# Patient Record
Sex: Female | Born: 1935 | Race: Black or African American | Hispanic: No | Marital: Single | Smoking: Former smoker
Health system: Southern US, Community
[De-identification: ages and names within clinical notes are randomized; demographics above are authoritative.]

## PROBLEM LIST (undated history)

## (undated) DIAGNOSIS — I10 Essential (primary) hypertension: Secondary | ICD-10-CM

## (undated) DIAGNOSIS — G8929 Other chronic pain: Secondary | ICD-10-CM

## (undated) DIAGNOSIS — E119 Type 2 diabetes mellitus without complications: Secondary | ICD-10-CM

## (undated) DIAGNOSIS — M109 Gout, unspecified: Secondary | ICD-10-CM

## (undated) DIAGNOSIS — M549 Dorsalgia, unspecified: Secondary | ICD-10-CM

## (undated) DIAGNOSIS — I219 Acute myocardial infarction, unspecified: Secondary | ICD-10-CM

## (undated) DIAGNOSIS — R262 Difficulty in walking, not elsewhere classified: Secondary | ICD-10-CM

## (undated) DIAGNOSIS — F259 Schizoaffective disorder, unspecified: Secondary | ICD-10-CM

## (undated) DIAGNOSIS — F319 Bipolar disorder, unspecified: Secondary | ICD-10-CM

## (undated) DIAGNOSIS — IMO0002 Reserved for concepts with insufficient information to code with codable children: Secondary | ICD-10-CM

## (undated) HISTORY — PX: ABDOMINAL HYSTERECTOMY: SHX81

---

## 2016-10-05 ENCOUNTER — Emergency Department (HOSPITAL_COMMUNITY): Payer: Medicare PPO

## 2016-10-05 ENCOUNTER — Encounter (HOSPITAL_COMMUNITY): Payer: Self-pay

## 2016-10-05 ENCOUNTER — Emergency Department (HOSPITAL_COMMUNITY)
Admission: EM | Admit: 2016-10-05 | Discharge: 2016-10-05 | Disposition: A | Discharge status: Home / Self Care | Payer: Medicare PPO | Attending: Emergency Medicine | Admitting: Emergency Medicine

## 2016-10-05 DIAGNOSIS — I10 Essential (primary) hypertension: Secondary | ICD-10-CM | POA: Insufficient documentation

## 2016-10-05 DIAGNOSIS — Z87891 Personal history of nicotine dependence: Secondary | ICD-10-CM | POA: Insufficient documentation

## 2016-10-05 DIAGNOSIS — E875 Hyperkalemia: Secondary | ICD-10-CM | POA: Diagnosis not present

## 2016-10-05 DIAGNOSIS — N390 Urinary tract infection, site not specified: Secondary | ICD-10-CM | POA: Insufficient documentation

## 2016-10-05 DIAGNOSIS — E119 Type 2 diabetes mellitus without complications: Secondary | ICD-10-CM | POA: Diagnosis not present

## 2016-10-05 DIAGNOSIS — J181 Lobar pneumonia, unspecified organism: Secondary | ICD-10-CM | POA: Insufficient documentation

## 2016-10-05 DIAGNOSIS — J189 Pneumonia, unspecified organism: Secondary | ICD-10-CM

## 2016-10-05 DIAGNOSIS — R1030 Lower abdominal pain, unspecified: Secondary | ICD-10-CM

## 2016-10-05 HISTORY — DX: Gout, unspecified: M10.9

## 2016-10-05 HISTORY — DX: Acute myocardial infarction, unspecified: I21.9

## 2016-10-05 HISTORY — DX: Essential (primary) hypertension: I10

## 2016-10-05 HISTORY — DX: Type 2 diabetes mellitus without complications: E11.9

## 2016-10-05 HISTORY — DX: Reserved for concepts with insufficient information to code with codable children: IMO0002

## 2016-10-05 LAB — CBC WITH DIFFERENTIAL/PLATELET
BASOS ABS: 0 10*3/uL (ref 0.0–0.1)
BASOS PCT: 0 %
EOS PCT: 3 %
Eosinophils Absolute: 0.2 10*3/uL (ref 0.0–0.7)
HCT: 32.6 % — ABNORMAL LOW (ref 36.0–46.0)
Hemoglobin: 10.2 g/dL — ABNORMAL LOW (ref 12.0–15.0)
Lymphocytes Relative: 32 %
Lymphs Abs: 1.9 10*3/uL (ref 0.7–4.0)
MCH: 30.4 pg (ref 26.0–34.0)
MCHC: 31.3 g/dL (ref 30.0–36.0)
MCV: 97.3 fL (ref 78.0–100.0)
MONO ABS: 0.4 10*3/uL (ref 0.1–1.0)
Monocytes Relative: 7 %
NEUTROS ABS: 3.5 10*3/uL (ref 1.7–7.7)
Neutrophils Relative %: 58 %
PLATELETS: 202 10*3/uL (ref 150–400)
RBC: 3.35 MIL/uL — ABNORMAL LOW (ref 3.87–5.11)
RDW: 13.4 % (ref 11.5–15.5)
WBC: 6 10*3/uL (ref 4.0–10.5)

## 2016-10-05 LAB — COMPREHENSIVE METABOLIC PANEL
ALT: 17 U/L (ref 14–54)
AST: 13 U/L — AB (ref 15–41)
Albumin: 3.7 g/dL (ref 3.5–5.0)
Alkaline Phosphatase: 126 U/L (ref 38–126)
Anion gap: 5 (ref 5–15)
BILIRUBIN TOTAL: 0.6 mg/dL (ref 0.3–1.2)
BUN: 46 mg/dL — AB (ref 6–20)
CO2: 18 mmol/L — ABNORMAL LOW (ref 22–32)
CREATININE: 2.45 mg/dL — AB (ref 0.44–1.00)
Calcium: 9.2 mg/dL (ref 8.9–10.3)
Chloride: 115 mmol/L — ABNORMAL HIGH (ref 101–111)
GFR, EST AFRICAN AMERICAN: 20 mL/min — AB (ref >60)
GFR, EST NON AFRICAN AMERICAN: 17 mL/min — AB (ref >60)
Glucose, Bld: 148 mg/dL — ABNORMAL HIGH (ref 65–99)
POTASSIUM: 5.5 mmol/L — AB (ref 3.5–5.1)
Sodium: 138 mmol/L (ref 135–145)
TOTAL PROTEIN: 7.1 g/dL (ref 6.5–8.1)

## 2016-10-05 LAB — LIPASE, BLOOD: LIPASE: 31 U/L (ref 11–51)

## 2016-10-05 LAB — URINE MICROSCOPIC-ADD ON: RBC / HPF: NONE SEEN RBC/hpf (ref 0–5)

## 2016-10-05 LAB — URINALYSIS, ROUTINE W REFLEX MICROSCOPIC
BILIRUBIN URINE: NEGATIVE
Glucose, UA: NEGATIVE mg/dL
Hgb urine dipstick: NEGATIVE
KETONES UR: NEGATIVE mg/dL
NITRITE: NEGATIVE
PROTEIN: 100 mg/dL — AB
Specific Gravity, Urine: 1.02 (ref 1.005–1.030)
pH: 6 (ref 5.0–8.0)

## 2016-10-05 LAB — TROPONIN I

## 2016-10-05 MED ORDER — CEPHALEXIN 250 MG PO CAPS: 250.0000 mg | ORAL_CAPSULE | Freq: Three times a day (TID) | ORAL | 0 refills | Status: DC

## 2016-10-05 MED ORDER — AZITHROMYCIN 500 MG IV SOLR
500.0000 mg | Freq: Once | INTRAVENOUS | Status: AC
  Administered 2016-10-05: 500 mg via INTRAVENOUS
  Filled 2016-10-05: qty 500

## 2016-10-05 MED ORDER — DEXTROSE 5 % IV SOLN
1.0000 g | Freq: Once | INTRAVENOUS | Status: AC
  Administered 2016-10-05: 1 g via INTRAVENOUS
  Filled 2016-10-05: qty 10

## 2016-10-05 MED ORDER — SODIUM CHLORIDE 0.9 % IV BOLUS (SEPSIS)
1000.0000 mL | Freq: Once | INTRAVENOUS | Status: AC
  Administered 2016-10-05: 1000 mL via INTRAVENOUS

## 2016-10-05 MED ORDER — MORPHINE SULFATE (PF) 4 MG/ML IV SOLN
4.0000 mg | Freq: Once | INTRAVENOUS | Status: AC
  Administered 2016-10-05: 4 mg via INTRAVENOUS
  Filled 2016-10-05: qty 1

## 2016-10-05 MED ORDER — ONDANSETRON HCL 4 MG/2ML IJ SOLN
4.0000 mg | Freq: Once | INTRAMUSCULAR | Status: AC
  Administered 2016-10-05: 4 mg via INTRAVENOUS
  Filled 2016-10-05: qty 2

## 2016-10-05 MED ORDER — AZITHROMYCIN 250 MG PO TABS: 250.0000 mg | ORAL_TABLET | Freq: Every day | ORAL | 0 refills | Status: DC

## 2016-10-05 MED ORDER — HYDROCODONE-ACETAMINOPHEN 5-325 MG PO TABS: 1.0000 | ORAL_TABLET | ORAL | 0 refills | Status: DC | PRN

## 2016-10-05 MED ORDER — IOPAMIDOL (ISOVUE-300) INJECTION 61%
INTRAVENOUS | Status: AC
  Filled 2016-10-05: qty 30

## 2016-10-05 NOTE — ED Notes (Signed)
EKG given to Dr. Goldston  

## 2016-10-05 NOTE — ED Triage Notes (Signed)
Pt sent from [facility] due to lower abdominal pain that started last night. Pt complains of N/V/D. Also diagnosed with strep throat. Pain has radiated to left chest

## 2016-10-05 NOTE — ED Notes (Signed)
Pt to CT at this time.

## 2016-10-05 NOTE — ED Provider Notes (Signed)
[place] DEPT Provider Note   CSN: [id number] Arrival date & time: 10/05/16  1419     History   Chief Complaint Chief Complaint  Patient presents with  . Abdominal Pain    HPI Kristen Kaufman is a 82 y.o. female.  HPI  82 year old female with a history of diabetes and hypertension presents with lower abdominal pain since last night. When she woke up yesterday she was feeling fine but throughout the day started having mild lower abdominal pain. Her left-sided became severe and has continued throughout the day today. Went to an urgent care in [place] where they drew labs and did an x-ray with a set there was something abnormal on her right side. Center here for evaluation. She states she's also had an intermittently productive cough for about one week with fevers. She denies sore throat but states that they told her her strep test was positive. She has had nausea with one episode of vomiting. Multiple episodes of loose watery stool. No urinary symptoms. Occasionally when the pain is bad it radiates from her abdomen up to her chest, no pain there now. Starts on left side and wraps around anterior abdomen  Past Medical History:  Diagnosis Date  . Diabetes mellitus without complication ([place])   . Gout   . Hypertension   . Myocardial infarct   . Ulcer ([place])    stomach    There are no active problems to display for this patient.   Past Surgical History:  Procedure Laterality Date  . ABDOMINAL HYSTERECTOMY      OB History    No data available       Home Medications    Prior to Admission medications   Medication Sig Start Date End Date Taking? Authorizing Provider  LANTUS 100 UNIT/ML injection Inject 50 Units into the skin at bedtime.  09/28/16  Yes Historical Provider, MD  naproxen sodium (ANAPROX) 220 MG tablet Take 220 mg by mouth daily as needed (pain).   Yes Historical Provider, MD  azithromycin (ZITHROMAX) 250 MG tablet Take 1 tablet (250 mg total) by mouth daily.  Take first 2 tablets together, then 1 every day until finished. 10/05/16   Sherwood Gambler, MD  cephALEXin (KEFLEX) 250 MG capsule Take 1 capsule (250 mg total) by mouth 3 (three) times daily. 10/05/16   Sherwood Gambler, MD  HYDROcodone-acetaminophen (NORCO) 5-325 MG tablet Take 1-2 tablets by mouth every 4 (four) hours as needed. 10/05/16   Sherwood Gambler, MD    Family History No family history on file.  Social History Social History  Substance Use Topics  . Smoking status: Former Smoker    Quit date: 10/05/2006  . Smokeless tobacco: Never Used  . Alcohol use No     Allergies   Menactra [meningococcal a c y&w-135 conj]   Review of Systems Review of Systems  Constitutional: Positive for fever.  Respiratory: Positive for cough.   Cardiovascular: Positive for chest pain.  Gastrointestinal: Positive for abdominal pain, diarrhea, nausea and vomiting. Negative for blood in stool.  Genitourinary: Negative for dysuria.  Musculoskeletal: Positive for back pain (chronic).  All other systems reviewed and are negative.    Physical Exam Updated Vital Signs BP 163/78 (BP Location: Right Arm)   Pulse 64   Temp 97.4 F (36.3 C) (Oral)   Resp 16   Ht 5\' 1"  (1.549 m)   Wt 180 lb (81.6 kg)   SpO2 98%   BMI 34.01 kg/m   Physical Exam  Constitutional: She is  oriented to person, place, and time. She appears well-developed and well-nourished. No distress.  Appears in pain  HENT:  Head: Normocephalic and atraumatic.  Right Ear: External ear normal.  Left Ear: External ear normal.  Nose: Nose normal.  Eyes: Right eye exhibits no discharge. Left eye exhibits no discharge.  Cardiovascular: Normal rate, regular rhythm and normal heart sounds.   Pulmonary/Chest: Effort normal and breath sounds normal. She has no wheezes. She has no rales.  Abdominal: Soft. There is tenderness (generalized, mostly LLQ).  Neurological: She is alert and oriented to person, place, and time.  Skin: Skin is  warm and dry. She is not diaphoretic.  Nursing note and vitals reviewed.    ED Treatments / Results  Labs (all labs ordered are listed, but only abnormal results are displayed) Labs Reviewed  COMPREHENSIVE METABOLIC PANEL - Abnormal; Notable for the following:       Result Value   Potassium 5.5 (*)    Chloride 115 (*)    CO2 18 (*)    Glucose, Bld 148 (*)    BUN 46 (*)    Creatinine, Ser 2.45 (*)    AST 13 (*)    GFR calc non Af Amer 17 (*)    GFR calc Af Amer 20 (*)    All other components within normal limits  CBC WITH DIFFERENTIAL/PLATELET - Abnormal; Notable for the following:    RBC 3.35 (*)    Hemoglobin 10.2 (*)    HCT 32.6 (*)    All other components within normal limits  URINALYSIS, ROUTINE W REFLEX MICROSCOPIC (NOT AT [facility]) - Abnormal; Notable for the following:    Protein, ur 100 (*)    Leukocytes, UA TRACE (*)    All other components within normal limits  URINE MICROSCOPIC-ADD ON - Abnormal; Notable for the following:    Squamous Epithelial / LPF 0-5 (*)    Bacteria, UA MANY (*)    All other components within normal limits  LIPASE, BLOOD  TROPONIN I    EKG  EKG Interpretation  Date/Time:  Wednesday October 05 2016 14:29:21 EDT Ventricular Rate:  74 PR Interval:    QRS Duration: 80 QT Interval:  399 QTC Calculation: 443 R Axis:   -16 Text Interpretation:  Sinus rhythm Low voltage, precordial leads Abnormal R-wave progression, early transition Left ventricular hypertrophy No old tracing to compare Confirmed by Abhinav Mayorquin MD, [place] [phone]) on 10/05/2016 2:32:31 PM       Radiology Ct Abdomen Pelvis Wo Contrast  Result Date: 10/05/2016 CLINICAL DATA:  Left lower abdominal pain which began last night. EXAM: CT ABDOMEN AND PELVIS WITHOUT CONTRAST TECHNIQUE: Multidetector CT imaging of the abdomen and pelvis was performed following the standard protocol without IV contrast. COMPARISON:  None. FINDINGS: Lower chest: Mild cardiac enlargement and trace  pericardial effusion. No pleural effusion identified. Hepatobiliary: The liver as a heterogeneous appearance. No focal liver abnormality identified. Gallbladder is normal. No biliary dilatation. Pancreas: Unremarkable. No pancreatic ductal dilatation or surrounding inflammatory changes. Spleen: The spleen is unremarkable. Adrenals/Urinary Tract: Normal appearance of the adrenal glands. Fluid density structure arising from the upper pole of the left kidney is identified measuring 1.5 cm. Although likely representing a simple cysts this is incompletely characterized without IV contrast. No mass or hydronephrosis identified. Gas identified within the nondependent portion of the urinary bladder. This may be related to instrumentation. Stomach/Bowel: The stomach is normal. The small bowel loops have a normal course and caliber. No bowel obstruction. Scattered colonic diverticula  noted. Vascular/Lymphatic: Aortic atherosclerosis noted. No aneurysm. Adjacent to the left renal vein is an indeterminate soft tissue attenuating structure which measures 23 mm and 25 HU. No pelvic or inguinal adenopathy. Reproductive: Status post hysterectomy. No adnexal masses. Other: There is no ascites or focal fluid collections within the abdomen or pelvis. Musculoskeletal: Degenerative disc disease identified within the lumbar spine. No aggressive lytic or sclerotic bone lesions. IMPRESSION: 1. No acute findings identified within the abdomen or pelvis. 2. Indeterminate soft tissue attenuating structure within the left upper quadrant of the abdomen adjacent to the left renal vein. This may represent a soft tissue mass or adenopathy. Further investigation with contrast enhanced MRI or CT of the upper abdomen. 3. Aortic atherosclerosis. 4. Colonic diverticula without acute inflammation. Electronically Signed   By: Kerby Moors M.D.   On: 10/05/2016 17:50   Dg Chest 2 View  Result Date: 10/05/2016 CLINICAL DATA:  Cough for 1 week EXAM:  CHEST  2 VIEW COMPARISON:  None. FINDINGS: Cardiomegaly is noted. No pulmonary edema. There is streaky left base retrocardiac atelectasis or infiltrate best seen on lateral view. Mild degenerative changes thoracic spine. IMPRESSION: No pulmonary edema. Streaky left base retrocardiac atelectasis or infiltrate best seen on lateral view. Degenerative changes thoracic spine. Electronically Signed   By: Lahoma Crocker M.D.   On: 10/05/2016 16:52    Procedures Procedures (including critical care time)  Medications Ordered in ED Medications  iopamidol (ISOVUE-300) 61 % injection (not administered)  sodium chloride 0.9 % bolus 1,000 mL (0 mLs Intravenous Stopped 10/05/16 1705)  ondansetron (ZOFRAN) injection 4 mg (4 mg Intravenous Given 10/05/16 1455)  morphine 4 MG/ML injection 4 mg (4 mg Intravenous Given 10/05/16 1456)  cefTRIAXone (ROCEPHIN) 1 g in dextrose 5 % 50 mL IVPB (0 g Intravenous Stopped 10/05/16 1921)  azithromycin (ZITHROMAX) 500 mg in dextrose 5 % 250 mL IVPB (0 mg Intravenous Stopped 10/05/16 2027)     Initial Impression / Assessment and Plan / ED Course  I have reviewed the triage vital signs and the nursing notes.  Pertinent labs & imaging results that were available during my care of the patient were reviewed by me and considered in my medical decision making (see chart for details).  Clinical Course  Comment By Time  Will get labs, CT, IV morphine, zofran and re-eval. Will need to r/o diverticulitis given LLQ pain. Sherwood Gambler, MD 10/18 1447  Pain is improved with morphine. Much more comfortable. She does not know if she has CKD but I called the urgent care she saw today and they tell me in august her Cr was 2.4 and in May it was 2.4. Sherwood Gambler, MD 10/18 1654    CT is unremarkable for acute pathology. Urine is consistent with UTI and given her lower abdominal pain and subjective fever, will treat with antibiotics. Given her cough and x-ray findings, cover for pneumonia.  While Levaquin could probably cover both, given her renal disease and age I don't think this be the right choice. Thus we'll do 2 different antibiotics. She has mild hyperkalemia of 5.5, it was 5.8 earlier in the day. At this point there are no ECG changes. I think it is reasonable to have her follow-up closely with her PCP for a recheck. She is not on any current medicines that would increase her potassium. Discussed strict return precautions. Her pain is significant only better she feels comfortable going home.  Final Clinical Impressions(s) / ED Diagnoses   Final diagnoses:  Community  acquired pneumonia of left lower lobe of lung ([place])  Acute lower UTI  Lower abdominal pain  Hyperkalemia    New Prescriptions Discharge Medication List as of 10/05/2016  6:21 PM    START taking these medications   Details  azithromycin (ZITHROMAX) 250 MG tablet Take 1 tablet (250 mg total) by mouth daily. Take first 2 tablets together, then 1 every day until finished., Starting Wed 10/05/2016, Print    cephALEXin (KEFLEX) 250 MG capsule Take 1 capsule (250 mg total) by mouth 3 (three) times daily., Starting Wed 10/05/2016, Print    HYDROcodone-acetaminophen (NORCO) 5-325 MG tablet Take 1-2 tablets by mouth every 4 (four) hours as needed., Starting Wed 10/05/2016, Print         Sherwood Gambler, MD 10/05/16 2126

## 2016-10-05 NOTE — ED Notes (Signed)
Pt unable to provide urine specimen.  

## 2017-05-24 ENCOUNTER — Encounter (HOSPITAL_COMMUNITY): Payer: Self-pay | Admitting: Emergency Medicine

## 2017-05-24 ENCOUNTER — Observation Stay (HOSPITAL_COMMUNITY)
Admission: EM | Admit: 2017-05-24 | Discharge: 2017-05-27 | Disposition: A | Discharge status: Home / Self Care | Payer: Medicare PPO | Attending: Internal Medicine | Admitting: Internal Medicine

## 2017-05-24 DIAGNOSIS — D631 Anemia in chronic kidney disease: Secondary | ICD-10-CM | POA: Diagnosis not present

## 2017-05-24 DIAGNOSIS — N184 Chronic kidney disease, stage 4 (severe): Secondary | ICD-10-CM | POA: Diagnosis not present

## 2017-05-24 DIAGNOSIS — E1122 Type 2 diabetes mellitus with diabetic chronic kidney disease: Secondary | ICD-10-CM | POA: Diagnosis not present

## 2017-05-24 DIAGNOSIS — N189 Chronic kidney disease, unspecified: Secondary | ICD-10-CM

## 2017-05-24 DIAGNOSIS — Z79899 Other long term (current) drug therapy: Secondary | ICD-10-CM | POA: Diagnosis not present

## 2017-05-24 DIAGNOSIS — Z794 Long term (current) use of insulin: Secondary | ICD-10-CM | POA: Insufficient documentation

## 2017-05-24 DIAGNOSIS — E1169 Type 2 diabetes mellitus with other specified complication: Secondary | ICD-10-CM | POA: Diagnosis present

## 2017-05-24 DIAGNOSIS — N289 Disorder of kidney and ureter, unspecified: Secondary | ICD-10-CM

## 2017-05-24 DIAGNOSIS — N390 Urinary tract infection, site not specified: Secondary | ICD-10-CM

## 2017-05-24 DIAGNOSIS — I129 Hypertensive chronic kidney disease with stage 1 through stage 4 chronic kidney disease, or unspecified chronic kidney disease: Secondary | ICD-10-CM | POA: Diagnosis not present

## 2017-05-24 DIAGNOSIS — E669 Obesity, unspecified: Secondary | ICD-10-CM

## 2017-05-24 DIAGNOSIS — Z87891 Personal history of nicotine dependence: Secondary | ICD-10-CM | POA: Diagnosis not present

## 2017-05-24 DIAGNOSIS — R799 Abnormal finding of blood chemistry, unspecified: Secondary | ICD-10-CM | POA: Diagnosis present

## 2017-05-24 DIAGNOSIS — E119 Type 2 diabetes mellitus without complications: Secondary | ICD-10-CM | POA: Diagnosis present

## 2017-05-24 DIAGNOSIS — N179 Acute kidney failure, unspecified: Secondary | ICD-10-CM

## 2017-05-24 DIAGNOSIS — R0600 Dyspnea, unspecified: Secondary | ICD-10-CM

## 2017-05-24 DIAGNOSIS — E875 Hyperkalemia: Secondary | ICD-10-CM

## 2017-05-24 DIAGNOSIS — I10 Essential (primary) hypertension: Secondary | ICD-10-CM | POA: Diagnosis present

## 2017-05-24 HISTORY — DX: Dorsalgia, unspecified: M54.9

## 2017-05-24 HISTORY — DX: Other chronic pain: G89.29

## 2017-05-24 LAB — CBC WITH DIFFERENTIAL/PLATELET
BASOS ABS: 0 10*3/uL (ref 0.0–0.1)
Basophils Relative: 0 %
Eosinophils Absolute: 0.2 10*3/uL (ref 0.0–0.7)
Eosinophils Relative: 3 %
HCT: 30.3 % — ABNORMAL LOW (ref 36.0–46.0)
HEMOGLOBIN: 9.8 g/dL — AB (ref 12.0–15.0)
LYMPHS ABS: 2.1 10*3/uL (ref 0.7–4.0)
LYMPHS PCT: 31 %
MCH: 30.9 pg (ref 26.0–34.0)
MCHC: 32.3 g/dL (ref 30.0–36.0)
MCV: 95.6 fL (ref 78.0–100.0)
Monocytes Absolute: 0.6 10*3/uL (ref 0.1–1.0)
Monocytes Relative: 8 %
NEUTROS ABS: 3.9 10*3/uL (ref 1.7–7.7)
Neutrophils Relative %: 58 %
PLATELETS: 258 10*3/uL (ref 150–400)
RBC: 3.17 MIL/uL — ABNORMAL LOW (ref 3.87–5.11)
RDW: 14.3 % (ref 11.5–15.5)
WBC: 6.8 10*3/uL (ref 4.0–10.5)

## 2017-05-24 LAB — URINALYSIS, ROUTINE W REFLEX MICROSCOPIC
Bilirubin Urine: NEGATIVE
GLUCOSE, UA: NEGATIVE mg/dL
KETONES UR: NEGATIVE mg/dL
NITRITE: NEGATIVE
PH: 5 (ref 5.0–8.0)
Protein, ur: 100 mg/dL — AB
Specific Gravity, Urine: 1.011 (ref 1.005–1.030)

## 2017-05-24 LAB — GLUCOSE, CAPILLARY: Glucose-Capillary: 215 mg/dL — ABNORMAL HIGH (ref 65–99)

## 2017-05-24 LAB — BASIC METABOLIC PANEL
ANION GAP: 5 (ref 5–15)
BUN: 51 mg/dL — ABNORMAL HIGH (ref 6–20)
CHLORIDE: 111 mmol/L (ref 101–111)
CO2: 22 mmol/L (ref 22–32)
Calcium: 8.2 mg/dL — ABNORMAL LOW (ref 8.9–10.3)
Creatinine, Ser: 3.18 mg/dL — ABNORMAL HIGH (ref 0.44–1.00)
GFR calc Af Amer: 14 mL/min — ABNORMAL LOW (ref >60)
GFR, EST NON AFRICAN AMERICAN: 12 mL/min — AB (ref >60)
Glucose, Bld: 137 mg/dL — ABNORMAL HIGH (ref 65–99)
POTASSIUM: 5.5 mmol/L — AB (ref 3.5–5.1)
SODIUM: 138 mmol/L (ref 135–145)

## 2017-05-24 LAB — PHOSPHORUS: Phosphorus: 2.6 mg/dL (ref 2.5–4.6)

## 2017-05-24 MED ORDER — CALCIUM CARBONATE ANTACID 1250 MG/5ML PO SUSP
500.0000 mg | Freq: Four times a day (QID) | ORAL | Status: DC | PRN
  Filled 2017-05-24: qty 5

## 2017-05-24 MED ORDER — NEPRO/CARBSTEADY PO LIQD: 237.0000 mL | Freq: Three times a day (TID) | ORAL | Status: DC | PRN

## 2017-05-24 MED ORDER — ONDANSETRON HCL 4 MG PO TABS: 4.0000 mg | ORAL_TABLET | Freq: Four times a day (QID) | ORAL | Status: DC | PRN

## 2017-05-24 MED ORDER — DOCUSATE SODIUM 283 MG RE ENEM
1.0000 | ENEMA | RECTAL | Status: DC | PRN
  Filled 2017-05-24: qty 1

## 2017-05-24 MED ORDER — ZOLPIDEM TARTRATE 5 MG PO TABS: 5.0000 mg | ORAL_TABLET | Freq: Every evening | ORAL | Status: DC | PRN

## 2017-05-24 MED ORDER — METHOCARBAMOL 500 MG PO TABS
500.0000 mg | ORAL_TABLET | Freq: Two times a day (BID) | ORAL | Status: DC | PRN
Start: 2017-05-24 — End: 2017-05-27
  Administered 2017-05-26: 500 mg via ORAL
  Filled 2017-05-24 (×2): qty 1

## 2017-05-24 MED ORDER — HYDROXYZINE HCL 25 MG PO TABS: 25.0000 mg | ORAL_TABLET | Freq: Three times a day (TID) | ORAL | Status: DC | PRN

## 2017-05-24 MED ORDER — HYDRALAZINE HCL 20 MG/ML IJ SOLN
10.0000 mg | Freq: Once | INTRAMUSCULAR | Status: AC
  Administered 2017-05-24: 10 mg via INTRAVENOUS
  Filled 2017-05-24: qty 1

## 2017-05-24 MED ORDER — HYDRALAZINE HCL 20 MG/ML IJ SOLN: 5.0000 mg | INTRAMUSCULAR | Status: DC | PRN

## 2017-05-24 MED ORDER — ACETAMINOPHEN 325 MG PO TABS
650.0000 mg | ORAL_TABLET | Freq: Four times a day (QID) | ORAL | Status: DC | PRN
  Administered 2017-05-25: 650 mg via ORAL
  Filled 2017-05-24: qty 2

## 2017-05-24 MED ORDER — ACETAMINOPHEN 650 MG RE SUPP: 650.0000 mg | Freq: Four times a day (QID) | RECTAL | Status: DC | PRN

## 2017-05-24 MED ORDER — ENOXAPARIN SODIUM 30 MG/0.3ML ~~LOC~~ SOLN
30.0000 mg | SUBCUTANEOUS | Status: DC
  Administered 2017-05-24 – 2017-05-26 (×3): 30 mg via SUBCUTANEOUS
  Filled 2017-05-24 (×3): qty 0.3

## 2017-05-24 MED ORDER — ONDANSETRON HCL 4 MG/2ML IJ SOLN: 4.0000 mg | Freq: Four times a day (QID) | INTRAMUSCULAR | Status: DC | PRN

## 2017-05-24 MED ORDER — SORBITOL 70 % SOLN: 30.0000 mL | Status: DC | PRN

## 2017-05-24 MED ORDER — CAMPHOR-MENTHOL 0.5-0.5 % EX LOTN
1.0000 "application " | TOPICAL_LOTION | Freq: Three times a day (TID) | CUTANEOUS | Status: DC | PRN
  Filled 2017-05-24: qty 222

## 2017-05-24 MED ORDER — SODIUM CHLORIDE 0.9 % IV SOLN
INTRAVENOUS | Status: DC
  Administered 2017-05-24 – 2017-05-27 (×4): via INTRAVENOUS

## 2017-05-24 MED ORDER — DOCUSATE SODIUM 100 MG PO CAPS
100.0000 mg | ORAL_CAPSULE | Freq: Two times a day (BID) | ORAL | Status: DC
  Administered 2017-05-24 – 2017-05-27 (×5): 100 mg via ORAL
  Filled 2017-05-24 (×6): qty 1

## 2017-05-24 MED ORDER — INSULIN ASPART 100 UNIT/ML ~~LOC~~ SOLN
0.0000 [IU] | Freq: Three times a day (TID) | SUBCUTANEOUS | Status: DC
  Administered 2017-05-25: 1 [IU] via SUBCUTANEOUS

## 2017-05-24 MED ORDER — INSULIN GLARGINE 100 UNIT/ML ~~LOC~~ SOLN
50.0000 [IU] | Freq: Every day | SUBCUTANEOUS | Status: DC
  Administered 2017-05-24: 50 [IU] via SUBCUTANEOUS
  Filled 2017-05-24 (×2): qty 0.5

## 2017-05-24 MED ORDER — AMLODIPINE BESYLATE 5 MG PO TABS
10.0000 mg | ORAL_TABLET | Freq: Every day | ORAL | Status: DC
  Administered 2017-05-24 – 2017-05-27 (×4): 10 mg via ORAL
  Filled 2017-05-24 (×4): qty 2

## 2017-05-24 NOTE — ED Notes (Signed)
Call to 3A to give report.  RN will call me back

## 2017-05-24 NOTE — H&P (Addendum)
History and Physical    Kristen Kaufman CBJ:[id number] DOB: 1931/04/22 DOA: 05/24/2017  PCP: Sherrilee Gilles, DO Consultants:  Celine Ahr - orthopedics Patient coming from: home - lives with friend; NOK: friend, [phone]  Chief Complaint: sent by PCP  HPI: Kristen Kaufman is a 83 y.o. female with medical history significant of DM, HTN, and CKD presenting after her PCP sent her in because of elevated potassium and creatinine.  Patient went in for a physical and "she found all this other stuff, something wrong with".  Physical was today and the doctor sent her to the hospital after reviewing her lab work.  The patient preferred not to go to [place] or [place].  "Feeling pretty good - some days I feel good and some days I don't".  Chronic back pain.  SOB only with trying to walk fast.  Radiculopathy down left leg.  Mild LE edema.     ED Course: K+ 5.5, EKG without peaked T waves and unchanged from prior.  Creatinine 3.18 - acute vs. Chronic.  "Decision is made to admit her for optimization of her renal status."  Review of Systems: As per HPI; otherwise review of systems reviewed and negative.   Ambulatory Status:  Ambulates with a cane or walker  Past Medical History:  Diagnosis Date  . Chronic back pain   . Diabetes mellitus without complication ([place])   . Gout   . Hypertension   . Myocardial infarct ([place])   . Ulcer    stomach    Past Surgical History:  Procedure Laterality Date  . ABDOMINAL HYSTERECTOMY      Social History   Social History  . Marital status: Single    Spouse name: N/A  . Number of children: N/A  . Years of education: N/A   Occupational History  . Retired    Social History Main Topics  . Smoking status: Former Smoker    Years: 65.00    Quit date: 10/05/2006  . Smokeless tobacco: Never Used  . Alcohol use Yes     Comment: Quit in 2007 - h/o heavy use  . Drug use: No  . Sexual activity: Not on file   Other Topics Concern  . Not on file   Social History  Narrative  . No narrative on file    Allergies  Allergen Reactions  . Influenza Vaccines Anaphylaxis  . Menactra [Meningococcal A C Y&W-135 Conj]     Family History  Problem Relation Age of Onset  . Chronic Renal Failure Mother 69       required hemodialysis x 6 months    Prior to Admission medications   Medication Sig Start Date End Date Taking? Authorizing Provider  amLODipine (NORVASC) 10 MG tablet Take 10 mg by mouth daily.   Yes [provider]  LANTUS 100 UNIT/ML injection Inject 50 Units into the skin at bedtime.  09/28/16  Yes [provider]  methocarbamol (ROBAXIN) 500 MG tablet Take 500 mg by mouth 2 (two) times daily as needed for muscle spasms.   Yes [provider]    Physical Exam: Vitals:   05/24/17 1922 05/24/17 2021 05/24/17 2039 05/24/17 2050  BP: (!) 169/83   (!) 199/98  Pulse: 91   95  Resp: 20   (!) 24  Temp:    98.3 F (36.8 C)  TempSrc:    Oral  SpO2: 100%   100%  Weight:  83 kg (183 lb) 81.5 kg (179 lb 11.2 oz)   Height:  5'  2" (1.575 m)       General:  Appears calm and comfortable and is NAD Eyes:  PERRL, EOMI, normal lids, iris ENT:  grossly normal hearing, lips & tongue, mmm Neck:  no LAD, masses or thyromegaly Cardiovascular:  RRR, no m/r/g. No LE edema.  Respiratory:  CTA bilaterally, no w/r/r. Normal respiratory effort. Abdomen:  soft, ntnd, NABS Skin:  no rash or induration seen on limited exam Musculoskeletal:  grossly normal tone BUE/BLE, good ROM, no bony abnormality Psychiatric:  grossly normal mood and affect, speech fluent and appropriate, AOx3 Neurologic:  CN 2-12 grossly intact, moves all extremities in coordinated fashion, sensation intact  Labs on Admission: I have personally reviewed following labs and imaging studies  CBC:  Recent Labs Lab 05/24/17 1518  WBC 6.8  NEUTROABS 3.9  HGB 9.8*  HCT 30.3*  MCV 95.6  PLT 798   Basic Metabolic Panel:  Recent Labs Lab 05/24/17 1518  05/24/17 1523  NA 138  --   K 5.5*  --   CL 111  --   CO2 22  --   GLUCOSE 137*  --   BUN 51*  --   CREATININE 3.18*  --   CALCIUM 8.2*  --   PHOS  --  2.6   GFR: Estimated Creatinine Clearance: 13 mL/min (A) (by C-G formula based on SCr of 3.18 mg/dL (H)). Liver Function Tests: No results for input(s): AST, ALT, ALKPHOS, BILITOT, PROT, ALBUMIN in the last 168 hours. No results for input(s): LIPASE, AMYLASE in the last 168 hours. No results for input(s): AMMONIA in the last 168 hours. Coagulation Profile: No results for input(s): INR, PROTIME in the last 168 hours. Cardiac Enzymes: No results for input(s): CKTOTAL, CKMB, CKMBINDEX, TROPONINI in the last 168 hours. BNP (last 3 results) No results for input(s): PROBNP in the last 8760 hours. HbA1C: No results for input(s): HGBA1C in the last 72 hours. CBG: No results for input(s): GLUCAP in the last 168 hours. Lipid Profile: No results for input(s): CHOL, HDL, LDLCALC, TRIG, CHOLHDL, LDLDIRECT in the last 72 hours. Thyroid Function Tests: No results for input(s): TSH, T4TOTAL, FREET4, T3FREE, THYROIDAB in the last 72 hours. Anemia Panel: No results for input(s): VITAMINB12, FOLATE, FERRITIN, TIBC, IRON, RETICCTPCT in the last 72 hours. Urine analysis:    Component Value Date/Time   COLORURINE YELLOW 10/05/2016 1702   APPEARANCEUR CLEAR 10/05/2016 1702   LABSPEC 1.020 10/05/2016 1702   PHURINE 6.0 10/05/2016 1702   GLUCOSEU NEGATIVE 10/05/2016 1702   HGBUR NEGATIVE 10/05/2016 1702   BILIRUBINUR NEGATIVE 10/05/2016 1702   KETONESUR NEGATIVE 10/05/2016 1702   PROTEINUR 100 (A) 10/05/2016 1702   NITRITE NEGATIVE 10/05/2016 1702   LEUKOCYTESUR TRACE (A) 10/05/2016 1702    Creatinine Clearance: Estimated Creatinine Clearance: 13 mL/min (A) (by C-G formula based on SCr of 3.18 mg/dL (H)).  Sepsis Labs: @LABRCNTIP (procalcitonin:4,lacticidven:4) )No results found for this or any previous visit (from the past 240 hour(s)).    Radiological Exams on Admission: No results found.  EKG: pending; per Dr. Roxanne Mins, NSR with rate 75, no sctopy, no evidence of hyperkalemia.  Assessment/Plan Principal Problem:   Renal insufficiency Active Problems:   Diabetes mellitus type 2 in obese [facility])   Essential hypertension   Pertinent labs:  K+ 5.5 Glucose 137 BUN 51/Creatinine 3.18/GFR 12; prior 46/2.45/17 Hgb 9.8; prior 10.2 Phos 2.6  Renal insufficiency -Patient sent in by her PCP after having hyperkalemia (6.0) and renal dysfunction on screening labs in her office today -The  patient actually reports feeling well -In addition to the patient and her friend, I also spoke to the patient's son.  He reports a progressive decline in renal function with repeated warnings from doctors about this issue. -Based on this information, it appears that this is likely progression of CKD to now stage IV and approaching Stage V. -Will observe overnight with IVF to see if there is also an AKI component contributing. -Will monitor on telemetry due to the hyperkalemia despite no reported EKG changes. -Normal phosphorus level. -Appears to have anemia of chronic renal disease, stable. -One of the most important issues will be having the patient and her family decide where to see nephrology.  If possible, it will be best to schedule HD as close to her home as possible, and so it would be most helpful to have nephrology see her near her home.  HTN -Patient is taking Norvasc monotherapy. -She ran out of this medication on 6/2 and has not yet refilled it, which brings about concerns with her compliance. -Will add prn hydralazine IV, consider adding PO hydralazine if needed. -Maximizing HTN control as much as possible is important for maintaining renal function.  DM -Continue Lantus -Cover with SSI -Check A1c    DVT prophylaxis:  Lovenox  Code Status:  DNR - confirmed with patient/family Family Communication: Friend present throughout  encounter; I also spoke at length with the son via telephone  Disposition Plan:  Home once clinically improved Consults called: None  Admission status: It is my clinical opinion that referral for OBSERVATION is reasonable and necessary in this patient based on the above information provided. The aforementioned taken together are felt to place the patient at high risk for further clinical deterioration. However it is anticipated that the patient may be medically stable for discharge from the hospital within 24 to 48 hours.    Karmen Bongo MD [facility]  If 7PM-7AM, please contact night-coverage [email or web address] Password [email or web address]  05/24/2017, 9:02 PM

## 2017-05-24 NOTE — ED Notes (Signed)
Call to 3A, RN to call me back for report

## 2017-05-24 NOTE — ED Provider Notes (Signed)
[facility] DEPT Provider Note   CSN: [id number] Arrival date & time: 05/24/17  1427     History   Chief Complaint Chief Complaint  Patient presents with  . Abnormal Lab    HPI Kristen Kaufman is a 83 y.o. female.  The history is provided by the patient.  She had routine blood draw from her doctor's office this morning. She got a call this afternoon to come in because of abnormal results. Creatinine 3.0, potassium 6.5. She states that she feels fine. She has some mild weakness which is not unusual for her. She denies chest pain, heaviness, tightness, pressure. She denies dyspnea. She denies nausea or vomiting. She denies arthralgias or myalgias. She does have history of diabetes and hypertension. She is on Lantus insulin and an unknown blood pressure medication. As far as she knows, she has never had any kidney problems.  Past Medical History:  Diagnosis Date  . Diabetes mellitus without complication ([place])   . Gout   . Hypertension   . Myocardial infarct ([place])   . Ulcer    stomach    There are no active problems to display for this patient.   Past Surgical History:  Procedure Laterality Date  . ABDOMINAL HYSTERECTOMY      OB History    No data available       Home Medications    Prior to Admission medications   Medication Sig Start Date End Date Taking? Authorizing Provider  azithromycin (ZITHROMAX) 250 MG tablet Take 1 tablet (250 mg total) by mouth daily. Take first 2 tablets together, then 1 every day until finished. 10/05/16   Sherwood Gambler, MD  cephALEXin (KEFLEX) 250 MG capsule Take 1 capsule (250 mg total) by mouth 3 (three) times daily. 10/05/16   Sherwood Gambler, MD  HYDROcodone-acetaminophen (NORCO) 5-325 MG tablet Take 1-2 tablets by mouth every 4 (four) hours as needed. 10/05/16   Sherwood Gambler, MD  LANTUS 100 UNIT/ML injection Inject 50 Units into the skin at bedtime.  09/28/16   [provider]  naproxen sodium (ANAPROX) 220 MG tablet  Take 220 mg by mouth daily as needed (pain).    [provider]    Family History No family history on file.  Social History Social History  Substance Use Topics  . Smoking status: Former Smoker    Quit date: 10/05/2006  . Smokeless tobacco: Never Used  . Alcohol use No     Allergies   Menactra [meningococcal a c y&w-135 conj]   Review of Systems Review of Systems  All other systems reviewed and are negative.    Physical Exam Updated Vital Signs BP (!) 135/91 (BP Location: Left Arm)   Pulse 82   Temp 98.2 F (36.8 C) (Oral)   Resp 18   Ht 5\' 2"  (1.575 m)   Wt 83 kg (183 lb)   SpO2 96%   BMI 33.47 kg/m   Physical Exam  Nursing note and vitals reviewed.  83 year old female, resting comfortably and in no acute distress. Vital signs are significant for borderline hypertension. Oxygen saturation is 96%, which is normal. Head is normocephalic and atraumatic. PERRLA, EOMI. Oropharynx is clear. Neck is nontender and supple without adenopathy or JVD. Back is nontender and there is no CVA tenderness. Lungs are clear without rales, wheezes, or rhonchi. Chest is nontender. Heart has regular rate and rhythm without murmur. Abdomen is soft, flat, nontender without masses or hepatosplenomegaly and peristalsis is normoactive. Extremities have no cyanosis or edema,  full range of motion is present. Skin is warm and dry without rash. Neurologic: Mental status is normal, cranial nerves are intact, there are no motor or sensory deficits.  ED Treatments / Results  Labs (all labs ordered are listed, but only abnormal results are displayed) Labs Reviewed  BASIC METABOLIC PANEL - Abnormal; Notable for the following:       Result Value   Potassium 5.5 (*)    Glucose, Bld 137 (*)    BUN 51 (*)    Creatinine, Ser 3.18 (*)    Calcium 8.2 (*)    GFR calc non Af Amer 12 (*)    GFR calc Af Amer 14 (*)    All other components within normal limits  CBC WITH  DIFFERENTIAL/PLATELET - Abnormal; Notable for the following:    RBC 3.17 (*)    Hemoglobin 9.8 (*)    HCT 30.3 (*)    All other components within normal limits  URINALYSIS, ROUTINE W REFLEX MICROSCOPIC    EKG ECG shows normal sinus rhythm with a rate of 75, no ectopy. Normal axis. Normal P wave. Normal QRS. Normal intervals. Normal ST and T waves. Impression: normal ECG. No ECG evidence of hyperkalemia. When compared with ECG of 10/05/2016, no significant changes are seen.  Procedures Procedures (including critical care time)  Medications Ordered in ED Medications - No data to display   Initial Impression / Assessment and Plan / ED Course  I have reviewed the triage vital signs and the nursing notes.  Pertinent labs & imaging results that were available during my care of the patient were reviewed by me and considered in my medical decision making (see chart for details).  Report of elevated creatinine and potassium on routine blood. Patient states that there was no difficulty in obtaining blood. I have called her doctor's office and found that her her blood pressure medication is amlodipine 10 mg once a day. She also takes methocarbamol as needed. Old records reviewed, and she has an ED visit in October 2017 at which time creatinine was 2.45 and potassium 5.5. I will recheck her laboratory values and obtaining ECG.  ECG shows no signs of hyperkalemia. Potassium checked here is 5.5 and is unchanged from prior potassium obtained in October. Creatinine is up to 3.18 which is a significant rise from October of last year. It is not clear if this is an acute process, or gradual decline related to underlying diabetes and hypertension. I'm concerned about the rapidity of decline in renal function. Decision is made to admit her for optimization of her renal status. Case is discussed with Dr. units [facility] who agrees to admit the patient.  Final Clinical Impressions(s) / ED Diagnoses     Final diagnoses:  Acute renal failure superimposed on stage 4 chronic kidney disease, unspecified acute renal failure type (HCC)  Anemia in stage 4 chronic kidney disease ([place])    New Prescriptions New Prescriptions   No medications on file     Delora Fuel, MD 29/24/46 1756

## 2017-05-24 NOTE — ED Notes (Signed)
Pt was given Renal meal.  No distress at this time.  Visitor remains at the bedside

## 2017-05-24 NOTE — ED Notes (Signed)
Report given to RN on 3A 

## 2017-05-24 NOTE — ED Triage Notes (Signed)
Pt had lab work done in PCP office today. Pt was called with Kt 6.5 and creat 3.0 and told to come to ED, denies in symptoms.

## 2017-05-24 NOTE — ED Notes (Signed)
Admitting MD is at the bedside 

## 2017-05-25 ENCOUNTER — Observation Stay (HOSPITAL_COMMUNITY): Payer: Medicare PPO

## 2017-05-25 ENCOUNTER — Observation Stay (HOSPITAL_BASED_OUTPATIENT_CLINIC_OR_DEPARTMENT_OTHER): Payer: Medicare PPO

## 2017-05-25 DIAGNOSIS — D631 Anemia in chronic kidney disease: Secondary | ICD-10-CM

## 2017-05-25 DIAGNOSIS — E119 Type 2 diabetes mellitus without complications: Secondary | ICD-10-CM

## 2017-05-25 DIAGNOSIS — E1169 Type 2 diabetes mellitus with other specified complication: Secondary | ICD-10-CM

## 2017-05-25 DIAGNOSIS — N179 Acute kidney failure, unspecified: Secondary | ICD-10-CM

## 2017-05-25 DIAGNOSIS — I10 Essential (primary) hypertension: Secondary | ICD-10-CM

## 2017-05-25 DIAGNOSIS — E669 Obesity, unspecified: Secondary | ICD-10-CM

## 2017-05-25 DIAGNOSIS — N039 Chronic nephritic syndrome with unspecified morphologic changes: Secondary | ICD-10-CM

## 2017-05-25 DIAGNOSIS — E875 Hyperkalemia: Secondary | ICD-10-CM

## 2017-05-25 DIAGNOSIS — N184 Chronic kidney disease, stage 4 (severe): Secondary | ICD-10-CM

## 2017-05-25 DIAGNOSIS — N390 Urinary tract infection, site not specified: Secondary | ICD-10-CM

## 2017-05-25 DIAGNOSIS — I342 Nonrheumatic mitral (valve) stenosis: Secondary | ICD-10-CM

## 2017-05-25 DIAGNOSIS — R319 Hematuria, unspecified: Secondary | ICD-10-CM

## 2017-05-25 LAB — CBC
HCT: 32.2 % — ABNORMAL LOW (ref 36.0–46.0)
Hemoglobin: 10.2 g/dL — ABNORMAL LOW (ref 12.0–15.0)
MCH: 30.4 pg (ref 26.0–34.0)
MCHC: 31.7 g/dL (ref 30.0–36.0)
MCV: 95.8 fL (ref 78.0–100.0)
PLATELETS: 262 10*3/uL (ref 150–400)
RBC: 3.36 MIL/uL — ABNORMAL LOW (ref 3.87–5.11)
RDW: 14.4 % (ref 11.5–15.5)
WBC: 7 10*3/uL (ref 4.0–10.5)

## 2017-05-25 LAB — URINALYSIS, COMPLETE (UACMP) WITH MICROSCOPIC
BILIRUBIN URINE: NEGATIVE
Glucose, UA: NEGATIVE mg/dL
Hgb urine dipstick: NEGATIVE
KETONES UR: NEGATIVE mg/dL
Nitrite: NEGATIVE
PH: 6 (ref 5.0–8.0)
Protein, ur: 100 mg/dL — AB
SPECIFIC GRAVITY, URINE: 1.01 (ref 1.005–1.030)

## 2017-05-25 LAB — BASIC METABOLIC PANEL
Anion gap: 7 (ref 5–15)
BUN: 49 mg/dL — ABNORMAL HIGH (ref 6–20)
CALCIUM: 8.8 mg/dL — AB (ref 8.9–10.3)
CO2: 22 mmol/L (ref 22–32)
CREATININE: 2.85 mg/dL — AB (ref 0.44–1.00)
Chloride: 114 mmol/L — ABNORMAL HIGH (ref 101–111)
GFR calc Af Amer: 16 mL/min — ABNORMAL LOW (ref >60)
GFR calc non Af Amer: 14 mL/min — ABNORMAL LOW (ref >60)
GLUCOSE: 140 mg/dL — AB (ref 65–99)
Potassium: 5.5 mmol/L — ABNORMAL HIGH (ref 3.5–5.1)
Sodium: 143 mmol/L (ref 135–145)

## 2017-05-25 LAB — GLUCOSE, CAPILLARY
GLUCOSE-CAPILLARY: 128 mg/dL — AB (ref 65–99)
GLUCOSE-CAPILLARY: 182 mg/dL — AB (ref 65–99)
Glucose-Capillary: 160 mg/dL — ABNORMAL HIGH (ref 65–99)
Glucose-Capillary: 131 mg/dL — ABNORMAL HIGH (ref 65–99)

## 2017-05-25 LAB — VITAMIN B12: VITAMIN B 12: 473 pg/mL (ref 180–914)

## 2017-05-25 LAB — IRON AND TIBC
IRON: 62 ug/dL (ref 28–170)
Saturation Ratios: 23 % (ref 10.4–31.8)
TIBC: 273 ug/dL (ref 250–450)
UIBC: 211 ug/dL

## 2017-05-25 LAB — FOLATE: Folate: 6.2 ng/mL (ref >5.9)

## 2017-05-25 LAB — ECHOCARDIOGRAM COMPLETE
HEIGHTINCHES: 62 in
Weight: 2875.2 oz

## 2017-05-25 LAB — FERRITIN: Ferritin: 149 ng/mL (ref 11–307)

## 2017-05-25 LAB — CK: CK TOTAL: 92 U/L (ref 38–234)

## 2017-05-25 MED ORDER — INSULIN ASPART 100 UNIT/ML ~~LOC~~ SOLN: 0.0000 [IU] | Freq: Every day | SUBCUTANEOUS | Status: DC

## 2017-05-25 MED ORDER — INSULIN ASPART 100 UNIT/ML ~~LOC~~ SOLN
0.0000 [IU] | Freq: Three times a day (TID) | SUBCUTANEOUS | Status: DC
  Administered 2017-05-25: 3 [IU] via SUBCUTANEOUS
  Administered 2017-05-27: 2 [IU] via SUBCUTANEOUS

## 2017-05-25 MED ORDER — INSULIN GLARGINE 100 UNIT/ML ~~LOC~~ SOLN
30.0000 [IU] | Freq: Every day | SUBCUTANEOUS | Status: DC
  Administered 2017-05-25 – 2017-05-26 (×2): 30 [IU] via SUBCUTANEOUS
  Filled 2017-05-25 (×4): qty 0.3

## 2017-05-25 MED ORDER — DEXTROSE 5 % IV SOLN
1.0000 g | INTRAVENOUS | Status: DC
  Administered 2017-05-25 – 2017-05-27 (×3): 1 g via INTRAVENOUS
  Filled 2017-05-25 (×5): qty 10

## 2017-05-25 MED ORDER — SODIUM POLYSTYRENE SULFONATE 15 GM/60ML PO SUSP
30.0000 g | Freq: Once | ORAL | Status: AC
  Administered 2017-05-25: 30 g via ORAL
  Filled 2017-05-25: qty 120

## 2017-05-25 MED ORDER — SODIUM CHLORIDE 0.9 % IV SOLN
INTRAVENOUS | Status: AC
  Administered 2017-05-25: 09:00:00 via INTRAVENOUS

## 2017-05-25 MED ORDER — SODIUM CHLORIDE 0.9 % IV SOLN: INTRAVENOUS | Status: DC

## 2017-05-25 NOTE — Care Management Note (Signed)
Case Management Note  Patient Details  Name: Kristen Kaufman MRN: [id number] Date of Birth: Jan 12, 1931  Subjective/Objective:                  Pt admitted from home, lives with boyfriend and is ind with ADL's. She has aid 5 days a week from 9am-1pm. She uses a cane with ambulation and has a walker if she needs it. She has had HH in the past and does not want any services again. No needs communicated.   Action/Plan: Anticipate discharge home tomorrow with self care. No CM needs, will follow to DC.   Expected Discharge Date:  05/25/17               Expected Discharge Plan:  Home/Self Care  In-House Referral:  NA  Discharge planning Services  CM Consult  Post Acute Care Choice:  NA Choice offered to:  NA  Status of Service:  Completed, signed off  Sherald Barge, RN 05/25/2017, 2:55 PM

## 2017-05-25 NOTE — Progress Notes (Signed)
*  PRELIMINARY RESULTS* Echocardiogram 2D Echocardiogram has been performed.  Kristen Kaufman 05/25/2017, 4:05 PM

## 2017-05-25 NOTE — Progress Notes (Addendum)
PROGRESS NOTE  Kristen Kaufman DPO:[id number] DOB: [birth year] DOA: 05/24/2017 PCP: Sherrilee Gilles, DO  Brief History:  83 year old female with a history of CKD, diabetes mellitus, hypertension presents after being called from her primary care provider's office with abnormal labs showing hyperkalemia and increasing serum creatinine. The patient was noted to have serum creatinine 3.0 with potassium 6.5 at her primary care provider's office. The patient states that she visited her primary care provider or routine visit and was not having an acute problem. However, upon further questioning the patient states that she has had intermittent dyspnea on exertion for the past year that was worse for the past week. However she denied any chest discomfort, dizziness, 60, nausea, vomiting, diarrhea, fevers, chills. She has been complaining of dysuria without hematuria as well as generalized weakness for the past 2 weeks. The patient also complained of decreased oral intake for the past week. She has some nausea without any emesis or abdominal pain. Repeat labs showed a serum creatinine 3.18 with potassium 4.5. As a result, the patient was admitted for further evaluation.  Assessment/Plan: Acute on chronic renal failure--CKD stage IV -In part due to volume depletion -Baseline creatinine appears to be near 2.4 -Continue IV fluids -Repeat morning BMP -renal ultrasound -suspect she may also have a degree of progression of her underlying CKD  UTI -The patient has a clinical syndrome consistent with UTI -Urinalysis--TNTC WBC -send urine culture, then start ceftriaxone  Dyspnea on exertion -Chest x-ray -EKG -Echocardiogram  Diabetes mellitus type 2 -Hemoglobin A1c -Continue reduced dose Lantus -NovoLog sliding scale  Essential hypertension -Continue amlodipine  Hyperkalemia -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 -Kayexalate x 1 -Remain on telemetry -am BMP  Anemia of CKD -Baseline  Hgb ~10 -Check iron/TIBC/ferritin -Check serum B12  -check folate       Disposition Plan:   Home in 1-2 days  Family Communication:  No  Family at bedside  Consultants:  none  Code Status:  FULL   DVT Prophylaxis:  [place] Lovenox   Procedures: As Listed in Progress Note Above  Antibiotics: None    Subjective:   Objective: Vitals:   05/24/17 2039 05/24/17 2050 05/24/17 2119 05/25/17 0453  BP:  (!) 199/98  (!) 121/59  Pulse:  95  82  Resp:  (!) 24  18  Temp:  98.3 F (36.8 C)  98.2 F (36.8 C)  TempSrc:  Oral  Oral  SpO2:  100% 96% 99%  Weight: 81.5 kg (179 lb 11.2 oz)     Height:        Intake/Output Summary (Last 24 hours) at 05/25/17 1023 Last data filed at 05/25/17 0944  Gross per 24 hour  Intake           601.25 ml  Output             1650 ml  Net         -1048.75 ml   Weight change:  Exam:   General:  Pt is alert, follows commands appropriately, not in acute distress  HEENT: No icterus, No thrush, No neck mass, [place]/AT  Cardiovascular: RRR, S1/S2, no rubs, no gallops  Respiratory: CTA bilaterally, no wheezing, no crackles, no rhonchi  Abdomen: Soft/+BS, non tender, non distended, no guarding  Extremities: No edema, No lymphangitis, No petechiae, No rashes, no synovitis   Data Reviewed: I have personally reviewed following labs and imaging studies Basic Metabolic Panel:  Recent  Labs Lab 05/24/17 1518 05/24/17 1523 05/25/17 0622  NA 138  --  143  K 5.5*  --  5.5*  CL 111  --  114*  CO2 22  --  22  GLUCOSE 137*  --  140*  BUN 51*  --  49*  CREATININE 3.18*  --  2.85*  CALCIUM 8.2*  --  8.8*  PHOS  --  2.6  --    Liver Function Tests: No results for input(s): AST, ALT, ALKPHOS, BILITOT, PROT, ALBUMIN in the last 168 hours. No results for input(s): LIPASE, AMYLASE in the last 168 hours. No results for input(s): AMMONIA in the last 168 hours. Coagulation Profile: No results for input(s): INR, PROTIME in the last 168  hours. CBC:  Recent Labs Lab 05/24/17 1518 05/25/17 0622  WBC 6.8 7.0  NEUTROABS 3.9  --   HGB 9.8* 10.2*  HCT 30.3* 32.2*  MCV 95.6 95.8  PLT 258 262   Cardiac Enzymes: No results for input(s): CKTOTAL, CKMB, CKMBINDEX, TROPONINI in the last 168 hours. BNP: Invalid input(s): POCBNP CBG:  Recent Labs Lab 05/24/17 2047 05/25/17 0729  GLUCAP 215* 128*   HbA1C: No results for input(s): HGBA1C in the last 72 hours. Urine analysis:    Component Value Date/Time   COLORURINE YELLOW 05/25/2017 0943   APPEARANCEUR CLOUDY (A) 05/25/2017 0943   LABSPEC 1.010 05/25/2017 0943   PHURINE 6.0 05/25/2017 0943   GLUCOSEU NEGATIVE 05/25/2017 0943   HGBUR NEGATIVE 05/25/2017 0943   BILIRUBINUR NEGATIVE 05/25/2017 0943   KETONESUR NEGATIVE 05/25/2017 0943   PROTEINUR 100 (A) 05/25/2017 0943   NITRITE NEGATIVE 05/25/2017 0943   LEUKOCYTESUR LARGE (A) 05/25/2017 0943   Sepsis Labs: @LABRCNTIP (procalcitonin:4,lacticidven:4) )No results found for this or any previous visit (from the past 240 hour(s)).   Scheduled Meds: . amLODipine  10 mg Oral Daily  . docusate sodium  100 mg Oral BID  . enoxaparin (LOVENOX) injection  30 mg Subcutaneous Q24H  . insulin aspart  0-9 Units Subcutaneous TID WC  . insulin glargine  50 Units Subcutaneous QHS   Continuous Infusions: . sodium chloride 75 mL/hr at 05/24/17 2044  . sodium chloride 75 mL/hr at 05/25/17 0071    Procedures/Studies: No results found.  Alsie Younes, DO  [facility] Pager [phone]  If 7PM-7AM, please contact night-coverage [email or web address] Password [email or web address] 05/25/2017, 10:23 AM   LOS: 0 days

## 2017-05-25 NOTE — Progress Notes (Signed)
I&O cath completed using sterile technique.  350cc of cloudy, pale yellow malodorous urine drained and sent to lab. Patient tolerated well.

## 2017-05-26 DIAGNOSIS — I10 Essential (primary) hypertension: Secondary | ICD-10-CM | POA: Diagnosis not present

## 2017-05-26 DIAGNOSIS — E875 Hyperkalemia: Secondary | ICD-10-CM | POA: Diagnosis not present

## 2017-05-26 DIAGNOSIS — N184 Chronic kidney disease, stage 4 (severe): Secondary | ICD-10-CM | POA: Diagnosis not present

## 2017-05-26 DIAGNOSIS — N179 Acute kidney failure, unspecified: Secondary | ICD-10-CM | POA: Diagnosis not present

## 2017-05-26 LAB — GLUCOSE, CAPILLARY
GLUCOSE-CAPILLARY: 95 mg/dL (ref 65–99)
GLUCOSE-CAPILLARY: 188 mg/dL — AB (ref 65–99)
Glucose-Capillary: 115 mg/dL — ABNORMAL HIGH (ref 65–99)
Glucose-Capillary: 93 mg/dL (ref 65–99)

## 2017-05-26 LAB — BASIC METABOLIC PANEL
Anion gap: 7 (ref 5–15)
BUN: 44 mg/dL — AB (ref 6–20)
CO2: 20 mmol/L — ABNORMAL LOW (ref 22–32)
CREATININE: 2.4 mg/dL — AB (ref 0.44–1.00)
Calcium: 8.1 mg/dL — ABNORMAL LOW (ref 8.9–10.3)
Chloride: 111 mmol/L (ref 101–111)
GFR calc Af Amer: 20 mL/min — ABNORMAL LOW (ref >60)
GFR calc non Af Amer: 17 mL/min — ABNORMAL LOW (ref >60)
Glucose, Bld: 87 mg/dL (ref 65–99)
POTASSIUM: 4.8 mmol/L (ref 3.5–5.1)
SODIUM: 138 mmol/L (ref 135–145)

## 2017-05-26 LAB — HEMOGLOBIN A1C
HEMOGLOBIN A1C: 7.5 % — AB (ref 4.8–5.6)
Mean Plasma Glucose: 169 mg/dL

## 2017-05-26 NOTE — Progress Notes (Signed)
PROGRESS NOTE  Kristen Kaufman CHE:[id number] DOB: [birth year] DOA: 05/24/2017 PCP: Sherrilee Gilles, DO  Brief History:  83 year old female with a history of CKD, diabetes mellitus, hypertension presents after being called from her primary care provider's office with abnormal labs showing hyperkalemia and increasing serum creatinine. The patient was noted to have serum creatinine 3.0 with potassium 6.5 at her primary care provider's office. The patient states that she visited her primary care provider or routine visit and was not having an acute problem. However, upon further questioning the patient states that she has had intermittent dyspnea on exertion for the past year that was worse for the past week. However she denied any chest discomfort, dizziness, 60, nausea, vomiting, diarrhea, fevers, chills. She has been complaining of dysuria without hematuria as well as generalized weakness for the past 2 weeks. The patient also complained of decreased oral intake for the past week. She has some nausea without any emesis or abdominal pain. Repeat labs showed a serum creatinine 3.18 with potassium 4.5. As a result, the patient was admitted for further evaluation.   Assessment/Plan: Acute on chronic renal failure--CKD stage IV -In part due to volume depletion -Baseline creatinine appears to be near 2.4 -Continue IV fluids -improving with IVF -renal ultrasound--medical renal disease; no hydronephrosis -suspect she may also have a degree of progression of her underlying CKD  UTI--[place] -The patient has a clinical syndrome consistent with UTI -Urinalysis--TNTC WBC -continue IV ceftriaxone pending culture data  Dyspnea on exertion -Chest x-ray--personally reviewed--bibasilar scarring -EKG--sinus, nonspecific T wave change--personally reviewed -Echocardiogram--pending  Diabetes mellitus type 2 -Hemoglobin A1c -Continue reduced dose Lantus -NovoLog sliding scale  Essential  hypertension -Continue amlodipine  Hyperkalemia -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 Denies any fevers, chills, chest pain, short of breath, nausea, vomiting, diarrhea, dysuria, hematuria. The patient had some abdominal pain which was improved after a bowel movement.  Objective: Vitals:   05/25/17 1448 05/25/17 2043 05/26/17 0532 05/26/17 1300  BP: (!) 176/82 (!) 148/74 (!) 158/71 (!) 160/79  Pulse: 77 85 75 92  Resp: 20 20 20 20   Temp:  99.4 F (37.4 C) 98.4 F (36.9 C)   TempSrc:  Oral Oral   SpO2: 96% 100% 100% 100%  Weight:      Height:        Intake/Output Summary (Last 24 hours) at 05/26/17 1529 Last data filed at 05/26/17 1100  Gross per 24 hour  Intake           936.25 ml  Output              400 ml  Net           536.25 ml   Weight change:  Exam:   General:  Pt is alert, follows commands appropriately, not in acute distress  HEENT: No icterus, No thrush, No neck mass, [place]/AT  Cardiovascular: RRR, S1/S2, no  rubs, no gallops  Respiratory: Fine bibasilar crackles but no wheezing. Good air movement.  Abdomen: Soft/+BS, non tender, non distended, no guarding  Extremities: No edema, No lymphangitis, No petechiae, No rashes, no synovitis   Data Reviewed: I have personally reviewed following labs and imaging studies Basic Metabolic Panel:  Recent Labs Lab 05/24/17 1518 05/24/17 1523 05/25/17 0622 05/26/17 0521  NA  138  --  143 138  K 5.5*  --  5.5* 4.8  CL 111  --  114* 111  CO2 22  --  22 20*  GLUCOSE 137*  --  140* 87  BUN 51*  --  49* 44*  CREATININE 3.18*  --  2.85* 2.40*  CALCIUM 8.2*  --  8.8* 8.1*  PHOS  --  2.6  --   --    Liver Function Tests: No results for input(s): AST, ALT, ALKPHOS, BILITOT, PROT, ALBUMIN in the last 168 hours. No results for input(s): LIPASE, AMYLASE in the last 168 hours. No results for input(s): AMMONIA in the last 168 hours. Coagulation Profile: No results for input(s): INR, PROTIME in the last 168 hours. CBC:  Recent Labs Lab 05/24/17 1518 05/25/17 0622  WBC 6.8 7.0  NEUTROABS 3.9  --   HGB 9.8* 10.2*  HCT 30.3* 32.2*  MCV 95.6 95.8  PLT 258 262   Cardiac Enzymes:  Recent Labs Lab 05/25/17 1232  CKTOTAL 92   BNP: Invalid input(s): POCBNP CBG:  Recent Labs Lab 05/25/17 1224 05/25/17 1723 05/25/17 2047 05/26/17 0738 05/26/17 1145  GLUCAP 160* 131* 182* 95 115*   HbA1C:  Recent Labs  05/24/17 1518  HGBA1C 7.5*   Urine analysis:    Component Value Date/Time   COLORURINE YELLOW 05/25/2017 0943   APPEARANCEUR CLOUDY (A) 05/25/2017 0943   LABSPEC 1.010 05/25/2017 0943   PHURINE 6.0 05/25/2017 0943   GLUCOSEU NEGATIVE 05/25/2017 0943   HGBUR NEGATIVE 05/25/2017 0943   BILIRUBINUR NEGATIVE 05/25/2017 0943   KETONESUR NEGATIVE 05/25/2017 0943   PROTEINUR 100 (A) 05/25/2017 0943   NITRITE NEGATIVE 05/25/2017 0943   LEUKOCYTESUR LARGE (A) 05/25/2017 0943   Sepsis Labs: @LABRCNTIP (procalcitonin:4,lacticidven:4) ) Recent Results (from the past 240 hour(s))  Culture, Urine     Status: Abnormal (Preliminary result)   Collection Time: 05/25/17  9:43 AM  Result Value Ref Range Status   Specimen Description URINE, CATHETERIZED  Final   Special Requests NONE  Final   Culture >=100,000 COLONIES/mL ESCHERICHIA COLI (A)  Final   Report Status PENDING  Incomplete     Scheduled Meds: . amLODipine  10 mg Oral Daily  . docusate  sodium  100 mg Oral BID  . enoxaparin (LOVENOX) injection  30 mg Subcutaneous Q24H  . insulin aspart  0-15 Units Subcutaneous TID WC  . insulin aspart  0-5 Units Subcutaneous QHS  . insulin glargine  30 Units Subcutaneous QHS   Continuous Infusions: . sodium chloride 75 mL/hr at 05/26/17 0456  . cefTRIAXone (ROCEPHIN)  IV Stopped (05/26/17 9323)    Procedures/Studies: Dg Chest 2 View  Result Date: 05/25/2017 CLINICAL DATA:  Shortness of breath. EXAM: CHEST  2 VIEW COMPARISON:  10/05/2016 FINDINGS: Linear opacities which were also seen previously and consistent with scarring. Chronic cardiomegaly and aortic tortuosity. There is no edema, consolidation, effusion, or pneumothorax. Prominent spondylosis. No acute osseous finding IMPRESSION: 1. No acute finding when compared to prior. 2. Cardiomegaly and bilateral lung scarring. Electronically Signed   By: Monte Fantasia M.D.   On: 05/25/2017 14:22  US Renal  Result Date: 05/25/2017 CLINICAL DATA:  Acute on chronic renal failure. History of diabetes. EXAM: RENAL / URINARY TRACT ULTRASOUND COMPLETE COMPARISON:  Abdominopelvic CT scan of October 05, 2016 FINDINGS: Right Kidney: Length: 9.7 cm. The renal cortical echotexture is increased and is equal to or slightly greater than that of the adjacent liver. There is no hydronephrosis. There is no cystic or solid mass. Left Kidney: Length: 8.8 cm. The renal cortical echotexture is mildly increased similar to that on the right. There is no hydronephrosis. There is a 1.8 x 1.5 x 1.8 cm complex cystic mass in the upper pole cortex. In the lower pole there is a similar-appearing complex cystic structure measuring 0.8 x 0.6 x 0.8 cm. Bladder: The urinary bladder is only partially distended. Ureteral jets were not observed. IMPRESSION: Increased renal cortical echotexture bilaterally consistent with medical renal disease. There is no hydronephrosis. Complex cystic structures in the left kidney as described. These  could be faintly demonstrated on the October 27 CT scan. Noncontrast renal protocol MRI is recommended. Limited visualization of the urinary bladder due to partial distention. Electronically Signed   By: Yeray Tomas  [place] M.D.   On: 05/25/2017 10:38    Endrit Gittins, DO  [facility] Pager [phone]  If 7PM-7AM, please contact night-coverage [email or web address] Password [email or web address] 05/26/2017, 3:29 PM   LOS: 0 days

## 2017-05-27 DIAGNOSIS — E875 Hyperkalemia: Secondary | ICD-10-CM | POA: Diagnosis not present

## 2017-05-27 DIAGNOSIS — N184 Chronic kidney disease, stage 4 (severe): Secondary | ICD-10-CM | POA: Diagnosis not present

## 2017-05-27 DIAGNOSIS — N179 Acute kidney failure, unspecified: Secondary | ICD-10-CM | POA: Diagnosis not present

## 2017-05-27 DIAGNOSIS — I10 Essential (primary) hypertension: Secondary | ICD-10-CM | POA: Diagnosis not present

## 2017-05-27 LAB — GLUCOSE, CAPILLARY
GLUCOSE-CAPILLARY: 81 mg/dL (ref 65–99)
GLUCOSE-CAPILLARY: 137 mg/dL — AB (ref 65–99)

## 2017-05-27 LAB — BASIC METABOLIC PANEL
ANION GAP: 7 (ref 5–15)
BUN: 42 mg/dL — ABNORMAL HIGH (ref 6–20)
CO2: 19 mmol/L — ABNORMAL LOW (ref 22–32)
Calcium: 8.3 mg/dL — ABNORMAL LOW (ref 8.9–10.3)
Chloride: 113 mmol/L — ABNORMAL HIGH (ref 101–111)
Creatinine, Ser: 2.49 mg/dL — ABNORMAL HIGH (ref 0.44–1.00)
GFR, EST AFRICAN AMERICAN: 19 mL/min — AB (ref >60)
GFR, EST NON AFRICAN AMERICAN: 17 mL/min — AB (ref >60)
Glucose, Bld: 80 mg/dL (ref 65–99)
Potassium: 4.5 mmol/L (ref 3.5–5.1)
Sodium: 139 mmol/L (ref 135–145)

## 2017-05-27 LAB — URINE CULTURE: Culture: >=100000 — AB

## 2017-05-27 LAB — HEMOGLOBIN A1C
HEMOGLOBIN A1C: 7.5 % — AB (ref 4.8–5.6)
MEAN PLASMA GLUCOSE: 169 mg/dL

## 2017-05-27 MED ORDER — CEFUROXIME AXETIL 250 MG PO TABS: 250.0000 mg | ORAL_TABLET | Freq: Two times a day (BID) | ORAL | 0 refills | Status: DC

## 2017-05-27 MED ORDER — CEFUROXIME AXETIL 250 MG PO TABS: 250.0000 mg | ORAL_TABLET | Freq: Two times a day (BID) | ORAL | Status: DC

## 2017-05-27 NOTE — Discharge Summary (Signed)
Physician Discharge Summary  Kristen Kaufman POE:[id number] DOB: 24-Feb-1931 DOA: 05/24/2017  PCP: Sherrilee Gilles, DO  Admit date: 05/24/2017 Discharge date: 05/27/2017  Admitted From: Home Disposition:  Home   Recommendations for Outpatient Follow-up:  1. Follow up with PCP in 1-2 weeks 2. Please obtain BMP/CBC in one week   Home Health: Yes Equipment/Devices: HHPT  Discharge Condition: Stable CODE STATUS: DNR Diet recommendation: Heart Healthy   Brief/Interim Summary: 83 year old female with a history of CKD, diabetes mellitus, hypertension presents after being called from her primary care provider's office with abnormal labs showing hyperkalemia and increasing serum creatinine. The patient was noted to have serum creatinine 3.0 with potassium 6.5 at her primary care provider's office. The patient states that she visited her primary care provider or routine visit and was not having an acute problem. However, upon further questioning the patient states that she has had intermittent dyspnea on exertion for the past year that was worse for the past week. However she denied any chest discomfort, dizziness, sob, nausea, vomiting, diarrhea, fevers, chills. She has been complaining of dysuria without hematuria as well as generalized weakness for the past 2 weeks.The patient also complained of decreased oral intake for the past week. She has some nausea without any emesis or abdominal pain. Repeat labs showed a serum creatinine 3.18 with potassium 4.5. As a result, the patient was admitted for further evaluation.  Discharge Diagnoses:  Acute on chronic renal failure--CKD stage IV -In part due to volume depletion -Baseline creatinine appears to be near 2.4 -Continue IV fluids -improving with IVF -renal ultrasound--medical renal disease; no hydronephrosis -suspect she may also have a degree of progression of her underlying CKD -serum creatinine 2.49 on day of d/c  UTI--EColi -The patient has a  clinical syndrome consistent with UTI -Urinalysis--TNTC WBC -continue IV ceftriaxone pending culture data-->home with cefuroxime x 4 days to complete 7 days of tx  Dyspnea on exertion -Chest x-ray--personally reviewed--bibasilar scarring -EKG--sinus, nonspecific T wave change--personally reviewed -05/25/17--Echocardiogram--EF 65-70%, grade 1 DD, no WMA, mild MR -no desaturation with ambulation;  100% on RA at rest  Diabetes mellitus type 2 -05/25/17 Hemoglobin A1c--7.5 -Continue reduced dose Lantus during the hospitalization -NovoLog sliding scale  Essential hypertension -Continue amlodipine  Hyperkalemia -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 -Kayexalate x 1 -Remain on telemetry -repeat BMP one week after d/c  Anemia of CKD -Baseline Hgb ~10 -Check iron/TIBC--iron sat 23%, ferritin 149 -Check serum B12--473 -check folate--6.2   Discharge Instructions  Discharge Instructions    Diet - low sodium heart healthy    Complete by:  As directed    Increase activity slowly    Complete by:  As directed      Allergies as of 05/27/2017      Reactions   Influenza Vaccines Anaphylaxis   Menactra [meningococcal A C Y&w-135 Conj]       Medication List    TAKE these medications   amLODipine 10 MG tablet Commonly known as:  NORVASC Take 10 mg by mouth daily.   cefUROXime 250 MG tablet Commonly known as:  CEFTIN Take 1 tablet (250 mg total) by mouth 2 (two) times daily with a meal.   LANTUS 100 UNIT/ML injection Generic drug:  insulin glargine Inject 50 Units into the skin at bedtime.   methocarbamol 500 MG tablet Commonly known as:  ROBAXIN Take 500 mg by mouth 2 (two) times daily as needed for muscle spasms.       Allergies  Allergen Reactions  .  Influenza Vaccines Anaphylaxis  . Menactra [Meningococcal A C Y&W-135 Conj]     Consultations:  none   Procedures/Studies: Dg Chest 2 View  Result Date: 05/25/2017 CLINICAL DATA:  Shortness of breath. EXAM: CHEST   2 VIEW COMPARISON:  10/05/2016 FINDINGS: Linear opacities which were also seen previously and consistent with scarring. Chronic cardiomegaly and aortic tortuosity. There is no edema, consolidation, effusion, or pneumothorax. Prominent spondylosis. No acute osseous finding IMPRESSION: 1. No acute finding when compared to prior. 2. Cardiomegaly and bilateral lung scarring. Electronically Signed   By: Monte Fantasia M.D.   On: 05/25/2017 14:22   US Renal  Result Date: 05/25/2017 CLINICAL DATA:  Acute on chronic renal failure. History of diabetes. EXAM: RENAL / URINARY TRACT ULTRASOUND COMPLETE COMPARISON:  Abdominopelvic CT scan of October 05, 2016 FINDINGS: Right Kidney: Length: 9.7 cm. The renal cortical echotexture is increased and is equal to or slightly greater than that of the adjacent liver. There is no hydronephrosis. There is no cystic or solid mass. Left Kidney: Length: 8.8 cm. The renal cortical echotexture is mildly increased similar to that on the right. There is no hydronephrosis. There is a 1.8 x 1.5 x 1.8 cm complex cystic mass in the upper pole cortex. In the lower pole there is a similar-appearing complex cystic structure measuring 0.8 x 0.6 x 0.8 cm. Bladder: The urinary bladder is only partially distended. Ureteral jets were not observed. IMPRESSION: Increased renal cortical echotexture bilaterally consistent with medical renal disease. There is no hydronephrosis. Complex cystic structures in the left kidney as described. These could be faintly demonstrated on the October 27 CT scan. Noncontrast renal protocol MRI is recommended. Limited visualization of the urinary bladder due to partial distention. Electronically Signed   By: Konner Warrior  [place] M.D.   On: 05/25/2017 10:38        Discharge Exam: Vitals:   05/26/17 1300 05/26/17 2027  BP: (!) 160/79 (!) 160/67  Pulse: 92 86  Resp: 20 16  Temp:  98.6 F (37 C)   Vitals:   05/26/17 0454 05/26/17 0532 05/26/17 1300 05/26/17 2027  BP:  (!) 142/57 (!) 158/71 (!) 160/79 (!) 160/67  Pulse: 73 75 92 86  Resp: 20 20 20 16   Temp: 98.2 F (36.8 C) 98.4 F (36.9 C)  98.6 F (37 C)  TempSrc: Oral Oral  Oral  SpO2: 99% 100% 100% 100%  Weight:      Height:        General: Pt is alert, awake, not in acute distress Cardiovascular: RRR, S1/S2 +, no rubs, no gallops Respiratory: CTA bilaterally, no wheezing, no rhonchi Abdominal: Soft, NT, ND, bowel sounds + Extremities: no edema, no cyanosis   The results of significant diagnostics from this hospitalization (including imaging, microbiology, ancillary and laboratory) are listed below for reference.    Significant Diagnostic Studies: Dg Chest 2 View  Result Date: 05/25/2017 CLINICAL DATA:  Shortness of breath. EXAM: CHEST  2 VIEW COMPARISON:  10/05/2016 FINDINGS: Linear opacities which were also seen previously and consistent with scarring. Chronic cardiomegaly and aortic tortuosity. There is no edema, consolidation, effusion, or pneumothorax. Prominent spondylosis. No acute osseous finding IMPRESSION: 1. No acute finding when compared to prior. 2. Cardiomegaly and bilateral lung scarring. Electronically Signed   By: Monte Fantasia M.D.   On: 05/25/2017 14:22   US Renal  Result Date: 05/25/2017 CLINICAL DATA:  Acute on chronic renal failure. History of diabetes. EXAM: RENAL / URINARY TRACT ULTRASOUND COMPLETE COMPARISON:  Abdominopelvic CT  scan of October 05, 2016 FINDINGS: Right Kidney: Length: 9.7 cm. The renal cortical echotexture is increased and is equal to or slightly greater than that of the adjacent liver. There is no hydronephrosis. There is no cystic or solid mass. Left Kidney: Length: 8.8 cm. The renal cortical echotexture is mildly increased similar to that on the right. There is no hydronephrosis. There is a 1.8 x 1.5 x 1.8 cm complex cystic mass in the upper pole cortex. In the lower pole there is a similar-appearing complex cystic structure measuring 0.8 x 0.6 x 0.8 cm.  Bladder: The urinary bladder is only partially distended. Ureteral jets were not observed. IMPRESSION: Increased renal cortical echotexture bilaterally consistent with medical renal disease. There is no hydronephrosis. Complex cystic structures in the left kidney as described. These could be faintly demonstrated on the October 27 CT scan. Noncontrast renal protocol MRI is recommended. Limited visualization of the urinary bladder due to partial distention. Electronically Signed   By: Laasya Peyton  [place] M.D.   On: 05/25/2017 10:38     Microbiology: Recent Results (from the past 240 hour(s))  Culture, Urine     Status: Abnormal   Collection Time: 05/25/17  9:43 AM  Result Value Ref Range Status   Specimen Description URINE, CATHETERIZED  Final   Special Requests NONE  Final   Culture >=100,000 COLONIES/mL ESCHERICHIA COLI (A)  Final   Report Status 05/27/2017 FINAL  Final   Organism ID, Bacteria ESCHERICHIA COLI (A)  Final      Susceptibility   Escherichia coli - MIC*    AMPICILLIN <=2 SENSITIVE Sensitive     CEFAZOLIN <=4 SENSITIVE Sensitive     CEFTRIAXONE <=1 SENSITIVE Sensitive     CIPROFLOXACIN <=0.25 SENSITIVE Sensitive     GENTAMICIN <=1 SENSITIVE Sensitive     IMIPENEM <=0.25 SENSITIVE Sensitive     NITROFURANTOIN 64 INTERMEDIATE Intermediate     TRIMETH/SULFA <=20 SENSITIVE Sensitive     AMPICILLIN/SULBACTAM <=2 SENSITIVE Sensitive     PIP/TAZO <=4 SENSITIVE Sensitive     Extended ESBL NEGATIVE Sensitive     * >=100,000 COLONIES/mL ESCHERICHIA COLI     Labs: Basic Metabolic Panel:  Recent Labs Lab 05/24/17 1518 05/24/17 1523 05/25/17 0622 05/26/17 0521 05/27/17 0607  NA 138  --  143 138 139  K 5.5*  --  5.5* 4.8 4.5  CL 111  --  114* 111 113*  CO2 22  --  22 20* 19*  GLUCOSE 137*  --  140* 87 80  BUN 51*  --  49* 44* 42*  CREATININE 3.18*  --  2.85* 2.40* 2.49*  CALCIUM 8.2*  --  8.8* 8.1* 8.3*  PHOS  --  2.6  --   --   --    Liver Function Tests: No results for  input(s): AST, ALT, ALKPHOS, BILITOT, PROT, ALBUMIN in the last 168 hours. No results for input(s): LIPASE, AMYLASE in the last 168 hours. No results for input(s): AMMONIA in the last 168 hours. CBC:  Recent Labs Lab 05/24/17 1518 05/25/17 0622  WBC 6.8 7.0  NEUTROABS 3.9  --   HGB 9.8* 10.2*  HCT 30.3* 32.2*  MCV 95.6 95.8  PLT 258 262   Cardiac Enzymes:  Recent Labs Lab 05/25/17 1232  CKTOTAL 92   BNP: Invalid input(s): POCBNP CBG:  Recent Labs Lab 05/26/17 1145 05/26/17 1618 05/26/17 2031 05/27/17 0739 05/27/17 1118  GLUCAP 115* 93 188* 81 137*    Time coordinating discharge:  Greater than 30  minutes  Signed:  Cathren Sween, DO [facility] Pager: [phone] 05/27/2017, 12:15 PM

## 2017-05-27 NOTE — Evaluation (Signed)
Physical Therapy Evaluation Patient Details Name: Kristen Kaufman MRN: [id number] DOB: Apr 26, 1931 Today's Date: 05/27/2017   History of Present Illness  Kristen Kaufman is a 83 y.o. female with medical history significant of DM, HTN, and CKD presenting after her PCP sent her in because of elevated potassium and creatinine.  Patient went in for a physical and "she found all this other stuff, something wrong with".  Physical was today and the doctor sent her to the hospital after reviewing her lab work.  The patient preferred not to go to [place] or [place].  "Feeling pretty good - some days I feel good and some days I don't".  Chronic back pain.  SOB only with trying to walk fast.  Radiculopathy down left leg.  Mild LE edema.  Clinical Impression  Pt received in bed and was agreeable to PT evaluation. Pt admitted with above diagnosis and is from home where she has 24/7 assistance available, ambulates with SPC or rollator, and required some assistance with dressing and bathing at baseline. Pt mod I with bed mobility, mod I - supervision with transfers and ambulation with SPC. Pt ambulated 71ft with SPC and had no signs of unsteadiness or LOBs. Pt verbalized that this distance and speed is how she was functioning prior to admission and she felt like she was at her baseline level of functioning. At this time, pt is functioning at her PLOF and she does not need any acute or follow-up PT needs. Educated pt to gradually return to her normal routine once she is home and she verbalized understanding. PT signing off.   Follow Up Recommendations No PT follow up    Equipment Recommendations  None recommended by PT    Recommendations for Other Services       Precautions / Restrictions Precautions Precautions: None Precaution Comments: ambulates with SPC and requires assistance for IV pole Restrictions Weight Bearing Restrictions: No      Mobility  Bed Mobility Overal bed mobility: Modified Independent              General bed mobility comments: increased time  Transfers Overall transfer level: Needs assistance Equipment used: Straight cane Transfers: Sit to/from Stand Sit to Stand: Modified independent (Device/Increase time);Supervision            Ambulation/Gait Ambulation/Gait assistance: Modified independent (Device/Increase time);Supervision Ambulation Distance (Feet): 40 Feet Assistive device: Straight cane Gait Pattern/deviations: Step-through pattern;Decreased stride length;Wide base of support   Gait velocity interpretation: <1.8 ft/sec, indicative of risk for recurrent falls General Gait Details: pt able to ambulate 44ft with SPC with mod I to supervision. No signs of unsteadiness or LOBs noted.   Stairs            Wheelchair Mobility    Modified Rankin (Stroke Patients Only)       Balance Overall balance assessment: Needs assistance Sitting-balance support: No upper extremity supported Sitting balance-Leahy Scale: Normal     Standing balance support: Single extremity supported Standing balance-Leahy Scale: Good Standing balance comment: SPC                             Pertinent Vitals/Pain Pain Assessment: No/denies pain    Home Living Family/patient expects to be discharged to:: Private residence Living Arrangements: Spouse/significant other Available Help at Discharge: Available 24 hours/day Type of Home: House       Home Layout: One level Home Equipment: Shower seat;Cane - single point;Walker - 4 wheels  Prior Function Level of Independence: Independent with assistive device(s);Needs assistance   Gait / Transfers Assistance Needed: Pt was independent with SPC  ADL's / Homemaking Assistance Needed: Pt required assistance for bathing and dressing.        Hand Dominance   Dominant Hand: Right    Extremity/Trunk Assessment   Upper Extremity Assessment Upper Extremity Assessment: Overall WFL for tasks  assessed    Lower Extremity Assessment Lower Extremity Assessment: Overall WFL for tasks assessed    Cervical / Trunk Assessment Cervical / Trunk Assessment: Kyphotic  Communication   Communication: No difficulties  Cognition Arousal/Alertness: Awake/alert Behavior During Therapy: WFL for tasks assessed/performed Overall Cognitive Status: Within Functional Limits for tasks assessed                                        General Comments      Exercises     Assessment/Plan    PT Assessment Patent does not need any further PT services  PT Problem List         PT Treatment Interventions      PT Goals (Current goals can be found in the Care Plan section)  Acute Rehab PT Goals Patient Stated Goal: to go home PT Goal Formulation: With patient/family Time For Goal Achievement: 05/28/17 Potential to Achieve Goals: Good    Frequency     Barriers to discharge        Co-evaluation               AM-PAC PT "6 Clicks" Daily Activity  Outcome Measure Difficulty turning over in bed (including adjusting bedclothes, sheets and blankets)?: None Difficulty moving from lying on back to sitting on the side of the bed? : None Difficulty sitting down on and standing up from a chair with arms (e.g., wheelchair, bedside commode, etc,.)?: None Help needed moving to and from a bed to chair (including a wheelchair)?: None Help needed walking in hospital room?: A Little Help needed climbing 3-5 steps with a railing? : A Little 6 Click Score: 22    End of Session Equipment Utilized During Treatment: Gait belt Activity Tolerance: Patient tolerated treatment well;No increased pain Patient left: in chair;with call bell/phone within reach;with family/visitor present Nurse Communication: Mobility status PT Visit Diagnosis: Unsteadiness on feet (R26.81);Muscle weakness (generalized) (M62.81)    Time: [id number] PT Time Calculation (min) (ACUTE ONLY): 18  min   Charges:   PT Evaluation $PT Eval Low Complexity: 1 Procedure PT Treatments $Gait Training: 8-22 mins   PT G Codes:   PT G-Codes **NOT FOR INPATIENT CLASS** Functional Assessment Tool Used: AM-PAC 6 Clicks Basic Mobility;Clinical judgement Functional Limitation: Mobility: Walking and moving around Mobility: Walking and Moving Around Current Status (X4481): At least 1 percent but less than 20 percent impaired, limited or restricted Mobility: Walking and Moving Around Goal Status [phone]): At least 1 percent but less than 20 percent impaired, limited or restricted Mobility: Walking and Moving Around Discharge Status [phone]): At least 1 percent but less than 20 percent impaired, limited or restricted    Geraldine Solar PT, DPT

## 2017-05-30 ENCOUNTER — Encounter (HOSPITAL_COMMUNITY): Payer: Self-pay | Admitting: Emergency Medicine

## 2017-05-30 ENCOUNTER — Observation Stay (HOSPITAL_COMMUNITY): Payer: Medicare PPO

## 2017-05-30 ENCOUNTER — Inpatient Hospital Stay (HOSPITAL_COMMUNITY)
Admission: EM | Admit: 2017-05-30 | Discharge: 2017-06-03 | DRG: 683 | Disposition: A | Discharge status: Home / Self Care | Payer: Medicare PPO | Attending: Internal Medicine | Admitting: Internal Medicine

## 2017-05-30 DIAGNOSIS — G8929 Other chronic pain: Secondary | ICD-10-CM | POA: Diagnosis present

## 2017-05-30 DIAGNOSIS — R3989 Other symptoms and signs involving the genitourinary system: Secondary | ICD-10-CM | POA: Diagnosis present

## 2017-05-30 DIAGNOSIS — R1032 Left lower quadrant pain: Secondary | ICD-10-CM

## 2017-05-30 DIAGNOSIS — E860 Dehydration: Secondary | ICD-10-CM | POA: Diagnosis present

## 2017-05-30 DIAGNOSIS — Z887 Allergy status to serum and vaccine status: Secondary | ICD-10-CM

## 2017-05-30 DIAGNOSIS — E1169 Type 2 diabetes mellitus with other specified complication: Secondary | ICD-10-CM | POA: Diagnosis present

## 2017-05-30 DIAGNOSIS — N179 Acute kidney failure, unspecified: Secondary | ICD-10-CM | POA: Diagnosis present

## 2017-05-30 DIAGNOSIS — N184 Chronic kidney disease, stage 4 (severe): Secondary | ICD-10-CM | POA: Diagnosis present

## 2017-05-30 DIAGNOSIS — N170 Acute kidney failure with tubular necrosis: Principal | ICD-10-CM | POA: Diagnosis present

## 2017-05-30 DIAGNOSIS — Z79899 Other long term (current) drug therapy: Secondary | ICD-10-CM

## 2017-05-30 DIAGNOSIS — Z794 Long term (current) use of insulin: Secondary | ICD-10-CM

## 2017-05-30 DIAGNOSIS — I2510 Atherosclerotic heart disease of native coronary artery without angina pectoris: Secondary | ICD-10-CM | POA: Diagnosis present

## 2017-05-30 DIAGNOSIS — N390 Urinary tract infection, site not specified: Secondary | ICD-10-CM | POA: Diagnosis present

## 2017-05-30 DIAGNOSIS — E1121 Type 2 diabetes mellitus with diabetic nephropathy: Secondary | ICD-10-CM | POA: Diagnosis present

## 2017-05-30 DIAGNOSIS — M79672 Pain in left foot: Secondary | ICD-10-CM | POA: Diagnosis present

## 2017-05-30 DIAGNOSIS — I10 Essential (primary) hypertension: Secondary | ICD-10-CM | POA: Diagnosis present

## 2017-05-30 DIAGNOSIS — E875 Hyperkalemia: Secondary | ICD-10-CM | POA: Diagnosis present

## 2017-05-30 DIAGNOSIS — M109 Gout, unspecified: Secondary | ICD-10-CM | POA: Diagnosis present

## 2017-05-30 DIAGNOSIS — E669 Obesity, unspecified: Secondary | ICD-10-CM | POA: Diagnosis present

## 2017-05-30 DIAGNOSIS — D631 Anemia in chronic kidney disease: Secondary | ICD-10-CM | POA: Diagnosis present

## 2017-05-30 DIAGNOSIS — Z87891 Personal history of nicotine dependence: Secondary | ICD-10-CM

## 2017-05-30 DIAGNOSIS — I252 Old myocardial infarction: Secondary | ICD-10-CM

## 2017-05-30 DIAGNOSIS — Z8711 Personal history of peptic ulcer disease: Secondary | ICD-10-CM

## 2017-05-30 DIAGNOSIS — E1122 Type 2 diabetes mellitus with diabetic chronic kidney disease: Secondary | ICD-10-CM | POA: Diagnosis present

## 2017-05-30 DIAGNOSIS — M899 Disorder of bone, unspecified: Secondary | ICD-10-CM | POA: Diagnosis present

## 2017-05-30 DIAGNOSIS — M549 Dorsalgia, unspecified: Secondary | ICD-10-CM | POA: Diagnosis present

## 2017-05-30 DIAGNOSIS — R938 Abnormal findings on diagnostic imaging of other specified body structures: Secondary | ICD-10-CM | POA: Diagnosis present

## 2017-05-30 DIAGNOSIS — Z66 Do not resuscitate: Secondary | ICD-10-CM | POA: Diagnosis present

## 2017-05-30 DIAGNOSIS — Z6835 Body mass index (BMI) 35.0-35.9, adult: Secondary | ICD-10-CM

## 2017-05-30 DIAGNOSIS — I129 Hypertensive chronic kidney disease with stage 1 through stage 4 chronic kidney disease, or unspecified chronic kidney disease: Secondary | ICD-10-CM | POA: Diagnosis present

## 2017-05-30 LAB — BASIC METABOLIC PANEL
Anion gap: 9 (ref 5–15)
BUN: 56 mg/dL — ABNORMAL HIGH (ref 6–20)
CALCIUM: 8.6 mg/dL — AB (ref 8.9–10.3)
CO2: 20 mmol/L — ABNORMAL LOW (ref 22–32)
Chloride: 108 mmol/L (ref 101–111)
Creatinine, Ser: 3.17 mg/dL — ABNORMAL HIGH (ref 0.44–1.00)
GFR, EST AFRICAN AMERICAN: 14 mL/min — AB (ref >60)
GFR, EST NON AFRICAN AMERICAN: 12 mL/min — AB (ref >60)
Glucose, Bld: 184 mg/dL — ABNORMAL HIGH (ref 65–99)
Potassium: 5.7 mmol/L — ABNORMAL HIGH (ref 3.5–5.1)
Sodium: 137 mmol/L (ref 135–145)

## 2017-05-30 LAB — CBC WITH DIFFERENTIAL/PLATELET
BASOS ABS: 0 10*3/uL (ref 0.0–0.1)
BASOS PCT: 0 %
EOS PCT: 3 %
Eosinophils Absolute: 0.3 10*3/uL (ref 0.0–0.7)
HCT: 30.7 % — ABNORMAL LOW (ref 36.0–46.0)
Hemoglobin: 9.6 g/dL — ABNORMAL LOW (ref 12.0–15.0)
Lymphocytes Relative: 26 %
Lymphs Abs: 2.2 10*3/uL (ref 0.7–4.0)
MCH: 30.3 pg (ref 26.0–34.0)
MCHC: 31.3 g/dL (ref 30.0–36.0)
MCV: 96.8 fL (ref 78.0–100.0)
MONO ABS: 0.5 10*3/uL (ref 0.1–1.0)
Monocytes Relative: 6 %
NEUTROS ABS: 5.3 10*3/uL (ref 1.7–7.7)
Neutrophils Relative %: 65 %
PLATELETS: 282 10*3/uL (ref 150–400)
RBC: 3.17 MIL/uL — ABNORMAL LOW (ref 3.87–5.11)
RDW: 14.2 % (ref 11.5–15.5)
WBC: 8.3 10*3/uL (ref 4.0–10.5)

## 2017-05-30 LAB — URINALYSIS, ROUTINE W REFLEX MICROSCOPIC
Bilirubin Urine: NEGATIVE
GLUCOSE, UA: NEGATIVE mg/dL
HGB URINE DIPSTICK: NEGATIVE
KETONES UR: NEGATIVE mg/dL
LEUKOCYTES UA: NEGATIVE
Nitrite: NEGATIVE
PROTEIN: 100 mg/dL — AB
Specific Gravity, Urine: 1.014 (ref 1.005–1.030)
pH: 5 (ref 5.0–8.0)

## 2017-05-30 LAB — URIC ACID: Uric Acid, Serum: 8.8 mg/dL — ABNORMAL HIGH (ref 2.3–6.6)

## 2017-05-30 LAB — GLUCOSE, CAPILLARY: Glucose-Capillary: 121 mg/dL — ABNORMAL HIGH (ref 65–99)

## 2017-05-30 LAB — PHOSPHORUS: Phosphorus: 4 mg/dL (ref 2.5–4.6)

## 2017-05-30 MED ORDER — AMLODIPINE BESYLATE 5 MG PO TABS
10.0000 mg | ORAL_TABLET | Freq: Every day | ORAL | Status: DC
Start: 2017-05-31 — End: 2017-06-03
  Administered 2017-05-31 – 2017-06-03 (×4): 10 mg via ORAL
  Filled 2017-05-30 (×4): qty 2

## 2017-05-30 MED ORDER — HEPARIN SODIUM (PORCINE) 5000 UNIT/ML IJ SOLN
5000.0000 [IU] | Freq: Three times a day (TID) | INTRAMUSCULAR | Status: DC
  Administered 2017-05-30 – 2017-06-03 (×11): 5000 [IU] via SUBCUTANEOUS
  Filled 2017-05-30 (×11): qty 1

## 2017-05-30 MED ORDER — INSULIN ASPART 100 UNIT/ML ~~LOC~~ SOLN
0.0000 [IU] | Freq: Three times a day (TID) | SUBCUTANEOUS | Status: DC
  Administered 2017-06-01: 2 [IU] via SUBCUTANEOUS

## 2017-05-30 MED ORDER — CEFUROXIME AXETIL 250 MG PO TABS
250.0000 mg | ORAL_TABLET | Freq: Two times a day (BID) | ORAL | Status: DC
  Administered 2017-05-30 – 2017-06-01 (×4): 250 mg via ORAL
  Filled 2017-05-30 (×4): qty 1

## 2017-05-30 MED ORDER — INSULIN GLARGINE 100 UNIT/ML ~~LOC~~ SOLN
50.0000 [IU] | Freq: Every day | SUBCUTANEOUS | Status: DC
  Administered 2017-05-30 – 2017-06-02 (×4): 50 [IU] via SUBCUTANEOUS
  Filled 2017-05-30 (×6): qty 0.5

## 2017-05-30 MED ORDER — SODIUM CHLORIDE 0.9 % IV BOLUS (SEPSIS)
1000.0000 mL | Freq: Once | INTRAVENOUS | Status: AC
  Administered 2017-05-30: 1000 mL via INTRAVENOUS

## 2017-05-30 MED ORDER — SODIUM CHLORIDE 0.45 % IV SOLN
INTRAVENOUS | Status: DC
  Administered 2017-05-30 – 2017-05-31 (×2): via INTRAVENOUS

## 2017-05-30 MED ORDER — METHOCARBAMOL 500 MG PO TABS: 500.0000 mg | ORAL_TABLET | Freq: Two times a day (BID) | ORAL | Status: DC | PRN

## 2017-05-30 MED ORDER — SODIUM POLYSTYRENE SULFONATE 15 GM/60ML PO SUSP
30.0000 g | Freq: Once | ORAL | Status: AC
  Administered 2017-05-30: 30 g via ORAL
  Filled 2017-05-30: qty 120

## 2017-05-30 NOTE — ED Triage Notes (Signed)
Pt sattes she was dc as inpatient Saturday for UTI. Seen pcp today for recheck and sent here due to still having the UTI and has been on oral abx x 3 days. nad

## 2017-05-30 NOTE — ED Notes (Signed)
Attempted IV x  1, infiltrated. CN to assess for IV

## 2017-05-30 NOTE — ED Provider Notes (Signed)
[facility] DEPT Provider Note   CSN: [id number] Arrival date & time: 05/30/17  1515     History   Chief Complaint Chief Complaint  Patient presents with  . Urinary Retention    HPI Kristen Kaufman is a 83 y.o. female.  Patient was seen by her doctor today and lab showed that her kidney function has worsened again. Patient complains of mild weakness    Weakness  Primary symptoms include no focal weakness. This is a recurrent problem. The current episode started 12 to 24 hours ago. The problem has not changed since onset.There was no focality noted. There has been no fever. Pertinent negatives include no shortness of breath, no chest pain and no headaches. There were no medications administered prior to arrival. Associated medical issues do not include trauma.    Past Medical History:  Diagnosis Date  . Chronic back pain   . Diabetes mellitus without complication ([place])   . Gout   . Hypertension   . Myocardial infarct ([place])   . Ulcer    stomach    Patient Active Problem List   Diagnosis Date Noted  . AKI (acute kidney injury) ([place]) 05/30/2017  . UTI (urinary tract infection), uncomplicated 00/76/2263  . UTI (urinary tract infection) 05/25/2017  . Hyperkalemia 05/25/2017  . Acute renal failure superimposed on stage 4 chronic kidney disease ([place])   . Anemia in stage 4 chronic kidney disease ([place])   . Renal insufficiency 05/24/2017  . Diabetes mellitus type 2 in obese ([place]) 05/24/2017  . Essential hypertension 05/24/2017    Past Surgical History:  Procedure Laterality Date  . ABDOMINAL HYSTERECTOMY      OB History    No data available       Home Medications    Prior to Admission medications   Medication Sig Start Date End Date Taking? Authorizing Provider  amLODipine (NORVASC) 10 MG tablet Take 10 mg by mouth daily.    [provider]  cefUROXime (CEFTIN) 250 MG tablet Take 1 tablet (250 mg total) by mouth 2 (two) times daily with a meal. 05/27/17    Tat, Shanon Brow, MD  LANTUS 100 UNIT/ML injection Inject 50 Units into the skin at bedtime.  09/28/16   [provider]  methocarbamol (ROBAXIN) 500 MG tablet Take 500 mg by mouth 2 (two) times daily as needed for muscle spasms.    [provider]    Family History Family History  Problem Relation Age of Onset  . Chronic Renal Failure Mother 39       required hemodialysis x 6 months    Social History Social History  Substance Use Topics  . Smoking status: Former Smoker    Years: 65.00    Quit date: 10/05/2006  . Smokeless tobacco: Never Used  . Alcohol use Yes     Comment: Quit in 2007 - h/o heavy use     Allergies   Influenza vaccines and Menactra [meningococcal a c y&w-135 conj]   Review of Systems Review of Systems  Constitutional: Negative for appetite change and fatigue.  HENT: Negative for congestion, ear discharge and sinus pressure.   Eyes: Negative for discharge.  Respiratory: Negative for cough and shortness of breath.   Cardiovascular: Negative for chest pain.  Gastrointestinal: Negative for abdominal pain and diarrhea.  Genitourinary: Negative for frequency and hematuria.  Musculoskeletal: Negative for back pain.  Skin: Negative for rash.  Neurological: Positive for weakness. Negative for focal weakness, seizures and headaches.  Psychiatric/Behavioral: Negative for hallucinations.  Physical Exam Updated Vital Signs BP (!) 145/64 (BP Location: Right Arm)   Pulse 77   Temp 98.1 F (36.7 C) (Oral)   Resp (!) 21   SpO2 95%   Physical Exam  Constitutional: She is oriented to person, place, and time. She appears well-developed.  HENT:  Head: Normocephalic.  Eyes: Conjunctivae and EOM are normal. No scleral icterus.  Neck: Neck supple. No thyromegaly present.  Cardiovascular: Normal rate and regular rhythm.  Exam reveals no gallop and no friction rub.   No murmur heard. Pulmonary/Chest: No stridor. She has no wheezes. She has no rales.  She exhibits no tenderness.  Abdominal: She exhibits no distension. There is no tenderness. There is no rebound.  Musculoskeletal: Normal range of motion. She exhibits no edema.  Lymphadenopathy:    She has no cervical adenopathy.  Neurological: She is oriented to person, place, and time. She exhibits normal muscle tone. Coordination normal.  Skin: No rash noted. No erythema.  Psychiatric: She has a normal mood and affect. Her behavior is normal.     ED Treatments / Results  Labs (all labs ordered are listed, but only abnormal results are displayed) Labs Reviewed  URINALYSIS, ROUTINE W REFLEX MICROSCOPIC - Abnormal; Notable for the following:       Result Value   Protein, ur 100 (*)    Bacteria, UA RARE (*)    Squamous Epithelial / LPF 0-5 (*)    All other components within normal limits  CBC WITH DIFFERENTIAL/PLATELET - Abnormal; Notable for the following:    RBC 3.17 (*)    Hemoglobin 9.6 (*)    HCT 30.7 (*)    All other components within normal limits  BASIC METABOLIC PANEL - Abnormal; Notable for the following:    Potassium 5.7 (*)    CO2 20 (*)    Glucose, Bld 184 (*)    BUN 56 (*)    Creatinine, Ser 3.17 (*)    Calcium 8.6 (*)    GFR calc non Af Amer 12 (*)    GFR calc Af Amer 14 (*)    All other components within normal limits    EKG  EKG Interpretation None       Radiology No results found.  Procedures Procedures (including critical care time)  Medications Ordered in ED Medications  sodium chloride 0.9 % bolus 1,000 mL (not administered)  sodium polystyrene (KAYEXALATE) 15 GM/60ML suspension 30 g (not administered)     Initial Impression / Assessment and Plan / ED Course  I have reviewed the triage vital signs and the nursing notes.  Pertinent labs & imaging results that were available during my care of the patient were reviewed by me and considered in my medical decision making (see chart for details).     Patient with acute kidney injury.  Hyperkalemia. She'll be admitted for IV fluids and further treatment  Final Clinical Impressions(s) / ED Diagnoses   Final diagnoses:  AKI (acute kidney injury) ([place])    New Prescriptions New Prescriptions   No medications on file     Milton Ferguson, MD 05/30/17 1756

## 2017-05-30 NOTE — ED Notes (Signed)
ED Provider at bedside. 

## 2017-05-30 NOTE — H&P (Signed)
History and Physical    Kristen Kaufman DUK:[id number] DOB: 1931/04/29 DOA: 05/30/2017  PCP: Sherrilee Gilles, DO   Patient coming from: Referred from PCPs office.  I have personally briefly reviewed patient's old medical records in [place]  Chief Complaint: Weakness. (Feels like still having UTI)  HPI: Kristen Kaufman is a 83 y.o. female with medical history significant of chronic back pain, type 2 diabetes (last hemoglobin A1c 7.5% on 05/25/2017), gout, hypertension, CAD, history of MI, PUD, stage IV chronic kidney disease who was recently admitted from 05/24/2017 until 05/27/2017 for acute on chronic renal failure, hyperkalemia, Escherichia coli urinary tract infection and dyspnea who was referred by her PCP to the emergency department after the patient presented today for postdischarge follow-up and stating that she still felt like having UTI.   When evaluated in the emergency department, she complains of subjective fevers, frontal headache, fatigue and malaise, but denies chills, sore throat, productive cough, worsening dyspnea, chest pain, palpitations, dizziness, diaphoresis, pitting edema of the lower extremities. She complains of left foot pain and swelling secondary to gout. She denies nausea, emesis, diarrhea, constipation, melena, hemorrhoids or hematochezia. She complains of worsening chronic left-sided abdominal pain. She denies dysuria, frequency or gross hematuria. However, she states that she has been urinating less volume since she was discharged from the hospital. She states that she drinks about 3 gallons of liquid every week.  ED Course: The patient received 1000 mL of normal saline bolus and 30 g of Kayexalate orally. Urinalysis showed proteinuria 100 mg/dL, rare bacteria, 0-5 RBC/WBC per hpf on microscopic exam. WBC 8.3 with a normal differential, hemoglobin 9.6 g/dL and platelets 282. Sodium 137, potassium 5.7, chloride 108, bicarbonate 20 and anion gap of 9 mmol/L. BUN was  56, creatinine 3.17 and glucose 184 mg/dL. EKG did not show any peaked T waves or major changes since previous one.  I spoke to the patient's son, who told me that he spoke with Dr. Florentina Addison office who asked him to tell us to request an inpatient nephrology consult while she is the hospital.  Review of Systems: As per HPI otherwise 10 point review of systems negative.    Past Medical History:  Diagnosis Date  . Chronic back pain   . Diabetes mellitus without complication ([place])   . Gout   . Hypertension   . Myocardial infarct ([place])   . Ulcer    stomach    Past Surgical History:  Procedure Laterality Date  . ABDOMINAL HYSTERECTOMY       reports that she quit smoking about 10 years ago. She quit after 65.00 years of use. She has never used smokeless tobacco. She reports that she drinks alcohol. She reports that she does not use drugs.  Allergies  Allergen Reactions  . Influenza Vaccines Anaphylaxis  . Menactra [Meningococcal A C Y&W-135 Conj]     Family History  Problem Relation Age of Onset  . Chronic Renal Failure Mother 55       required hemodialysis x 6 months  . Hypertension Mother   . Prostate cancer Brother     Prior to Admission medications   Medication Sig Start Date End Date Taking? Authorizing Provider  amLODipine (NORVASC) 10 MG tablet Take 10 mg by mouth daily.    [provider]  cefUROXime (CEFTIN) 250 MG tablet Take 1 tablet (250 mg total) by mouth 2 (two) times daily with a meal. 05/27/17   Tat, Samanthajo Payano, MD  LANTUS 100 UNIT/ML injection Inject 50 Units  into the skin at bedtime.  09/28/16   [provider]  methocarbamol (ROBAXIN) 500 MG tablet Take 500 mg by mouth 2 (two) times daily as needed for muscle spasms.    [provider]    Physical Exam: Vitals:   05/30/17 1527  BP: (!) 145/64  Pulse: 77  Resp: (!) 21  Temp: 98.1 F (36.7 C)  TempSrc: Oral  SpO2: 95%    Constitutional: NAD, calm, comfortable Eyes: PERRL,  lids and conjunctivae normal ENMT: Mucous membranes are mildly dry. Posterior pharynx clear of any exudate or lesions. Neck: normal, supple, no masses, no thyromegaly Respiratory: clear to auscultation bilaterally, no wheezing, no crackles. Normal respiratory effort. No accessory muscle use.  Cardiovascular: Regular rate and rhythm, no murmurs / rubs / gallops. Mild left foot and ankle edema. 2+ pedal pulses. No carotid bruits.  Abdomen: Nondistended, soft, positive LLQ tenderness, no guarding/rebound/masses palpated. No hepatosplenomegaly. Bowel sounds positive.  Musculoskeletal: no clubbing / cyanosis. Positive left foot and ankle tenderness (which the patient attributes to worsening gout) Good ROM, no contractures. Normal muscle tone.  Skin: no rashes, lesions, ulcers on limited skin exam Neurologic: CN 2-12 grossly intact. Sensation intact, DTR normal. Strength 5/5 in all 4.  Psychiatric: Normal judgment and insight. Alert and oriented x 3. Normal mood.    Labs on Admission: I have personally reviewed following labs and imaging studies  CBC:  Recent Labs Lab 05/24/17 1518 05/25/17 0622 05/30/17 1549  WBC 6.8 7.0 8.3  NEUTROABS 3.9  --  5.3  HGB 9.8* 10.2* 9.6*  HCT 30.3* 32.2* 30.7*  MCV 95.6 95.8 96.8  PLT 258 262 419   Basic Metabolic Panel:  Recent Labs Lab 05/24/17 1518 05/24/17 1523 05/25/17 0622 05/26/17 0521 05/27/17 0607 05/30/17 1549  NA 138  --  143 138 139 137  K 5.5*  --  5.5* 4.8 4.5 5.7*  CL 111  --  114* 111 113* 108  CO2 22  --  22 20* 19* 20*  GLUCOSE 137*  --  140* 87 80 184*  BUN 51*  --  49* 44* 42* 56*  CREATININE 3.18*  --  2.85* 2.40* 2.49* 3.17*  CALCIUM 8.2*  --  8.8* 8.1* 8.3* 8.6*  PHOS  --  2.6  --   --   --   --    GFR: Estimated Creatinine Clearance: 13.1 mL/min (A) (by C-G formula based on SCr of 3.17 mg/dL (H)). Liver Function Tests: No results for input(s): AST, ALT, ALKPHOS, BILITOT, PROT, ALBUMIN in the last 168 hours. No  results for input(s): LIPASE, AMYLASE in the last 168 hours. No results for input(s): AMMONIA in the last 168 hours. Coagulation Profile: No results for input(s): INR, PROTIME in the last 168 hours. Cardiac Enzymes:  Recent Labs Lab 05/25/17 1232  CKTOTAL 92   BNP (last 3 results) No results for input(s): PROBNP in the last 8760 hours. HbA1C: No results for input(s): HGBA1C in the last 72 hours. CBG:  Recent Labs Lab 05/26/17 1145 05/26/17 1618 05/26/17 2031 05/27/17 0739 05/27/17 1118  GLUCAP 115* 93 188* 81 137*   Lipid Profile: No results for input(s): CHOL, HDL, LDLCALC, TRIG, CHOLHDL, LDLDIRECT in the last 72 hours. Thyroid Function Tests: No results for input(s): TSH, T4TOTAL, FREET4, T3FREE, THYROIDAB in the last 72 hours. Anemia Panel: No results for input(s): VITAMINB12, FOLATE, FERRITIN, TIBC, IRON, RETICCTPCT in the last 72 hours. Urine analysis:    Component Value Date/Time   COLORURINE YELLOW 05/30/2017  [place] 05/30/2017 1530   LABSPEC 1.014 05/30/2017 1530   PHURINE 5.0 05/30/2017 [place] 05/30/2017 1530   HGBUR NEGATIVE 05/30/2017 1530   BILIRUBINUR NEGATIVE 05/30/2017 [place] 05/30/2017 1530   PROTEINUR 100 (A) 05/30/2017 1530   NITRITE NEGATIVE 05/30/2017 1530   LEUKOCYTESUR NEGATIVE 05/30/2017 1530    Radiological Exams on Admission: No results found.  EKG: Independently reviewed. Vent. rate 78 BPM PR interval * ms QRS duration 85 ms QT/QTc 420/479 ms P-R-T axes 41 -18 49 Sinus rhythm Borderline left axis deviation Abnormal R-wave progression, early transition  Assessment/Plan Principal Problem:   AKI (acute kidney injury) ([place])   Acute renal failure superimposed on stage 4 chronic kidney disease (HCC) Likely the patient has not been hydrating properly at home. She has received 1 L of normal saline bolus in the emergency department. She had a potassium level of 5.7, but bicarbonate  level was 20, but anion gap was only 9 mmol/L. Will admit to telemetry/observation. Continue IV hydration with half normal saline at 75 ML for 20 hours. Monitor intake and output. Check uric acid. Follow-up renal function and electrolytes. Consult nephrology in a.m.  Active Problems:   Hyperkalemia There are no significant EKG changes.  Received Kayexalate in the emergency department. Follow-up potassium level in the evening and early morning. Nephrology will evaluate tomorrow.    Diabetes mellitus type 2 in obese [facility])   Essential hypertension Continue amlodipine 10 mg by mouth daily. Monitor blood pressure.      Anemia in stage 4 chronic kidney disease (HCC) Iron studies and folate done recently were unremarkable. Monitor hematocrit and hemoglobin. Erythropoietin per nephrology if indicated.    UTI (urinary tract infection), uncomplicated Secondary to quasi pansensitive Escherichia coli (which only shows intermediate sensitivity to nitrofurantoin, but is otherwise sensitive to all other antibiotics). Urinalysis today shows significant improvement when compared to previous with just a few bacteria and WBC/RBC per hpf. Continue cefuroxime 250 mg by mouth twice a day.     Abdominal pain Per patient, this pain has been going on for several months. However she mentions that the pain is more severe today. CT scan abdomen/pelvis without IV contrast in October did not show any acute pathology, but showed diverticulosis. I will repeat today for further evaluation.     DVT prophylaxis: Heparin SQ. Code Status: Full code. Family Communication:  I spoke to her son Kristen Kaufman on the phone. [phone]. Disposition Plan: Admit to telemetry for hyperkalemia treatment and monitoring. Consults called: Dr. Lowanda Foster (nephrology) Admission status: Observation/telemetry.   Reubin Milan MD [facility] Pager [phone].  If 7PM-7AM, please contact  night-coverage [email or web address] Password [facility]  05/30/2017, 5:52 PM

## 2017-05-30 NOTE — ED Notes (Signed)
Patient transported to CT 

## 2017-05-31 DIAGNOSIS — Z66 Do not resuscitate: Secondary | ICD-10-CM | POA: Diagnosis present

## 2017-05-31 DIAGNOSIS — E860 Dehydration: Secondary | ICD-10-CM | POA: Diagnosis present

## 2017-05-31 DIAGNOSIS — M79672 Pain in left foot: Secondary | ICD-10-CM | POA: Diagnosis present

## 2017-05-31 DIAGNOSIS — E1122 Type 2 diabetes mellitus with diabetic chronic kidney disease: Secondary | ICD-10-CM | POA: Diagnosis present

## 2017-05-31 DIAGNOSIS — E669 Obesity, unspecified: Secondary | ICD-10-CM

## 2017-05-31 DIAGNOSIS — E875 Hyperkalemia: Secondary | ICD-10-CM

## 2017-05-31 DIAGNOSIS — M899 Disorder of bone, unspecified: Secondary | ICD-10-CM | POA: Diagnosis present

## 2017-05-31 DIAGNOSIS — R3989 Other symptoms and signs involving the genitourinary system: Secondary | ICD-10-CM | POA: Diagnosis present

## 2017-05-31 DIAGNOSIS — N170 Acute kidney failure with tubular necrosis: Secondary | ICD-10-CM | POA: Diagnosis present

## 2017-05-31 DIAGNOSIS — E1169 Type 2 diabetes mellitus with other specified complication: Secondary | ICD-10-CM

## 2017-05-31 DIAGNOSIS — N184 Chronic kidney disease, stage 4 (severe): Secondary | ICD-10-CM

## 2017-05-31 DIAGNOSIS — Z79899 Other long term (current) drug therapy: Secondary | ICD-10-CM | POA: Diagnosis not present

## 2017-05-31 DIAGNOSIS — Z887 Allergy status to serum and vaccine status: Secondary | ICD-10-CM | POA: Diagnosis not present

## 2017-05-31 DIAGNOSIS — Z8711 Personal history of peptic ulcer disease: Secondary | ICD-10-CM | POA: Diagnosis not present

## 2017-05-31 DIAGNOSIS — R1032 Left lower quadrant pain: Secondary | ICD-10-CM | POA: Diagnosis present

## 2017-05-31 DIAGNOSIS — I10 Essential (primary) hypertension: Secondary | ICD-10-CM | POA: Diagnosis not present

## 2017-05-31 DIAGNOSIS — N179 Acute kidney failure, unspecified: Secondary | ICD-10-CM | POA: Diagnosis not present

## 2017-05-31 DIAGNOSIS — D631 Anemia in chronic kidney disease: Secondary | ICD-10-CM | POA: Diagnosis present

## 2017-05-31 DIAGNOSIS — N390 Urinary tract infection, site not specified: Secondary | ICD-10-CM

## 2017-05-31 DIAGNOSIS — G8929 Other chronic pain: Secondary | ICD-10-CM | POA: Diagnosis present

## 2017-05-31 DIAGNOSIS — I252 Old myocardial infarction: Secondary | ICD-10-CM | POA: Diagnosis not present

## 2017-05-31 DIAGNOSIS — E1121 Type 2 diabetes mellitus with diabetic nephropathy: Secondary | ICD-10-CM | POA: Diagnosis present

## 2017-05-31 DIAGNOSIS — I129 Hypertensive chronic kidney disease with stage 1 through stage 4 chronic kidney disease, or unspecified chronic kidney disease: Secondary | ICD-10-CM | POA: Diagnosis present

## 2017-05-31 DIAGNOSIS — K632 Fistula of intestine: Secondary | ICD-10-CM | POA: Diagnosis not present

## 2017-05-31 DIAGNOSIS — M549 Dorsalgia, unspecified: Secondary | ICD-10-CM | POA: Diagnosis present

## 2017-05-31 DIAGNOSIS — I2510 Atherosclerotic heart disease of native coronary artery without angina pectoris: Secondary | ICD-10-CM | POA: Diagnosis present

## 2017-05-31 DIAGNOSIS — R938 Abnormal findings on diagnostic imaging of other specified body structures: Secondary | ICD-10-CM | POA: Diagnosis present

## 2017-05-31 DIAGNOSIS — M109 Gout, unspecified: Secondary | ICD-10-CM | POA: Diagnosis present

## 2017-05-31 LAB — CBC
HCT: 28 % — ABNORMAL LOW (ref 36.0–46.0)
HEMOGLOBIN: 8.9 g/dL — AB (ref 12.0–15.0)
MCH: 30.6 pg (ref 26.0–34.0)
MCHC: 31.8 g/dL (ref 30.0–36.0)
MCV: 96.2 fL (ref 78.0–100.0)
PLATELETS: 246 10*3/uL (ref 150–400)
RBC: 2.91 MIL/uL — ABNORMAL LOW (ref 3.87–5.11)
RDW: 14.1 % (ref 11.5–15.5)
WBC: 7.1 10*3/uL (ref 4.0–10.5)

## 2017-05-31 LAB — GLUCOSE, CAPILLARY
GLUCOSE-CAPILLARY: 95 mg/dL (ref 65–99)
Glucose-Capillary: 117 mg/dL — ABNORMAL HIGH (ref 65–99)
Glucose-Capillary: 89 mg/dL (ref 65–99)
Glucose-Capillary: 109 mg/dL — ABNORMAL HIGH (ref 65–99)

## 2017-05-31 LAB — COMPREHENSIVE METABOLIC PANEL
ALBUMIN: 3 g/dL — AB (ref 3.5–5.0)
ALK PHOS: 101 U/L (ref 38–126)
ALT: 15 U/L (ref 14–54)
AST: 12 U/L — ABNORMAL LOW (ref 15–41)
Anion gap: 6 (ref 5–15)
BUN: 48 mg/dL — ABNORMAL HIGH (ref 6–20)
CALCIUM: 7.9 mg/dL — AB (ref 8.9–10.3)
CHLORIDE: 111 mmol/L (ref 101–111)
CO2: 18 mmol/L — AB (ref 22–32)
CREATININE: 2.51 mg/dL — AB (ref 0.44–1.00)
GFR calc Af Amer: 19 mL/min — ABNORMAL LOW (ref >60)
GFR calc non Af Amer: 17 mL/min — ABNORMAL LOW (ref >60)
GLUCOSE: 115 mg/dL — AB (ref 65–99)
Potassium: 5.2 mmol/L — ABNORMAL HIGH (ref 3.5–5.1)
SODIUM: 135 mmol/L (ref 135–145)
Total Bilirubin: 0.5 mg/dL (ref 0.3–1.2)
Total Protein: 6.5 g/dL (ref 6.5–8.1)

## 2017-05-31 MED ORDER — SODIUM CHLORIDE 0.9 % IV SOLN
INTRAVENOUS | Status: DC
  Administered 2017-05-31 – 2017-06-02 (×3): via INTRAVENOUS

## 2017-05-31 MED ORDER — COLCHICINE 0.6 MG PO TABS
0.3000 mg | ORAL_TABLET | Freq: Two times a day (BID) | ORAL | Status: AC
  Administered 2017-05-31 – 2017-06-02 (×6): 0.3 mg via ORAL
  Filled 2017-05-31 (×6): qty 1

## 2017-05-31 MED ORDER — SODIUM CHLORIDE 0.9 % IV SOLN
INTRAVENOUS | Status: DC
  Filled 2017-05-31 (×2): qty 1000

## 2017-05-31 NOTE — Consult Note (Signed)
Urology Consult  Referring physician: Dr. Darrick Meigs Reason for referral: recurrent UTI, concern for colovesical fistula  Chief Complaint: dysuria  History of Present Illness: Ms Weider is a 83yo with a hx of DMII, MI, HTN, and Gout who was admitted with ARF and worsening of her gout. She was hospitalized 1 week ago for a UTI and was discharged home on ceftin. Culture grew pan sensitive E coli. When she was initially diagnosed with a UTI she was have dysuria, frequency, urgency, and supapubic pain. That has resolved after 7 days of ceftin. When She was readmitted she underwent CT scan which showed diverticulosis and air in the dome of the bladder. Currently the patient denies any LUTS. No other associated symptoms. She denies incomplete bladder emptying. She denies pneumaturia.  No exacerbating/alleviaitng events. UA from this admission is normal.   Past Medical History:  Diagnosis Date  . Chronic back pain   . Diabetes mellitus without complication ([place])   . Gout   . Hypertension   . Myocardial infarct ([place])   . Ulcer    stomach   Past Surgical History:  Procedure Laterality Date  . ABDOMINAL HYSTERECTOMY      Medications: I have reviewed the patient's current medications. Allergies:  Allergies  Allergen Reactions  . Influenza Vaccines Anaphylaxis  . Menactra [Meningococcal A C Y&W-135 Conj]     Family History  Problem Relation Age of Onset  . Chronic Renal Failure Mother 22       required hemodialysis x 6 months  . Hypertension Mother   . Prostate cancer Brother    Social History:  reports that she quit smoking about 10 years ago. She quit after 65.00 years of use. She has never used smokeless tobacco. She reports that she drinks alcohol. She reports that she does not use drugs.  Review of Systems  Constitutional: Positive for malaise/fatigue.  Gastrointestinal: Positive for nausea.  Musculoskeletal: Positive for joint pain.  Neurological: Positive for weakness.  All other  systems reviewed and are negative.   Physical Exam:  Vital signs in last 24 hours: Temp:  [98.1 F (36.7 C)-98.5 F (36.9 C)] 98.5 F (36.9 C) (06/13 1542) Pulse Rate:  [80-87] 80 (06/13 1542) Resp:  [15-20] 18 (06/13 1542) BP: (116-182)/(59-74) 116/69 (06/13 1542) SpO2:  [95 %-99 %] 99 % (06/13 1542) Weight:  [86.1 kg (189 lb 12.8 oz)] 86.1 kg (189 lb 12.8 oz) (06/12 2104) Physical Exam  Constitutional: She is oriented to person, place, and time. She appears well-developed and well-nourished.  HENT:  Head: Normocephalic and atraumatic.  Eyes: EOM are normal. Pupils are equal, round, and reactive to light.  Neck: Normal range of motion. No thyromegaly present.  Cardiovascular: Normal rate and regular rhythm.   Respiratory: Effort normal. No respiratory distress.  GI: Soft. She exhibits no distension.  Musculoskeletal: Normal range of motion. She exhibits no edema.  Neurological: She is alert and oriented to person, place, and time.  Skin: Skin is warm and dry.  Psychiatric: She has a normal mood and affect. Her behavior is normal. Judgment and thought content normal.    Laboratory Data:  Results for orders placed or performed during the hospital encounter of 05/30/17 (from the past 72 hour(s))  Urinalysis, Routine w reflex microscopic     Status: Abnormal   Collection Time: 05/30/17  3:30 PM  Result Value Ref Range   Color, Urine YELLOW YELLOW   APPearance CLEAR CLEAR   Specific Gravity, Urine 1.014 1.005 - 1.030  pH 5.0 5.0 - 8.0   Glucose, UA NEGATIVE NEGATIVE mg/dL   Hgb urine dipstick NEGATIVE NEGATIVE   Bilirubin Urine NEGATIVE NEGATIVE   Ketones, ur NEGATIVE NEGATIVE mg/dL   Protein, ur 100 (A) NEGATIVE mg/dL   Nitrite NEGATIVE NEGATIVE   Leukocytes, UA NEGATIVE NEGATIVE   RBC / HPF 0-5 0 - 5 RBC/hpf   WBC, UA 0-5 0 - 5 WBC/hpf   Bacteria, UA RARE (A) NONE SEEN   Squamous Epithelial / LPF 0-5 (A) NONE SEEN  CBC with Differential     Status: Abnormal    Collection Time: 05/30/17  3:49 PM  Result Value Ref Range   WBC 8.3 4.0 - 10.5 K/uL   RBC 3.17 (L) 3.87 - 5.11 MIL/uL   Hemoglobin 9.6 (L) 12.0 - 15.0 g/dL   HCT 30.7 (L) 36.0 - 46.0 %   MCV 96.8 78.0 - 100.0 fL   MCH 30.3 26.0 - 34.0 pg   MCHC 31.3 30.0 - 36.0 g/dL   RDW 14.2 11.5 - 15.5 %   Platelets 282 150 - 400 K/uL   Neutrophils Relative % 65 %   Neutro Abs 5.3 1.7 - 7.7 K/uL   Lymphocytes Relative 26 %   Lymphs Abs 2.2 0.7 - 4.0 K/uL   Monocytes Relative 6 %   Monocytes Absolute 0.5 0.1 - 1.0 K/uL   Eosinophils Relative 3 %   Eosinophils Absolute 0.3 0.0 - 0.7 K/uL   Basophils Relative 0 %   Basophils Absolute 0.0 0.0 - 0.1 K/uL  Basic metabolic panel     Status: Abnormal   Collection Time: 05/30/17  3:49 PM  Result Value Ref Range   Sodium 137 135 - 145 mmol/L   Potassium 5.7 (H) 3.5 - 5.1 mmol/L   Chloride 108 101 - 111 mmol/L   CO2 20 (L) 22 - 32 mmol/L   Glucose, Bld 184 (H) 65 - 99 mg/dL   BUN 56 (H) 6 - 20 mg/dL   Creatinine, Ser 3.17 (H) 0.44 - 1.00 mg/dL   Calcium 8.6 (L) 8.9 - 10.3 mg/dL   GFR calc non Af Amer 12 (L) >60 mL/min   GFR calc Af Amer 14 (L) >60 mL/min    Comment: (NOTE) The eGFR has been calculated using the CKD EPI equation. This calculation has not been validated in all clinical situations. eGFR's persistently <60 mL/min signify possible Chronic Kidney Disease.    Anion gap 9 5 - 15  Phosphorus     Status: None   Collection Time: 05/30/17  3:53 PM  Result Value Ref Range   Phosphorus 4.0 2.5 - 4.6 mg/dL  Uric acid     Status: Abnormal   Collection Time: 05/30/17  3:53 PM  Result Value Ref Range   Uric Acid, Serum 8.8 (H) 2.3 - 6.6 mg/dL  Glucose, capillary     Status: Abnormal   Collection Time: 05/30/17  9:03 PM  Result Value Ref Range   Glucose-Capillary 121 (H) 65 - 99 mg/dL   Comment 1 Notify RN    Comment 2 Document in Chart   Comprehensive metabolic panel     Status: Abnormal   Collection Time: 05/31/17  5:55 AM  Result  Value Ref Range   Sodium 135 135 - 145 mmol/L   Potassium 5.2 (H) 3.5 - 5.1 mmol/L   Chloride 111 101 - 111 mmol/L   CO2 18 (L) 22 - 32 mmol/L   Glucose, Bld 115 (H) 65 - 99 mg/dL  BUN 48 (H) 6 - 20 mg/dL   Creatinine, Ser 2.51 (H) 0.44 - 1.00 mg/dL   Calcium 7.9 (L) 8.9 - 10.3 mg/dL   Total Protein 6.5 6.5 - 8.1 g/dL   Albumin 3.0 (L) 3.5 - 5.0 g/dL   AST 12 (L) 15 - 41 U/L   ALT 15 14 - 54 U/L   Alkaline Phosphatase 101 38 - 126 U/L   Total Bilirubin 0.5 0.3 - 1.2 mg/dL   GFR calc non Af Amer 17 (L) >60 mL/min   GFR calc Af Amer 19 (L) >60 mL/min    Comment: (NOTE) The eGFR has been calculated using the CKD EPI equation. This calculation has not been validated in all clinical situations. eGFR's persistently <60 mL/min signify possible Chronic Kidney Disease.    Anion gap 6 5 - 15  CBC     Status: Abnormal   Collection Time: 05/31/17  5:55 AM  Result Value Ref Range   WBC 7.1 4.0 - 10.5 K/uL   RBC 2.91 (L) 3.87 - 5.11 MIL/uL   Hemoglobin 8.9 (L) 12.0 - 15.0 g/dL   HCT 28.0 (L) 36.0 - 46.0 %   MCV 96.2 78.0 - 100.0 fL   MCH 30.6 26.0 - 34.0 pg   MCHC 31.8 30.0 - 36.0 g/dL   RDW 14.1 11.5 - 15.5 %   Platelets 246 150 - 400 K/uL  Glucose, capillary     Status: None   Collection Time: 05/31/17  7:32 AM  Result Value Ref Range   Glucose-Capillary 95 65 - 99 mg/dL  Glucose, capillary     Status: Abnormal   Collection Time: 05/31/17 11:41 AM  Result Value Ref Range   Glucose-Capillary 117 (H) 65 - 99 mg/dL  Glucose, capillary     Status: None   Collection Time: 05/31/17  4:58 PM  Result Value Ref Range   Glucose-Capillary 89 65 - 99 mg/dL   Comment 1 Notify RN    Comment 2 Document in Chart    Recent Results (from the past 240 hour(s))  Culture, Urine     Status: Abnormal   Collection Time: 05/25/17  9:43 AM  Result Value Ref Range Status   Specimen Description URINE, CATHETERIZED  Final   Special Requests NONE  Final   Culture >=100,000 COLONIES/mL ESCHERICHIA  COLI (A)  Final   Report Status 05/27/2017 FINAL  Final   Organism ID, Bacteria ESCHERICHIA COLI (A)  Final      Susceptibility   Escherichia coli - MIC*    AMPICILLIN <=2 SENSITIVE Sensitive     CEFAZOLIN <=4 SENSITIVE Sensitive     CEFTRIAXONE <=1 SENSITIVE Sensitive     CIPROFLOXACIN <=0.25 SENSITIVE Sensitive     GENTAMICIN <=1 SENSITIVE Sensitive     IMIPENEM <=0.25 SENSITIVE Sensitive     NITROFURANTOIN 64 INTERMEDIATE Intermediate     TRIMETH/SULFA <=20 SENSITIVE Sensitive     AMPICILLIN/SULBACTAM <=2 SENSITIVE Sensitive     PIP/TAZO <=4 SENSITIVE Sensitive     Extended ESBL NEGATIVE Sensitive     * >=100,000 COLONIES/mL ESCHERICHIA COLI   Creatinine:  Recent Labs  05/25/17 0622 05/26/17 0521 05/27/17 0607 05/30/17 1549 05/31/17 0555  CREATININE 2.85* 2.40* 2.49* 3.17* 2.51*   Baseline Creatinine: 2  Impression/Assessment:  2XF with complicated UTI with gas forming organism  Plan:  1. vantin 158m BID for 7 days 2. I discussed the causes of pneumaturia including UTI and colovesical fistula. I do not believe the patient has a fistula given  her presentation. I discussed with her family that she needs to have office cystoscopy as part of her workup for recurrent UTI and we will schedule this once she is discharged from the hospital.   Nicolette Bang 05/31/2017, 8:14 PM

## 2017-05-31 NOTE — Consult Note (Signed)
Kristen Kaufman MRN: [id number] DOB/AGE: May 10, 1931 83 y.o. Primary Care Physician:McGee, Apolonio Schneiders, DO Admit date: 05/30/2017 Chief Complaint:  Chief Complaint  Patient presents with  . Urinary Retention   HPI: Pt is a 83 year old female with past medical history significant of  type 2 diabetes mellitus  who was referred by her PCP to the emergency department after the patient presented today for postdischarge follow-up and stating that she still felt like having UTI.   Upon evaluation in the ER pt complained of  Fevers and malaise. NO c/o chills/ sore throat/ productive cough. NO c.o dyspnea/ chest pain/ palpitations NO c/o  Dizziness/ diaphoresis/ NO c/o  edema of the lower extremities.  NO c/o  Nausea/ emesis/ diarrhea/ constipation/ melena/ hemorrhoids or hematochezia.  Pt complained urinating less volume since she was discharged from the hospital. Upon evaluation in ER pt was found to have AKI and was admitted. Pt seen today on 3rd floor.Pt feels better. Pt and family whole concern was about" tell us about her kidneys"     Past Medical History:  Diagnosis Date  . Chronic back pain   . Diabetes mellitus without complication ([place])   . Gout   . Hypertension   . Myocardial infarct ([place])   . Ulcer    stomach        Family History  Problem Relation Age of Onset  . Chronic Renal Failure Mother 22       required hemodialysis x 6 months  . Hypertension Mother   . Prostate cancer Brother     Social History:  reports that she quit smoking about 10 years ago. She quit after 65.00 years of use. She has never used smokeless tobacco. She reports that she drinks alcohol. She reports that she does not use drugs.   Allergies:  Allergies  Allergen Reactions  . Influenza Vaccines Anaphylaxis  . Menactra [Meningococcal A C Y&W-135 Conj]     Medications Prior to Admission  Medication Sig Dispense Refill  . amLODipine (NORVASC) 10 MG tablet Take 10 mg by mouth daily.    .  cefUROXime (CEFTIN) 250 MG tablet Take 1 tablet (250 mg total) by mouth 2 (two) times daily with a meal. 8 tablet 0  . LANTUS 100 UNIT/ML injection Inject 50 Units into the skin at bedtime.   5  . methocarbamol (ROBAXIN) 500 MG tablet Take 500 mg by mouth 2 (two) times daily as needed for muscle spasms.         BCW:UGQBV from the symptoms mentioned above,there are no other symptoms referable to all systems reviewed.  Marland Kitchen amLODipine  10 mg Oral Daily  . cefUROXime  250 mg Oral BID WC  . colchicine  0.3 mg Oral BID  . heparin  5,000 Units Subcutaneous Q8H  . insulin aspart  0-15 Units Subcutaneous TID WC  . insulin glargine  50 Units Subcutaneous QHS       Physical Exam: Vital signs in last 24 hours: Temp:  [98.1 F (36.7 C)] 98.1 F (36.7 C) (06/12 2104) Pulse Rate:  [77-89] 87 (06/13 0408) Resp:  [15-27] 20 (06/13 0408) BP: (136-182)/(59-97) 139/59 (06/13 0408) SpO2:  [92 %-99 %] 96 % (06/13 0408) Weight:  [189 lb 12.8 oz (86.1 kg)] 189 lb 12.8 oz (86.1 kg) (06/12 2104) Weight change:  Last BM Date: 05/30/17  Intake/Output from previous day: 06/12 0701 - 06/13 0700 In: 392.5 [I.V.:392.5] Out: 100 [Urine:100] Total I/O In: 120 [P.O.:120] Out: 150 [Urine:150]   Physical Exam: General-  pt is awake,alert, oriented to time place and person Resp- No acute REsp distress, NO Rhonchi CVS- S1S2 regular in rate and rhythm GIT- BS+, soft, NT, ND EXT- NO LE Edema, Cyanosis CNS- CN 2-12 grossly intact. Moving all 4 extremities Psych- normal mood and affect    Lab Results: CBC  Recent Labs  05/30/17 1549 05/31/17 0555  WBC 8.3 7.1  HGB 9.6* 8.9*  HCT 30.7* 28.0*  PLT 282 246    BMET  Recent Labs  05/30/17 1549 05/31/17 0555  NA 137 135  K 5.7* 5.2*  CL 108 111  CO2 20* 18*  GLUCOSE 184* 115*  BUN 56* 48*  CREATININE 3.17* 2.51*  CALCIUM 8.6* 7.9*   Creat trend 2018  3.1=> 2.5       Early June 2.8--3.1 2017  2.45  MICRO Recent Results (from the  past 240 hour(s))  Culture, Urine     Status: Abnormal   Collection Time: 05/25/17  9:43 AM  Result Value Ref Range Status   Specimen Description URINE, CATHETERIZED  Final   Special Requests NONE  Final   Culture >=100,000 COLONIES/mL ESCHERICHIA COLI (A)  Final   Report Status 05/27/2017 FINAL  Final   Organism ID, Bacteria ESCHERICHIA COLI (A)  Final      Susceptibility   Escherichia coli - MIC*    AMPICILLIN <=2 SENSITIVE Sensitive     CEFAZOLIN <=4 SENSITIVE Sensitive     CEFTRIAXONE <=1 SENSITIVE Sensitive     CIPROFLOXACIN <=0.25 SENSITIVE Sensitive     GENTAMICIN <=1 SENSITIVE Sensitive     IMIPENEM <=0.25 SENSITIVE Sensitive     NITROFURANTOIN 64 INTERMEDIATE Intermediate     TRIMETH/SULFA <=20 SENSITIVE Sensitive     AMPICILLIN/SULBACTAM <=2 SENSITIVE Sensitive     PIP/TAZO <=4 SENSITIVE Sensitive     Extended ESBL NEGATIVE Sensitive     * >=100,000 COLONIES/mL ESCHERICHIA COLI      Lab Results  Component Value Date   CALCIUM 7.9 (L) 05/31/2017   PHOS 4.0 05/30/2017   Alb 3.0 Corrected calcium  7.9+ 0.8=8.7  Renal u/s  Right Kidney:  Length: 9.7 cm.   Left Kidney: 8.8 cm.  There is no hydronephrosis.  There is a1.8 x 1.5 x 1.8 cm complex cystic mass in the upper pole cortex. In the lower pole there is a similar-appearing complex cystic structure measuring 0.8 x 0.6 x 0.8 cm.   Impression: 1)Renal  AKI secondary to prereal/ATN               AKI on CKD               CKD stage 3/4.               CKD since 2017 ( most likely before that)               CKD secondary to DM/HTN/Age asso decline/ Low glomerular mass                Progression of CKD marked with AKI                Proteinura will check  2)HTN    BP stable    3)Anemia HGb at goal (9--11)   4)CKD Mineral-Bone Disorder  Phosphorus at goal. Calcium at goal ( after correcting for low albumin)   5)Gout- hx of Gout Primary MD following  6)Electrolytes Hyperkalemic     Now  better  NOrmonatremic   7)Acid base Co2 at goal  8) ID- admitted with UTI On PO abx   9) Urology- Pt with complex mass, will need urology and also colovesical fistula query.  Plan:  Will continue current care. Pt already has appt with nephrologist on 06/13/17 at [facility]. I had extensive discussion with pt, her husband, her elder son and younger son on phone.  I tried to answer their questions to best of my ability.      BHUTANI,MANPREET S 05/31/2017, 10:14 AM

## 2017-05-31 NOTE — Care Management Note (Signed)
Case Management Note  Patient Details  Name: Kristen Kaufman MRN: [id number] Date of Birth: June 01, 1931  Subjective/Objective:                  Admitted with AKI. Pt recently [place] reports having no symptoms of AKI, followed up with her PCP who sent her to the hospital. She is from home, lives with boyfriend and is ind with ADL's. She has aid 5 days a week from 9am-1pm. She uses a cane with ambulation and has a walker if she needs it. She has had HH in the past and does not want any services again. No needs communicated.    Action/Plan: Plan for DC home with self care. No CM needs. Will follow to DC.   Expected Discharge Date:      06/01/2017            Expected Discharge Plan:  Home/Self Care  In-House Referral:  NA  Discharge planning Services  CM Consult  Post Acute Care Choice:  NA Choice offered to:  Patient  Status of Service:  Completed, signed off Sherald Barge, RN 05/31/2017, 10:30 AM

## 2017-05-31 NOTE — Care Management Obs Status (Signed)
[facility] NOTIFICATION   Patient Details  Name: Kiandra Sanguinetti MRN: [id number] Date of Birth: March 02, 1931   Medicare Observation Status Notification Given:  Yes    Sherald Barge, RN 05/31/2017, 10:29 AM

## 2017-05-31 NOTE — Progress Notes (Addendum)
[facility]  PROGRESS NOTE  Indica Marcott JSE:[id number] DOB: [birth year] DOA: 05/30/2017 PCP: Sherrilee Gilles, DO   Brief HPI:   83 year female with a history of diabetes mellitus, hypertension, gout, CAD, stage IV chronic kidney disease who was recently discharged from hospital on 05/27/2017 after she was treated for acute on chronic renal failure, Escherichia coli UTI. Now presented back to hospital with acute on chronic kidney injury. Creatinine elevated to 3.17 on admission.    Subjective   Patient seen and examined, complains of left foot pain. She does have history of gout. Uric acid elevated to 8.8.   Assessment/Plan:     1. Acute kidney injury superimposed on chronic kidney disease stage IV- patient's baseline creatinine around 2.5, she presented with creatinine of 3.19. After IV fluids today creatinine is down to 2.51. Unclear etiology, likely poor by mouth intake/dehydration. Nephrology has been consulted for further recommendations. 2. Hyperkalemia-potassium was 5.7 on admission, patient got Kayexalate. Follow-up potassium this morning is 5.2. Will follow BMP in a.m. Will not repeat Kayexalate. 3. Hypertension- continue Amlodipine 10 mg by mouth daily. Blood pressure is stable. 4. UTI- patient was diagnosed with Escherichia coli UTI during previous admission and started on Cefuroxime 250 mg by mouth twice a day.  5. Abdominal pain- CT abdomen pelvis shows air-fluid level in the bladder ? Colovesical fistula. Patient also has had repeated UTIs over the past few months and has chronic abdominal pain. Will consult urology for further recommendations. 6. Gout-patient has left foot pain and swelling, uric acid is elevated to 8.8. We'll start colchicine 0.3 mg by mouth twice a day. 7. Diabetes mellitus- blood glucose is well controlled, continue sliding scale insulin with NovoLog. Continue Lantus 50 units subcutaneous daily.    DVT prophylaxis: Heparin  Code Status: DO NOT  RESUSCITATE  Family Communication: No family present at bedside   Disposition Plan: Home in next 24-48 hours   Consultants:    Procedures:    Continuous infusions . sodium chloride 75 mL/hr at 05/30/17 2225      Antibiotics:   Anti-infectives    Start     Dose/Rate Route Frequency Ordered Stop   05/30/17 2200  cefUROXime (CEFTIN) tablet 250 mg     250 mg Oral 2 times daily with meals 05/30/17 2108         Objective   Vitals:   05/30/17 2008 05/30/17 2015 05/30/17 2104 05/31/17 0408  BP: (!) 156/70  (!) 182/74 (!) 139/59  Pulse: 84 84  87  Resp: 18 15 20 20   Temp:   98.1 F (36.7 C)   TempSrc:   Oral   SpO2: 93% 95% 96% 96%  Weight:   86.1 kg (189 lb 12.8 oz)   Height:   5\' 1"  (1.549 m)     Intake/Output Summary (Last 24 hours) at 05/31/17 1156 Last data filed at 05/31/17 1056  Gross per 24 hour  Intake            752.5 ml  Output              350 ml  Net            402.5 ml   Filed Weights   05/30/17 2104  Weight: 86.1 kg (189 lb 12.8 oz)     Physical Examination:   Physical Exam: Eyes: No icterus, extraocular muscles intact  Mouth: Oral mucosa is moist, no lesions on palate,  Neck: Supple, no deformities, masses, or tenderness Lungs: Normal respiratory  effort, bilateral clear to auscultation, no crackles or wheezes.  Heart: Regular rate and rhythm, S1 and S2 normal, no murmurs, rubs auscultated Abdomen: BS normoactive,soft,nondistended,non-tender to palpation,no organomegaly Extremities: Left foot edema, warm to touch. Left ankle tender to palpation. Neuro : Alert and oriented to time, place and person, No focal deficits       Data Reviewed: I have personally reviewed following labs and imaging studies  CBG:  Recent Labs Lab 05/27/17 0739 05/27/17 1118 05/30/17 2103 05/31/17 0732 05/31/17 1141  GLUCAP 81 137* 121* 95 117*    CBC:  Recent Labs Lab 05/24/17 1518 05/25/17 0622 05/30/17 1549 05/31/17 0555  WBC 6.8 7.0 8.3  7.1  NEUTROABS 3.9  --  5.3  --   HGB 9.8* 10.2* 9.6* 8.9*  HCT 30.3* 32.2* 30.7* 28.0*  MCV 95.6 95.8 96.8 96.2  PLT 258 262 282 563    Basic Metabolic Panel:  Recent Labs Lab 05/24/17 1523 05/25/17 0622 05/26/17 0521 05/27/17 0607 05/30/17 1549 05/30/17 1553 05/31/17 0555  NA  --  143 138 139 137  --  135  K  --  5.5* 4.8 4.5 5.7*  --  5.2*  CL  --  114* 111 113* 108  --  111  CO2  --  22 20* 19* 20*  --  18*  GLUCOSE  --  140* 87 80 184*  --  115*  BUN  --  49* 44* 42* 56*  --  48*  CREATININE  --  2.85* 2.40* 2.49* 3.17*  --  2.51*  CALCIUM  --  8.8* 8.1* 8.3* 8.6*  --  7.9*  PHOS 2.6  --   --   --   --  4.0  --     Recent Results (from the past 240 hour(s))  Culture, Urine     Status: Abnormal   Collection Time: 05/25/17  9:43 AM  Result Value Ref Range Status   Specimen Description URINE, CATHETERIZED  Final   Special Requests NONE  Final   Culture >=100,000 COLONIES/mL ESCHERICHIA COLI (A)  Final   Report Status 05/27/2017 FINAL  Final   Organism ID, Bacteria ESCHERICHIA COLI (A)  Final      Susceptibility   Escherichia coli - MIC*    AMPICILLIN <=2 SENSITIVE Sensitive     CEFAZOLIN <=4 SENSITIVE Sensitive     CEFTRIAXONE <=1 SENSITIVE Sensitive     CIPROFLOXACIN <=0.25 SENSITIVE Sensitive     GENTAMICIN <=1 SENSITIVE Sensitive     IMIPENEM <=0.25 SENSITIVE Sensitive     NITROFURANTOIN 64 INTERMEDIATE Intermediate     TRIMETH/SULFA <=20 SENSITIVE Sensitive     AMPICILLIN/SULBACTAM <=2 SENSITIVE Sensitive     PIP/TAZO <=4 SENSITIVE Sensitive     Extended ESBL NEGATIVE Sensitive     * >=100,000 COLONIES/mL ESCHERICHIA COLI     Liver Function Tests:  Recent Labs Lab 05/31/17 0555  AST 12*  ALT 15  ALKPHOS 101  BILITOT 0.5  PROT 6.5  ALBUMIN 3.0*   No results for input(s): LIPASE, AMYLASE in the last 168 hours. No results for input(s): AMMONIA in the last 168 hours.  Cardiac Enzymes:  Recent Labs Lab 05/25/17 1232  CKTOTAL 92   BNP (last  3 results) No results for input(s): BNP in the last 8760 hours.  ProBNP (last 3 results) No results for input(s): PROBNP in the last 8760 hours.    Studies: Ct Abdomen Pelvis Wo Contrast  Result Date: 05/30/2017 CLINICAL DATA:  Persistent urinary tract infection  despite being on antibiotics x3 days. EXAM: CT ABDOMEN AND PELVIS WITHOUT CONTRAST TECHNIQUE: Multidetector CT imaging of the abdomen and pelvis was performed following the standard protocol without IV contrast. COMPARISON:  10/05/2016 FINDINGS: Lower chest: Stable mild cardiomegaly with trace pericardial effusion. Small hiatal hernia. There is atelectatic changes at each lung base and/or scarring. No effusion or pneumothorax. Hepatobiliary: No space-occupying mass of the liver. Granuloma in the right hepatic lobe. Contracted gallbladder without stones. No biliary dilatation. Pancreas: Atrophic pancreas without ductal dilatation or mass. No inflammation. Spleen: Normal in size with punctate calcification seen. Adrenals/Urinary Tract: Normal bilateral adrenal glands. An 18 mm cyst in the upper pole the left kidney appears stable. No obstructive uropathy. A small amount of air is seen within the bladder which could be due to instrumentation. Given reported history of left lower quadrant pain and presence of sigmoid diverticulosis adjacent to the bladder, the possibility of an enterovesical fistula accounting for the air within the bladder cannot be entirely excluded though believed less likely given lack of acute inflammation currently identified. Correlation with urinalysis may prove helpful. Stomach/Bowel: Contrast distention of the stomach. Normal small bowel rotation without obstruction. Moderate colonic stool burden with scattered colonic diverticulosis most markedly affecting the sigmoid colon. No acute bowel inflammation is seen however. Normal-appearing appendix. Vascular/Lymphatic: Aortoiliac atherosclerosis without aneurysm. Small  subcentimeter right lower quadrant and mesenteric lymph nodes are noted without pathologic enlargement. Reproductive: Hysterectomy.  No adnexal mass. Other: No abdominal wall hernia or abnormality. No abdominopelvic ascites. Musculoskeletal: No acute or significant osseous findings. Multilevel degenerative disc disease consistent thoracolumbar spondylosis IMPRESSION: 1. Colonic diverticulosis predominantly within the sigmoid colon without evidence of acute diverticulitis. 2. Stable appearance of the kidneys and ureters. There is an air-fluid level seen within the bladder which could be due to instrumentation. If the patient's urinalysis happens to grow enteric bacteria, the remote possibility of a colovesical fistula accounting for the air should be entertained although currently believed less likely. 3. Tiny granulomas in the liver and spleen. 4. Stable left upper pole cyst measuring 18 mm. Electronically Signed   By: Ashley Royalty M.D.   On: 05/30/2017 21:03    Scheduled Meds: . amLODipine  10 mg Oral Daily  . cefUROXime  250 mg Oral BID WC  . colchicine  0.3 mg Oral BID  . heparin  5,000 Units Subcutaneous Q8H  . insulin aspart  0-15 Units Subcutaneous TID WC  . insulin glargine  50 Units Subcutaneous QHS      Time spent: 25 min  [facility] Pager [phone]. If 7PM-7AM, please contact night-coverage at [email or web address], Office  [phone]  password [email or web address] 05/31/2017, 11:56 AM  LOS: 0 days

## 2017-06-01 DIAGNOSIS — N179 Acute kidney failure, unspecified: Secondary | ICD-10-CM

## 2017-06-01 LAB — CBC
HEMATOCRIT: 30.2 % — AB (ref 36.0–46.0)
Hemoglobin: 9.7 g/dL — ABNORMAL LOW (ref 12.0–15.0)
MCH: 30.7 pg (ref 26.0–34.0)
MCHC: 32.1 g/dL (ref 30.0–36.0)
MCV: 95.6 fL (ref 78.0–100.0)
PLATELETS: 294 10*3/uL (ref 150–400)
RBC: 3.16 MIL/uL — AB (ref 3.87–5.11)
RDW: 13.9 % (ref 11.5–15.5)
WBC: 6.3 10*3/uL (ref 4.0–10.5)

## 2017-06-01 LAB — BASIC METABOLIC PANEL
ANION GAP: 7 (ref 5–15)
BUN: 40 mg/dL — ABNORMAL HIGH (ref 6–20)
CO2: 19 mmol/L — ABNORMAL LOW (ref 22–32)
Calcium: 8.6 mg/dL — ABNORMAL LOW (ref 8.9–10.3)
Chloride: 112 mmol/L — ABNORMAL HIGH (ref 101–111)
Creatinine, Ser: 2.26 mg/dL — ABNORMAL HIGH (ref 0.44–1.00)
GFR, EST AFRICAN AMERICAN: 22 mL/min — AB (ref >60)
GFR, EST NON AFRICAN AMERICAN: 19 mL/min — AB (ref >60)
GLUCOSE: 88 mg/dL (ref 65–99)
POTASSIUM: 5.7 mmol/L — AB (ref 3.5–5.1)
Sodium: 138 mmol/L (ref 135–145)

## 2017-06-01 LAB — GLUCOSE, CAPILLARY
GLUCOSE-CAPILLARY: 78 mg/dL (ref 65–99)
Glucose-Capillary: 80 mg/dL (ref 65–99)
Glucose-Capillary: 126 mg/dL — ABNORMAL HIGH (ref 65–99)

## 2017-06-01 MED ORDER — PATIROMER SORBITEX CALCIUM 8.4 G PO PACK
8.4000 g | PACK | Freq: Every day | ORAL | Status: DC
  Administered 2017-06-01: 8.4 g via ORAL
  Filled 2017-06-01 (×5): qty 4

## 2017-06-01 MED ORDER — SODIUM POLYSTYRENE SULFONATE 15 GM/60ML PO SUSP: 15.0000 g | Freq: Once | ORAL | Status: DC

## 2017-06-01 MED ORDER — FUROSEMIDE 10 MG/ML IJ SOLN
40.0000 mg | Freq: Two times a day (BID) | INTRAMUSCULAR | Status: DC
  Administered 2017-06-01 – 2017-06-02 (×3): 40 mg via INTRAVENOUS
  Filled 2017-06-01 (×3): qty 4

## 2017-06-01 MED ORDER — CEFUROXIME AXETIL 250 MG PO TABS: 250.0000 mg | ORAL_TABLET | Freq: Every day | ORAL | Status: DC

## 2017-06-01 NOTE — Progress Notes (Addendum)
Subjective: Interval History: has no complaint of difficulty breathing. Patient stated that she is feeling much better. Presently she offers no complaints..  Objective: Vital signs in last 24 hours: Temp:  [98.4 F (36.9 C)-98.6 F (37 C)] 98.6 F (37 C) (06/14 0815) Pulse Rate:  [79-84] 84 (06/14 0815) Resp:  [18-21] 21 (06/14 0815) BP: (116-170)/(69-88) 149/81 (06/14 0815) SpO2:  [98 %-100 %] 100 % (06/14 0815) Weight change:   Intake/Output from previous day: 06/13 0701 - 06/14 0700 In: 1742.5 [P.O.:840; I.V.:902.5] Out: 1750 [Urine:1750] Intake/Output this shift: Total I/O In: -  Out: 200 [Urine:200]  General appearance: alert, cooperative and no distress Resp: clear to auscultation bilaterally Cardio: regular rate and rhythm Extremities: edema No edema  Lab Results:  Recent Labs  05/31/17 0555 06/01/17 0555  WBC 7.1 6.3  HGB 8.9* 9.7*  HCT 28.0* 30.2*  PLT 246 294   BMET:  Recent Labs  05/31/17 0555 06/01/17 0555  NA 135 138  K 5.2* 5.7*  CL 111 112*  CO2 18* 19*  GLUCOSE 115* 88  BUN 48* 40*  CREATININE 2.51* 2.26*  CALCIUM 7.9* 8.6*   No results for input(s): PTH in the last 72 hours. Iron Studies: No results for input(s): IRON, TIBC, TRANSFERRIN, FERRITIN in the last 72 hours.  Studies/Results: Ct Abdomen Pelvis Wo Contrast  Result Date: 05/30/2017 CLINICAL DATA:  Persistent urinary tract infection despite being on antibiotics x3 days. EXAM: CT ABDOMEN AND PELVIS WITHOUT CONTRAST TECHNIQUE: Multidetector CT imaging of the abdomen and pelvis was performed following the standard protocol without IV contrast. COMPARISON:  10/05/2016 FINDINGS: Lower chest: Stable mild cardiomegaly with trace pericardial effusion. Small hiatal hernia. There is atelectatic changes at each lung base and/or scarring. No effusion or pneumothorax. Hepatobiliary: No space-occupying mass of the liver. Granuloma in the right hepatic lobe. Contracted gallbladder without stones.  No biliary dilatation. Pancreas: Atrophic pancreas without ductal dilatation or mass. No inflammation. Spleen: Normal in size with punctate calcification seen. Adrenals/Urinary Tract: Normal bilateral adrenal glands. An 18 mm cyst in the upper pole the left kidney appears stable. No obstructive uropathy. A small amount of air is seen within the bladder which could be due to instrumentation. Given reported history of left lower quadrant pain and presence of sigmoid diverticulosis adjacent to the bladder, the possibility of an enterovesical fistula accounting for the air within the bladder cannot be entirely excluded though believed less likely given lack of acute inflammation currently identified. Correlation with urinalysis may prove helpful. Stomach/Bowel: Contrast distention of the stomach. Normal small bowel rotation without obstruction. Moderate colonic stool burden with scattered colonic diverticulosis most markedly affecting the sigmoid colon. No acute bowel inflammation is seen however. Normal-appearing appendix. Vascular/Lymphatic: Aortoiliac atherosclerosis without aneurysm. Small subcentimeter right lower quadrant and mesenteric lymph nodes are noted without pathologic enlargement. Reproductive: Hysterectomy.  No adnexal mass. Other: No abdominal wall hernia or abnormality. No abdominopelvic ascites. Musculoskeletal: No acute or significant osseous findings. Multilevel degenerative disc disease consistent thoracolumbar spondylosis IMPRESSION: 1. Colonic diverticulosis predominantly within the sigmoid colon without evidence of acute diverticulitis. 2. Stable appearance of the kidneys and ureters. There is an air-fluid level seen within the bladder which could be due to instrumentation. If the patient's urinalysis happens to grow enteric bacteria, the remote possibility of a colovesical fistula accounting for the air should be entertained although currently believed less likely. 3. Tiny granulomas in the  liver and spleen. 4. Stable left upper pole cyst measuring 18 mm. Electronically Signed  By: Ashley Royalty M.D.   On: 05/30/2017 21:03    I have reviewed the patient's current medications.  Assessment/Plan: Problem #1 acute kidney injury superimposed on chronic. Presently her renal function seems to be improving. He shouldn't at 1700 mL of urine output. Etiology was to be secondary to prerenal/ATN. Problem #2 chronic renal failure: Stage IV. Most likely secondary to diabetes/hypertension/age-related renal function loss. 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 Her potassium this morning is high 5.7. Problem #5 history of gout Problem #6 anemia: Most likely secondary to chronic renal failure. Previous workup for iron deficiency anemia was negative. Problem #7 diabetes Plan: 1]Patient advised to cut down her potassium intake 2] increase IV fluids to 100 mL per hour 3] we'll start her on Lasix 40 mg IV twice a day 4] we'll start her on Veltessa 8.4 g by mouth once a day    LOS: 1 day   Moani Weipert S 06/01/2017,9:07 AM

## 2017-06-01 NOTE — Progress Notes (Signed)
PROGRESS NOTE    Kristen Kaufman  XQJ:[id number] DOB: 03-Jan-1931 DOA: 05/30/2017 PCP: Sherrilee Gilles, DO     Brief Narrative:  83 y/o woman admitted to the hospital on 6/12 due to weakness after having recently been treated for an E Coli UTI. Here with acute on CKD.   Assessment & Plan:   Principal Problem:   AKI (acute kidney injury) ([place]) Active Problems:   Diabetes mellitus type 2 in obese [facility])   Essential hypertension   Acute renal failure superimposed on stage 4 chronic kidney disease (HCC)   Anemia in stage 4 chronic kidney disease (HCC)   UTI (urinary tract infection), uncomplicated   Hyperkalemia   Acute on CKD Stage IV -Baseline Cr around 2.5. -Presumed due to ATN/prerenal causes. -Renal on board: has increased IVF and started lasix 40 BID.  Air-Fluid level in Bladder/Pneumaturia -Seen on CT scan raising concern for a colovesicular fistula. -Seen by GU: they do not believe this is the case, but are recommending OP cystoscopy once discharged from the hospital.  Acute Gout -Continue colchicine, improved.  DM -Well controlled.  Hyperkalemia -Has been started on lasix. -Give kayexalate today. -Recheck in am.   DVT prophylaxis: SQ heparin Code Status: DNR Family Communication: husband at bedside Disposition Plan: home when ready  Consultants:   Nephrology  Procedures:   None  Antimicrobials:  Anti-infectives    Start     Dose/Rate Route Frequency Ordered Stop   06/02/17 0800  cefUROXime (CEFTIN) tablet 250 mg     250 mg Oral Daily with breakfast 06/01/17 1504     05/30/17 2200  cefUROXime (CEFTIN) tablet 250 mg  Status:  Discontinued     250 mg Oral 2 times daily with meals 05/30/17 2108 06/01/17 1504       Subjective: Has no complaints.   Objective: Vitals:   05/31/17 2053 06/01/17 0440 06/01/17 0815 06/01/17 1500  BP: (!) 170/88 (!) 165/69 (!) 149/81 (!) 173/73  Pulse: 83 79 84 74  Resp: 20 20 (!) 21 20  Temp: 98.4 F (36.9 C)  98.6  F (37 C) 98.1 F (36.7 C)  TempSrc: Oral  Oral Oral  SpO2: 98% 100% 100% 100%  Weight:      Height:        Intake/Output Summary (Last 24 hours) at 06/01/17 1818 Last data filed at 06/01/17 1700  Gross per 24 hour  Intake             1440 ml  Output             2940 ml  Net            -1500 ml   Filed Weights   05/30/17 2104  Weight: 86.1 kg (189 lb 12.8 oz)    Examination:  General exam: Alert, awake, oriented x 3 Respiratory system: Clear to auscultation. Respiratory effort normal. Cardiovascular system:RRR. No murmurs, rubs, gallops. Gastrointestinal system: Abdomen is nondistended, soft and nontender. No organomegaly or masses felt. Normal bowel sounds heard. Central nervous system: Alert and oriented. No focal neurological deficits. Extremities: No C/C/E, +pedal pulses Skin: No rashes, lesions or ulcers Psychiatry: Judgement and insight appear normal. Mood & affect appropriate.     Data Reviewed: I have personally reviewed following labs and imaging studies  CBC:  Recent Labs Lab 05/30/17 1549 05/31/17 0555 06/01/17 0555  WBC 8.3 7.1 6.3  NEUTROABS 5.3  --   --   HGB 9.6* 8.9* 9.7*  HCT 30.7* 28.0* 30.2*  MCV  96.8 96.2 95.6  PLT 282 246 681   Basic Metabolic Panel:  Recent Labs Lab 05/26/17 0521 05/27/17 0607 05/30/17 1549 05/30/17 1553 05/31/17 0555 06/01/17 0555  NA 138 139 137  --  135 138  K 4.8 4.5 5.7*  --  5.2* 5.7*  CL 111 113* 108  --  111 112*  CO2 20* 19* 20*  --  18* 19*  GLUCOSE 87 80 184*  --  115* 88  BUN 44* 42* 56*  --  48* 40*  CREATININE 2.40* 2.49* 3.17*  --  2.51* 2.26*  CALCIUM 8.1* 8.3* 8.6*  --  7.9* 8.6*  PHOS  --   --   --  4.0  --   --    GFR: Estimated Creatinine Clearance: 18.5 mL/min (A) (by C-G formula based on SCr of 2.26 mg/dL (H)). Liver Function Tests:  Recent Labs Lab 05/31/17 0555  AST 12*  ALT 15  ALKPHOS 101  BILITOT 0.5  PROT 6.5  ALBUMIN 3.0*   No results for input(s): LIPASE, AMYLASE in  the last 168 hours. No results for input(s): AMMONIA in the last 168 hours. Coagulation Profile: No results for input(s): INR, PROTIME in the last 168 hours. Cardiac Enzymes: No results for input(s): CKTOTAL, CKMB, CKMBINDEX, TROPONINI in the last 168 hours. BNP (last 3 results) No results for input(s): PROBNP in the last 8760 hours. HbA1C: No results for input(s): HGBA1C in the last 72 hours. CBG:  Recent Labs Lab 05/31/17 1658 05/31/17 2043 06/01/17 0722 06/01/17 1101 06/01/17 1636  GLUCAP 89 109* 80 126* 78   Lipid Profile: No results for input(s): CHOL, HDL, LDLCALC, TRIG, CHOLHDL, LDLDIRECT in the last 72 hours. Thyroid Function Tests: No results for input(s): TSH, T4TOTAL, FREET4, T3FREE, THYROIDAB in the last 72 hours. Anemia Panel: No results for input(s): VITAMINB12, FOLATE, FERRITIN, TIBC, IRON, RETICCTPCT in the last 72 hours. Urine analysis:    Component Value Date/Time   COLORURINE YELLOW 05/30/2017 1530   APPEARANCEUR CLEAR 05/30/2017 1530   LABSPEC 1.014 05/30/2017 1530   PHURINE 5.0 05/30/2017 1530   GLUCOSEU NEGATIVE 05/30/2017 1530   HGBUR NEGATIVE 05/30/2017 1530   BILIRUBINUR NEGATIVE 05/30/2017 1530   KETONESUR NEGATIVE 05/30/2017 1530   PROTEINUR 100 (A) 05/30/2017 1530   NITRITE NEGATIVE 05/30/2017 1530   LEUKOCYTESUR NEGATIVE 05/30/2017 1530   Sepsis Labs: @LABRCNTIP (procalcitonin:4,lacticidven:4)  ) Recent Results (from the past 240 hour(s))  Culture, Urine     Status: Abnormal   Collection Time: 05/25/17  9:43 AM  Result Value Ref Range Status   Specimen Description URINE, CATHETERIZED  Final   Special Requests NONE  Final   Culture >=100,000 COLONIES/mL ESCHERICHIA COLI (A)  Final   Report Status 05/27/2017 FINAL  Final   Organism ID, Bacteria ESCHERICHIA COLI (A)  Final      Susceptibility   Escherichia coli - MIC*    AMPICILLIN <=2 SENSITIVE Sensitive     CEFAZOLIN <=4 SENSITIVE Sensitive     CEFTRIAXONE <=1 SENSITIVE Sensitive       CIPROFLOXACIN <=0.25 SENSITIVE Sensitive     GENTAMICIN <=1 SENSITIVE Sensitive     IMIPENEM <=0.25 SENSITIVE Sensitive     NITROFURANTOIN 64 INTERMEDIATE Intermediate     TRIMETH/SULFA <=20 SENSITIVE Sensitive     AMPICILLIN/SULBACTAM <=2 SENSITIVE Sensitive     PIP/TAZO <=4 SENSITIVE Sensitive     Extended ESBL NEGATIVE Sensitive     * >=100,000 COLONIES/mL ESCHERICHIA COLI         Radiology Studies:  Ct Abdomen Pelvis Wo Contrast  Result Date: 05/30/2017 CLINICAL DATA:  Persistent urinary tract infection despite being on antibiotics x3 days. EXAM: CT ABDOMEN AND PELVIS WITHOUT CONTRAST TECHNIQUE: Multidetector CT imaging of the abdomen and pelvis was performed following the standard protocol without IV contrast. COMPARISON:  10/05/2016 FINDINGS: Lower chest: Stable mild cardiomegaly with trace pericardial effusion. Small hiatal hernia. There is atelectatic changes at each lung base and/or scarring. No effusion or pneumothorax. Hepatobiliary: No space-occupying mass of the liver. Granuloma in the right hepatic lobe. Contracted gallbladder without stones. No biliary dilatation. Pancreas: Atrophic pancreas without ductal dilatation or mass. No inflammation. Spleen: Normal in size with punctate calcification seen. Adrenals/Urinary Tract: Normal bilateral adrenal glands. An 18 mm cyst in the upper pole the left kidney appears stable. No obstructive uropathy. A small amount of air is seen within the bladder which could be due to instrumentation. Given reported history of left lower quadrant pain and presence of sigmoid diverticulosis adjacent to the bladder, the possibility of an enterovesical fistula accounting for the air within the bladder cannot be entirely excluded though believed less likely given lack of acute inflammation currently identified. Correlation with urinalysis may prove helpful. Stomach/Bowel: Contrast distention of the stomach. Normal small bowel rotation without obstruction.  Moderate colonic stool burden with scattered colonic diverticulosis most markedly affecting the sigmoid colon. No acute bowel inflammation is seen however. Normal-appearing appendix. Vascular/Lymphatic: Aortoiliac atherosclerosis without aneurysm. Small subcentimeter right lower quadrant and mesenteric lymph nodes are noted without pathologic enlargement. Reproductive: Hysterectomy.  No adnexal mass. Other: No abdominal wall hernia or abnormality. No abdominopelvic ascites. Musculoskeletal: No acute or significant osseous findings. Multilevel degenerative disc disease consistent thoracolumbar spondylosis IMPRESSION: 1. Colonic diverticulosis predominantly within the sigmoid colon without evidence of acute diverticulitis. 2. Stable appearance of the kidneys and ureters. There is an air-fluid level seen within the bladder which could be due to instrumentation. If the patient's urinalysis happens to grow enteric bacteria, the remote possibility of a colovesical fistula accounting for the air should be entertained although currently believed less likely. 3. Tiny granulomas in the liver and spleen. 4. Stable left upper pole cyst measuring 18 mm. Electronically Signed   By: Ashley Royalty M.D.   On: 05/30/2017 21:03        Scheduled Meds: . amLODipine  10 mg Oral Daily  . [START ON 06/02/2017] cefUROXime  250 mg Oral Q breakfast  . colchicine  0.3 mg Oral BID  . furosemide  40 mg Intravenous BID  . heparin  5,000 Units Subcutaneous Q8H  . insulin aspart  0-15 Units Subcutaneous TID WC  . insulin glargine  50 Units Subcutaneous QHS  . patiromer  8.4 g Oral Daily   Continuous Infusions: . sodium chloride 100 mL/hr at 06/01/17 0913     LOS: 1 day    Time spent: 25 minutes. Greater than 50% of this time was spent in direct contact with the patient coordinating care.     Lelon Frohlich, MD [facility] Pager [phone]  If 7PM-7AM, please contact  night-coverage [email or web address] Password [email or web address] 06/01/2017, 6:18 PM

## 2017-06-02 LAB — RENAL FUNCTION PANEL
ALBUMIN: 3.4 g/dL — AB (ref 3.5–5.0)
Anion gap: 9 (ref 5–15)
BUN: 46 mg/dL — AB (ref 6–20)
CO2: 21 mmol/L — ABNORMAL LOW (ref 22–32)
CREATININE: 2.41 mg/dL — AB (ref 0.44–1.00)
Calcium: 8.8 mg/dL — ABNORMAL LOW (ref 8.9–10.3)
Chloride: 110 mmol/L (ref 101–111)
GFR calc Af Amer: 20 mL/min — ABNORMAL LOW (ref >60)
GFR, EST NON AFRICAN AMERICAN: 17 mL/min — AB (ref >60)
Glucose, Bld: 67 mg/dL (ref 65–99)
POTASSIUM: 4.5 mmol/L (ref 3.5–5.1)
Phosphorus: 3.9 mg/dL (ref 2.5–4.6)
Sodium: 140 mmol/L (ref 135–145)

## 2017-06-02 LAB — GLUCOSE, CAPILLARY
GLUCOSE-CAPILLARY: 124 mg/dL — AB (ref 65–99)
GLUCOSE-CAPILLARY: 64 mg/dL — AB (ref 65–99)
GLUCOSE-CAPILLARY: 95 mg/dL (ref 65–99)
GLUCOSE-CAPILLARY: 116 mg/dL — AB (ref 65–99)
Glucose-Capillary: 103 mg/dL — ABNORMAL HIGH (ref 65–99)
Glucose-Capillary: 62 mg/dL — ABNORMAL LOW (ref 65–99)
Glucose-Capillary: 186 mg/dL — ABNORMAL HIGH (ref 65–99)

## 2017-06-02 MED ORDER — TORSEMIDE 20 MG PO TABS
20.0000 mg | ORAL_TABLET | Freq: Every day | ORAL | Status: DC
  Administered 2017-06-02 – 2017-06-03 (×2): 20 mg via ORAL
  Filled 2017-06-02 (×2): qty 1

## 2017-06-02 NOTE — Progress Notes (Addendum)
Hypoglycemic Event  CBG: 62  Treatment: Breakfast tray   Symptoms: None   Follow-up CBG: Time:0826 CBG Result:103  Possible Reasons for Event:  Comments/MD notified: Hernandez     Mattia Osterman

## 2017-06-02 NOTE — Progress Notes (Signed)
Subjective: Interval History: Patient offers no complaints. She is feeling much better..  Objective: Vital signs in last 24 hours: Temp:  [97.8 F (36.6 C)-98.1 F (36.7 C)] 97.8 F (36.6 C) (06/15 0552) Pulse Rate:  [71-86] 71 (06/15 0552) Resp:  [18-20] 18 (06/15 0552) BP: (157-174)/(73-85) 174/78 (06/15 0552) SpO2:  [99 %-100 %] 99 % (06/15 0552) Weight change:   Intake/Output from previous day: 06/14 0701 - 06/15 0700 In: 4256.7 [P.O.:1200; I.V.:3056.7] Out: 2340 [Urine:2340] Intake/Output this shift: No intake/output data recorded.  General appearance: alert, cooperative and no distress Resp: clear to auscultation bilaterally Cardio: regular rate and rhythm Extremities: edema No edema  Lab Results:  Recent Labs  05/31/17 0555 06/01/17 0555  WBC 7.1 6.3  HGB 8.9* 9.7*  HCT 28.0* 30.2*  PLT 246 294   BMET:   Recent Labs  06/01/17 0555 06/02/17 0605  NA 138 140  K 5.7* 4.5  CL 112* 110  CO2 19* 21*  GLUCOSE 88 67  BUN 40* 46*  CREATININE 2.26* 2.41*  CALCIUM 8.6* 8.8*   No results for input(s): PTH in the last 72 hours. Iron Studies: No results for input(s): IRON, TIBC, TRANSFERRIN, FERRITIN in the last 72 hours.  Studies/Results: No results found.  I have reviewed the patient's current medications.  Assessment/Plan: Problem #1 acute kidney injury superimposed on chronic.  Etiology was to be secondary to prerenal/ATN. Presently her creatinine slightly higher than yesterday but overall seems to be stable. As soon as this is possibly her baseline. Patient presently nonoliguric with 2300 mL of urine output. Problem #2 chronic renal failure: Stage IV. Most likely secondary to diabetes/hypertension/age-related renal function loss. Problem #3 hypertension: Her blood pressure is reasonably controlled Problem #4 hyperkalemia: Possibly combination of high potassium intake/worsening of renal failure. Her potassium has corrected. Problem #5 history of gout:  Uric acid is 8.8 high. Problem #6 anemia: Most likely secondary to chronic renal failure. Previous workup for iron deficiency anemia was negative. Problem #7 diabetes Plan: 1] will DC IV fluid 2]We will D/C Veltessa 3] we'll DC IV Lasix and put her on Demadex 20 mg once a day  4] renal panel in the morning.     LOS: 2 days   Jahzara Slattery S 06/02/2017,8:32 AM

## 2017-06-02 NOTE — Progress Notes (Addendum)
Hypoglycemic Event  CBG: 64  Treatment: orange Juice and crackers   Symptoms: sleeping   Follow-up CBG: Time:0800 CBG Result:62    Possible Reasons for Event:   Comments/MD notified: Hernandez    Kristen Kaufman

## 2017-06-02 NOTE — Progress Notes (Signed)
PROGRESS NOTE    Kristen Kaufman  NID:[id number] DOB: [birth year] DOA: 05/30/2017 PCP: Sherrilee Gilles, DO     Brief Narrative:  83 y/o woman admitted to the hospital on 6/12 due to weakness after having recently been treated for an E Coli UTI. Here with acute on CKD.   Assessment & Plan:   Principal Problem:   AKI (acute kidney injury) ([place]) Active Problems:   Diabetes mellitus type 2 in obese [facility])   Essential hypertension   Acute renal failure superimposed on stage 4 chronic kidney disease (HCC)   Anemia in stage 4 chronic kidney disease (HCC)   UTI (urinary tract infection), uncomplicated   Hyperkalemia   Acute on CKD Stage IV -Baseline Cr around 2.5. -Cr is at 2.4 today which is up from 2.2 yesterday. -Presumed due to ATN/prerenal causes. -Renal on board: changing over to demadex.  Air-Fluid level in Bladder/Pneumaturia -Seen on CT scan raising concern for a colovesicular fistula. -Seen by GU: they do not believe this is the case, but are recommending OP cystoscopy once discharged from the hospital.  Acute Gout -Continue colchicine, improved.  DM -Well controlled.  Hyperkalemia -Resolved after keyexalate and lasix given yesterday.   DVT prophylaxis: SQ heparin Code Status: DNR Family Communication: husband at bedside Disposition Plan: anticipate DC home in 24 hours  Consultants:   Nephrology  Procedures:   None  Antimicrobials:  Anti-infectives    Start     Dose/Rate Route Frequency Ordered Stop   06/02/17 0800  cefUROXime (CEFTIN) tablet 250 mg  Status:  Discontinued     250 mg Oral Daily with breakfast 06/01/17 1504 06/01/17 1824   05/30/17 2200  cefUROXime (CEFTIN) tablet 250 mg  Status:  Discontinued     250 mg Oral 2 times daily with meals 05/30/17 2108 06/01/17 1504       Subjective: Sitting in chair. No complaints.  Objective: Vitals:   06/01/17 1500 06/01/17 2100 06/02/17 0552 06/02/17 1430  BP: (!) 173/73 (!) 157/85 (!) 174/78 101/74    Pulse: 74 86 71 82  Resp: 20 20 18 18   Temp: 98.1 F (36.7 C) 98.1 F (36.7 C) 97.8 F (36.6 C) 97.9 F (36.6 C)  TempSrc: Oral Oral Oral Oral  SpO2: 100% 100% 99% 99%  Weight:      Height:        Intake/Output Summary (Last 24 hours) at 06/02/17 1640 Last data filed at 06/02/17 0905  Gross per 24 hour  Intake          3656.67 ml  Output             1280 ml  Net          2376.67 ml   Filed Weights   05/30/17 2104  Weight: 86.1 kg (189 lb 12.8 oz)    Examination:  General exam: Alert, awake, oriented x 3 Respiratory system: Clear to auscultation. Respiratory effort normal. Cardiovascular system:RRR. No murmurs, rubs, gallops. Gastrointestinal system: Abdomen is nondistended, soft and nontender. No organomegaly or masses felt. Normal bowel sounds heard. Central nervous system: Alert and oriented. No focal neurological deficits. Extremities: No C/C/E, +pedal pulses Skin: No rashes, lesions or ulcers Psychiatry: Judgement and insight appear normal. Mood & affect appropriate.       Data Reviewed: I have personally reviewed following labs and imaging studies  CBC:  Recent Labs Lab 05/30/17 1549 05/31/17 0555 06/01/17 0555  WBC 8.3 7.1 6.3  NEUTROABS 5.3  --   --   HGB  9.6* 8.9* 9.7*  HCT 30.7* 28.0* 30.2*  MCV 96.8 96.2 95.6  PLT 282 246 979   Basic Metabolic Panel:  Recent Labs Lab 05/27/17 0607 05/30/17 1549 05/30/17 1553 05/31/17 0555 06/01/17 0555 06/02/17 0605  NA 139 137  --  135 138 140  K 4.5 5.7*  --  5.2* 5.7* 4.5  CL 113* 108  --  111 112* 110  CO2 19* 20*  --  18* 19* 21*  GLUCOSE 80 184*  --  115* 88 67  BUN 42* 56*  --  48* 40* 46*  CREATININE 2.49* 3.17*  --  2.51* 2.26* 2.41*  CALCIUM 8.3* 8.6*  --  7.9* 8.6* 8.8*  PHOS  --   --  4.0  --   --  3.9   GFR: Estimated Creatinine Clearance: 17.3 mL/min (A) (by C-G formula based on SCr of 2.41 mg/dL (H)). Liver Function Tests:  Recent Labs Lab 05/31/17 0555 06/02/17 0605  AST  12*  --   ALT 15  --   ALKPHOS 101  --   BILITOT 0.5  --   PROT 6.5  --   ALBUMIN 3.0* 3.4*   No results for input(s): LIPASE, AMYLASE in the last 168 hours. No results for input(s): AMMONIA in the last 168 hours. Coagulation Profile: No results for input(s): INR, PROTIME in the last 168 hours. Cardiac Enzymes: No results for input(s): CKTOTAL, CKMB, CKMBINDEX, TROPONINI in the last 168 hours. BNP (last 3 results) No results for input(s): PROBNP in the last 8760 hours. HbA1C: No results for input(s): HGBA1C in the last 72 hours. CBG:  Recent Labs Lab 06/02/17 0729 06/02/17 0800 06/02/17 0826 06/02/17 1122 06/02/17 1625  GLUCAP 64* 62* 103* 186* 95   Lipid Profile: No results for input(s): CHOL, HDL, LDLCALC, TRIG, CHOLHDL, LDLDIRECT in the last 72 hours. Thyroid Function Tests: No results for input(s): TSH, T4TOTAL, FREET4, T3FREE, THYROIDAB in the last 72 hours. Anemia Panel: No results for input(s): VITAMINB12, FOLATE, FERRITIN, TIBC, IRON, RETICCTPCT in the last 72 hours. Urine analysis:    Component Value Date/Time   COLORURINE YELLOW 05/30/2017 1530   APPEARANCEUR CLEAR 05/30/2017 1530   LABSPEC 1.014 05/30/2017 1530   PHURINE 5.0 05/30/2017 1530   GLUCOSEU NEGATIVE 05/30/2017 1530   HGBUR NEGATIVE 05/30/2017 1530   BILIRUBINUR NEGATIVE 05/30/2017 1530   KETONESUR NEGATIVE 05/30/2017 1530   PROTEINUR 100 (A) 05/30/2017 1530   NITRITE NEGATIVE 05/30/2017 1530   LEUKOCYTESUR NEGATIVE 05/30/2017 1530   Sepsis Labs: @LABRCNTIP (procalcitonin:4,lacticidven:4)  ) Recent Results (from the past 240 hour(s))  Culture, Urine     Status: Abnormal   Collection Time: 05/25/17  9:43 AM  Result Value Ref Range Status   Specimen Description URINE, CATHETERIZED  Final   Special Requests NONE  Final   Culture >=100,000 COLONIES/mL ESCHERICHIA COLI (A)  Final   Report Status 05/27/2017 FINAL  Final   Organism ID, Bacteria ESCHERICHIA COLI (A)  Final      Susceptibility     Escherichia coli - MIC*    AMPICILLIN <=2 SENSITIVE Sensitive     CEFAZOLIN <=4 SENSITIVE Sensitive     CEFTRIAXONE <=1 SENSITIVE Sensitive     CIPROFLOXACIN <=0.25 SENSITIVE Sensitive     GENTAMICIN <=1 SENSITIVE Sensitive     IMIPENEM <=0.25 SENSITIVE Sensitive     NITROFURANTOIN 64 INTERMEDIATE Intermediate     TRIMETH/SULFA <=20 SENSITIVE Sensitive     AMPICILLIN/SULBACTAM <=2 SENSITIVE Sensitive     PIP/TAZO <=4 SENSITIVE Sensitive  Extended ESBL NEGATIVE Sensitive     * >=100,000 COLONIES/mL ESCHERICHIA COLI         Radiology Studies: No results found.      Scheduled Meds: . amLODipine  10 mg Oral Daily  . colchicine  0.3 mg Oral BID  . heparin  5,000 Units Subcutaneous Q8H  . insulin aspart  0-15 Units Subcutaneous TID WC  . insulin glargine  50 Units Subcutaneous QHS  . sodium polystyrene  15 g Oral Once  . torsemide  20 mg Oral Daily   Continuous Infusions:    LOS: 2 days    Time spent: 25 minutes. Greater than 50% of this time was spent in direct contact with the patient coordinating care.     Lelon Frohlich, MD [facility] Pager [phone]  If 7PM-7AM, please contact night-coverage [email or web address] Password [email or web address] 06/02/2017, 4:40 PM

## 2017-06-02 NOTE — Care Management Important Message (Signed)
Important Message  Patient Details  Name: Kristen Kaufman MRN: [id number] Date of Birth: 12-05-1931   Medicare Important Message Given:  Yes    Glorianna Gott, Chauncey Reading, RN 06/02/2017, 10:41 AM

## 2017-06-03 LAB — GLUCOSE, CAPILLARY
GLUCOSE-CAPILLARY: 112 mg/dL — AB (ref 65–99)
Glucose-Capillary: 50 mg/dL — ABNORMAL LOW (ref 65–99)
Glucose-Capillary: 202 mg/dL — ABNORMAL HIGH (ref 65–99)

## 2017-06-03 LAB — RENAL FUNCTION PANEL
ALBUMIN: 3.4 g/dL — AB (ref 3.5–5.0)
Anion gap: 10 (ref 5–15)
BUN: 55 mg/dL — AB (ref 6–20)
CHLORIDE: 106 mmol/L (ref 101–111)
CO2: 22 mmol/L (ref 22–32)
CREATININE: 2.76 mg/dL — AB (ref 0.44–1.00)
Calcium: 8.5 mg/dL — ABNORMAL LOW (ref 8.9–10.3)
GFR, EST AFRICAN AMERICAN: 17 mL/min — AB (ref >60)
GFR, EST NON AFRICAN AMERICAN: 15 mL/min — AB (ref >60)
Glucose, Bld: 59 mg/dL — ABNORMAL LOW (ref 65–99)
PHOSPHORUS: 4 mg/dL (ref 2.5–4.6)
Potassium: 4.4 mmol/L (ref 3.5–5.1)
Sodium: 138 mmol/L (ref 135–145)

## 2017-06-03 MED ORDER — FEBUXOSTAT 40 MG PO TABS
40.0000 mg | ORAL_TABLET | Freq: Every day | ORAL | Status: DC
  Administered 2017-06-03: 40 mg via ORAL
  Filled 2017-06-03: qty 1

## 2017-06-03 MED ORDER — FEBUXOSTAT 40 MG PO TABS: 40.0000 mg | ORAL_TABLET | Freq: Every day | ORAL | 1 refills | Status: DC

## 2017-06-03 MED ORDER — TORSEMIDE 20 MG PO TABS: 20.0000 mg | ORAL_TABLET | Freq: Every day | ORAL | 2 refills | Status: DC

## 2017-06-03 NOTE — Progress Notes (Signed)
Subjective: Interval History: Patient is feeling good and she offers no complaints. She denies any difficulty breathing.  Objective: Vital signs in last 24 hours: Temp:  [97.1 F (36.2 C)-97.9 F (36.6 C)] 97.1 F (36.2 C) (06/16 0651) Pulse Rate:  [80-83] 80 (06/16 0651) Resp:  [18] 18 (06/16 0651) BP: (101-152)/(74-78) 147/78 (06/16 0651) SpO2:  [99 %] 99 % (06/16 0651) Weight change:   Intake/Output from previous day: 06/15 0701 - 06/16 0700 In: 600 [P.O.:600] Out: 400 [Urine:400] Intake/Output this shift: No intake/output data recorded.  General appearance: alert, cooperative and no distress Resp: clear to auscultation bilaterally Cardio: regular rate and rhythm Extremities: edema No edema  Lab Results:  Recent Labs  06/01/17 0555  WBC 6.3  HGB 9.7*  HCT 30.2*  PLT 294   BMET:   Recent Labs  06/02/17 0605 06/03/17 0506  NA 140 138  K 4.5 4.4  CL 110 106  CO2 21* 22  GLUCOSE 67 59*  BUN 46* 55*  CREATININE 2.41* 2.76*  CALCIUM 8.8* 8.5*   No results for input(s): PTH in the last 72 hours. Iron Studies: No results for input(s): IRON, TIBC, TRANSFERRIN, FERRITIN in the last 72 hours.  Studies/Results: No results found.  I have reviewed the patient's current medications.  Assessment/Plan: Problem #1 acute kidney injury superimposed on chronic.  Etiology was to be secondary to prerenal/ATN. Her creatinine is slightly higher than yesterday but overall asymptomatic. Patient still remains stage IV. Problem #2 chronic renal failure: Stage IV. Most likely secondary to diabetes/hypertension/age-related renal function loss. Problem #3 hypertension: Her blood pressure is reasonably controlled Problem #4 hyperkalemia: Possibly combination of high potassium intake/worsening of renal failure. Her potassium remains normal. Problem #5 history of gout: Uric acid is 8.8 high. Patient doesn't have recent activation of gout. Problem #6 anemia: Most likely secondary to  chronic renal failure. Previous workup for iron deficiency anemia was negative. Problem #7 diabetes Plan: 1] will continue with Demadex at present dose. 2] renal panel in the morning.   3] if patient is going to be discharged she has appointment to see a nephrologist in [place] on the 26th of this month and will advise to keep that appointment. 4] will start patient on Uloric 40 mg by mouth daily    LOS: 3 days   Channon Ambrosini S 06/03/2017,8:26 AM

## 2017-06-03 NOTE — Progress Notes (Signed)
Hypoglycemic Event  CBG: 59  Treatment: 15 GM carbohydrate snack  Symptoms: None  Follow-up CBG: Time:0700 CBG Result:50  Possible Reasons for Event: Medication regimen: Lantus 50 units given lastnight.  Comments/MD notified:Dr. Jerilee Hoh text paged for notification. Amy, RN aware.    Sylvie Farrier

## 2017-06-03 NOTE — Progress Notes (Signed)
Telemetry removed, IV removed previously by patient, tolerated well. Patient and family given discharge instructions at bedside.

## 2017-06-03 NOTE — Discharge Summary (Signed)
Physician Discharge Summary  Kristen Kaufman JAS:[id number] DOB: [birth year] DOA: 05/30/2017  PCP: Sherrilee Gilles, DO  Admit date: 05/30/2017 Discharge date: 06/03/2017  Time spent: 45 minutes  Recommendations for Outpatient Follow-up:  -Will be discharged home today. -Advised to follow up with nephrology as scheduled for later this month.   Discharge Diagnoses:  Principal Problem:   AKI (acute kidney injury) ([place]) Active Problems:   Diabetes mellitus type 2 in obese [facility])   Essential hypertension   Acute renal failure superimposed on stage 4 chronic kidney disease (HCC)   Anemia in stage 4 chronic kidney disease (HCC)   UTI (urinary tract infection), uncomplicated   Hyperkalemia   Discharge Condition: Stable and improved  Filed Weights   05/30/17 2104  Weight: 86.1 kg (189 lb 12.8 oz)    History of present illness:  As per Dr. Olevia Bowens on 6/12: Kristen Kaufman is a 83 y.o. female with medical history significant of chronic back pain, type 2 diabetes (last hemoglobin A1c 7.5% on 05/25/2017), gout, hypertension, CAD, history of MI, PUD, stage IV chronic kidney disease who was recently admitted from 05/24/2017 until 05/27/2017 for acute on chronic renal failure, hyperkalemia, Escherichia coli urinary tract infection and dyspnea who was referred by her PCP to the emergency department after the patient presented today for postdischarge follow-up and stating that she still felt like having UTI.   When evaluated in the emergency department, she complains of subjective fevers, frontal headache, fatigue and malaise, but denies chills, sore throat, productive cough, worsening dyspnea, chest pain, palpitations, dizziness, diaphoresis, pitting edema of the lower extremities. She complains of left foot pain and swelling secondary to gout. She denies nausea, emesis, diarrhea, constipation, melena, hemorrhoids or hematochezia. She complains of worsening chronic left-sided abdominal pain. She denies  dysuria, frequency or gross hematuria. However, she states that she has been urinating less volume since she was discharged from the hospital. She states that she drinks about 3 gallons of liquid every week.  ED Course: The patient received 1000 mL of normal saline bolus and 30 g of Kayexalate orally. Urinalysis showed proteinuria 100 mg/dL, rare bacteria, 0-5 RBC/WBC per hpf on microscopic exam. WBC 8.3 with a normal differential, hemoglobin 9.6 g/dL and platelets 282. Sodium 137, potassium 5.7, chloride 108, bicarbonate 20 and anion gap of 9 mmol/L. BUN was 56, creatinine 3.17 and glucose 184 mg/dL. EKG did not show any peaked T waves or major changes since previous one.  I spoke to the patient's son, who told me that he spoke with Dr. Florentina Addison office who asked him to tell us to request an inpatient nephrology consult while she is the hospital.   Hospital Course:   Acute on CKD Stage IV -Baseline Cr around 2.5. -Cr is at 2.7 today which is up from 2.4 yesterday. -Presumed due to ATN/prerenal causes. -Per renal ok to DC home on demadex. This is probably a new baseline for her. Has follow up scheduled later this month.  Air-Fluid level in Bladder/Pneumaturia -Seen on CT scan raising concern for a colovesicular fistula. -Seen by GU: they do not believe this is the case, but are recommending OP cystoscopy once discharged from the hospital.  Acute Gout -Improved.  DM -Well controlled.  Hyperkalemia -Resolved after keyexalate and lasix given yesterday.    Procedures:  None   Consultations:  Nephrology  Discharge Instructions  Discharge Instructions    Diet - low sodium heart healthy    Complete by:  As directed    Increase  activity slowly    Complete by:  As directed      Allergies as of 06/03/2017      Reactions   Influenza Vaccines Anaphylaxis   Menactra [meningococcal A C Y&w-135 Conj]       Medication List    STOP taking these medications   cefUROXime  250 MG tablet Commonly known as:  CEFTIN     TAKE these medications   amLODipine 10 MG tablet Commonly known as:  NORVASC Take 10 mg by mouth daily.   febuxostat 40 MG tablet Commonly known as:  ULORIC Take 1 tablet (40 mg total) by mouth daily. Start taking on:  06/04/2017   LANTUS 100 UNIT/ML injection Generic drug:  insulin glargine Inject 50 Units into the skin at bedtime.   methocarbamol 500 MG tablet Commonly known as:  ROBAXIN Take 500 mg by mouth 2 (two) times daily as needed for muscle spasms.   torsemide 20 MG tablet Commonly known as:  DEMADEX Take 1 tablet (20 mg total) by mouth daily. Start taking on:  06/04/2017      Allergies  Allergen Reactions  . Influenza Vaccines Anaphylaxis  . Menactra [Meningococcal A C Y&W-135 Conj]    Follow-up Information    Fran Lowes, MD Follow up in 4 week(s).   Specialty:  Nephrology Contact information: [address] [place] [address] [phone]            The results of significant diagnostics from this hospitalization (including imaging, microbiology, ancillary and laboratory) are listed below for reference.    Significant Diagnostic Studies: Ct Abdomen Pelvis Wo Contrast  Result Date: 05/30/2017 CLINICAL DATA:  Persistent urinary tract infection despite being on antibiotics x3 days. EXAM: CT ABDOMEN AND PELVIS WITHOUT CONTRAST TECHNIQUE: Multidetector CT imaging of the abdomen and pelvis was performed following the standard protocol without IV contrast. COMPARISON:  10/05/2016 FINDINGS: Lower chest: Stable mild cardiomegaly with trace pericardial effusion. Small hiatal hernia. There is atelectatic changes at each lung base and/or scarring. No effusion or pneumothorax. Hepatobiliary: No space-occupying mass of the liver. Granuloma in the right hepatic lobe. Contracted gallbladder without stones. No biliary dilatation. Pancreas: Atrophic pancreas without ductal dilatation or mass. No inflammation.  Spleen: Normal in size with punctate calcification seen. Adrenals/Urinary Tract: Normal bilateral adrenal glands. An 18 mm cyst in the upper pole the left kidney appears stable. No obstructive uropathy. A small amount of air is seen within the bladder which could be due to instrumentation. Given reported history of left lower quadrant pain and presence of sigmoid diverticulosis adjacent to the bladder, the possibility of an enterovesical fistula accounting for the air within the bladder cannot be entirely excluded though believed less likely given lack of acute inflammation currently identified. Correlation with urinalysis may prove helpful. Stomach/Bowel: Contrast distention of the stomach. Normal small bowel rotation without obstruction. Moderate colonic stool burden with scattered colonic diverticulosis most markedly affecting the sigmoid colon. No acute bowel inflammation is seen however. Normal-appearing appendix. Vascular/Lymphatic: Aortoiliac atherosclerosis without aneurysm. Small subcentimeter right lower quadrant and mesenteric lymph nodes are noted without pathologic enlargement. Reproductive: Hysterectomy.  No adnexal mass. Other: No abdominal wall hernia or abnormality. No abdominopelvic ascites. Musculoskeletal: No acute or significant osseous findings. Multilevel degenerative disc disease consistent thoracolumbar spondylosis IMPRESSION: 1. Colonic diverticulosis predominantly within the sigmoid colon without evidence of acute diverticulitis. 2. Stable appearance of the kidneys and ureters. There is an air-fluid level seen within the bladder which could be due to instrumentation. If the  patient's urinalysis happens to grow enteric bacteria, the remote possibility of a colovesical fistula accounting for the air should be entertained although currently believed less likely. 3. Tiny granulomas in the liver and spleen. 4. Stable left upper pole cyst measuring 18 mm. Electronically Signed   By: Ashley Royalty  M.D.   On: 05/30/2017 21:03   Dg Chest 2 View  Result Date: 05/25/2017 CLINICAL DATA:  Shortness of breath. EXAM: CHEST  2 VIEW COMPARISON:  10/05/2016 FINDINGS: Linear opacities which were also seen previously and consistent with scarring. Chronic cardiomegaly and aortic tortuosity. There is no edema, consolidation, effusion, or pneumothorax. Prominent spondylosis. No acute osseous finding IMPRESSION: 1. No acute finding when compared to prior. 2. Cardiomegaly and bilateral lung scarring. Electronically Signed   By: Monte Fantasia M.D.   On: 05/25/2017 14:22   US Renal  Result Date: 05/25/2017 CLINICAL DATA:  Acute on chronic renal failure. History of diabetes. EXAM: RENAL / URINARY TRACT ULTRASOUND COMPLETE COMPARISON:  Abdominopelvic CT scan of October 05, 2016 FINDINGS: Right Kidney: Length: 9.7 cm. The renal cortical echotexture is increased and is equal to or slightly greater than that of the adjacent liver. There is no hydronephrosis. There is no cystic or solid mass. Left Kidney: Length: 8.8 cm. The renal cortical echotexture is mildly increased similar to that on the right. There is no hydronephrosis. There is a 1.8 x 1.5 x 1.8 cm complex cystic mass in the upper pole cortex. In the lower pole there is a similar-appearing complex cystic structure measuring 0.8 x 0.6 x 0.8 cm. Bladder: The urinary bladder is only partially distended. Ureteral jets were not observed. IMPRESSION: Increased renal cortical echotexture bilaterally consistent with medical renal disease. There is no hydronephrosis. Complex cystic structures in the left kidney as described. These could be faintly demonstrated on the October 27 CT scan. Noncontrast renal protocol MRI is recommended. Limited visualization of the urinary bladder due to partial distention. Electronically Signed   By: David  Martinique M.D.   On: 05/25/2017 10:38    Microbiology: Recent Results (from the past 240 hour(s))  Culture, Urine     Status: Abnormal    Collection Time: 05/25/17  9:43 AM  Result Value Ref Range Status   Specimen Description URINE, CATHETERIZED  Final   Special Requests NONE  Final   Culture >=100,000 COLONIES/mL ESCHERICHIA COLI (A)  Final   Report Status 05/27/2017 FINAL  Final   Organism ID, Bacteria ESCHERICHIA COLI (A)  Final      Susceptibility   Escherichia coli - MIC*    AMPICILLIN <=2 SENSITIVE Sensitive     CEFAZOLIN <=4 SENSITIVE Sensitive     CEFTRIAXONE <=1 SENSITIVE Sensitive     CIPROFLOXACIN <=0.25 SENSITIVE Sensitive     GENTAMICIN <=1 SENSITIVE Sensitive     IMIPENEM <=0.25 SENSITIVE Sensitive     NITROFURANTOIN 64 INTERMEDIATE Intermediate     TRIMETH/SULFA <=20 SENSITIVE Sensitive     AMPICILLIN/SULBACTAM <=2 SENSITIVE Sensitive     PIP/TAZO <=4 SENSITIVE Sensitive     Extended ESBL NEGATIVE Sensitive     * >=100,000 COLONIES/mL ESCHERICHIA COLI     Labs: Basic Metabolic Panel:  Recent Labs Lab 05/30/17 1549 05/30/17 1553 05/31/17 0555 06/01/17 0555 06/02/17 0605 06/03/17 0506  NA 137  --  135 138 140 138  K 5.7*  --  5.2* 5.7* 4.5 4.4  CL 108  --  111 112* 110 106  CO2 20*  --  18* 19* 21* 22  GLUCOSE  184*  --  115* 88 67 59*  BUN 56*  --  48* 40* 46* 55*  CREATININE 3.17*  --  2.51* 2.26* 2.41* 2.76*  CALCIUM 8.6*  --  7.9* 8.6* 8.8* 8.5*  PHOS  --  4.0  --   --  3.9 4.0   Liver Function Tests:  Recent Labs Lab 05/31/17 0555 06/02/17 0605 06/03/17 0506  AST 12*  --   --   ALT 15  --   --   ALKPHOS 101  --   --   BILITOT 0.5  --   --   PROT 6.5  --   --   ALBUMIN 3.0* 3.4* 3.4*   No results for input(s): LIPASE, AMYLASE in the last 168 hours. No results for input(s): AMMONIA in the last 168 hours. CBC:  Recent Labs Lab 05/30/17 1549 05/31/17 0555 06/01/17 0555  WBC 8.3 7.1 6.3  NEUTROABS 5.3  --   --   HGB 9.6* 8.9* 9.7*  HCT 30.7* 28.0* 30.2*  MCV 96.8 96.2 95.6  PLT 282 246 294   Cardiac Enzymes: No results for input(s): CKTOTAL, CKMB, CKMBINDEX,  TROPONINI in the last 168 hours. BNP: BNP (last 3 results) No results for input(s): BNP in the last 8760 hours.  ProBNP (last 3 results) No results for input(s): PROBNP in the last 8760 hours.  CBG:  Recent Labs Lab 06/02/17 1625 06/02/17 2207 06/03/17 0700 06/03/17 0749 06/03/17 1144  GLUCAP 95 116* 50* 112* 202*       Signed:  HERNANDEZ ACOSTA,ESTELA  [facility] Pager: [phone] 06/03/2017, 6:25 PM

## 2017-06-03 NOTE — Progress Notes (Signed)
Hypoglycemic Event  CBG: 50  Treatment: 15 GM Carbohydrate Snack   Symptoms: none   Follow-up CBG: [id number] CBG Result:112  Possible Reasons for Event: QHS Lantus  Comments/MD notified: Hernandez   Kristen Kaufman

## 2017-06-13 ENCOUNTER — Observation Stay (HOSPITAL_COMMUNITY)
Admission: EM | Admit: 2017-06-13 | Discharge: 2017-06-15 | Disposition: A | Discharge status: Home / Self Care | Payer: Medicare PPO | Attending: Internal Medicine | Admitting: Internal Medicine

## 2017-06-13 ENCOUNTER — Encounter (HOSPITAL_COMMUNITY): Payer: Self-pay | Admitting: Emergency Medicine

## 2017-06-13 DIAGNOSIS — Z87891 Personal history of nicotine dependence: Secondary | ICD-10-CM | POA: Insufficient documentation

## 2017-06-13 DIAGNOSIS — Z794 Long term (current) use of insulin: Secondary | ICD-10-CM | POA: Insufficient documentation

## 2017-06-13 DIAGNOSIS — E1122 Type 2 diabetes mellitus with diabetic chronic kidney disease: Secondary | ICD-10-CM | POA: Insufficient documentation

## 2017-06-13 DIAGNOSIS — N184 Chronic kidney disease, stage 4 (severe): Secondary | ICD-10-CM | POA: Insufficient documentation

## 2017-06-13 DIAGNOSIS — E669 Obesity, unspecified: Secondary | ICD-10-CM

## 2017-06-13 DIAGNOSIS — I129 Hypertensive chronic kidney disease with stage 1 through stage 4 chronic kidney disease, or unspecified chronic kidney disease: Secondary | ICD-10-CM | POA: Diagnosis not present

## 2017-06-13 DIAGNOSIS — E1169 Type 2 diabetes mellitus with other specified complication: Secondary | ICD-10-CM | POA: Diagnosis present

## 2017-06-13 DIAGNOSIS — N179 Acute kidney failure, unspecified: Principal | ICD-10-CM | POA: Diagnosis present

## 2017-06-13 DIAGNOSIS — R531 Weakness: Secondary | ICD-10-CM | POA: Insufficient documentation

## 2017-06-13 DIAGNOSIS — R799 Abnormal finding of blood chemistry, unspecified: Secondary | ICD-10-CM | POA: Diagnosis present

## 2017-06-13 DIAGNOSIS — Z79899 Other long term (current) drug therapy: Secondary | ICD-10-CM | POA: Diagnosis not present

## 2017-06-13 DIAGNOSIS — D631 Anemia in chronic kidney disease: Secondary | ICD-10-CM | POA: Diagnosis present

## 2017-06-13 DIAGNOSIS — I10 Essential (primary) hypertension: Secondary | ICD-10-CM | POA: Diagnosis present

## 2017-06-13 DIAGNOSIS — K5900 Constipation, unspecified: Secondary | ICD-10-CM | POA: Diagnosis present

## 2017-06-13 LAB — CBC WITH DIFFERENTIAL/PLATELET
BASOS ABS: 0 10*3/uL (ref 0.0–0.1)
BASOS PCT: 0 %
EOS ABS: 0.1 10*3/uL (ref 0.0–0.7)
Eosinophils Relative: 2 %
HEMATOCRIT: 31.8 % — AB (ref 36.0–46.0)
Hemoglobin: 10.3 g/dL — ABNORMAL LOW (ref 12.0–15.0)
Lymphocytes Relative: 31 %
Lymphs Abs: 2.1 10*3/uL (ref 0.7–4.0)
MCH: 30.8 pg (ref 26.0–34.0)
MCHC: 32.4 g/dL (ref 30.0–36.0)
MCV: 95.2 fL (ref 78.0–100.0)
MONO ABS: 0.3 10*3/uL (ref 0.1–1.0)
Monocytes Relative: 5 %
NEUTROS ABS: 4.3 10*3/uL (ref 1.7–7.7)
Neutrophils Relative %: 62 %
PLATELETS: 298 10*3/uL (ref 150–400)
RBC: 3.34 MIL/uL — ABNORMAL LOW (ref 3.87–5.11)
RDW: 13.6 % (ref 11.5–15.5)
WBC: 6.8 10*3/uL (ref 4.0–10.5)

## 2017-06-13 LAB — GLUCOSE, CAPILLARY: Glucose-Capillary: 142 mg/dL — ABNORMAL HIGH (ref 65–99)

## 2017-06-13 LAB — URINALYSIS, ROUTINE W REFLEX MICROSCOPIC
BILIRUBIN URINE: NEGATIVE
GLUCOSE, UA: NEGATIVE mg/dL
HGB URINE DIPSTICK: NEGATIVE
KETONES UR: NEGATIVE mg/dL
NITRITE: NEGATIVE
Protein, ur: 30 mg/dL — AB
RBC / HPF: NONE SEEN RBC/hpf (ref 0–5)
Specific Gravity, Urine: 1.009 (ref 1.005–1.030)
pH: 5 (ref 5.0–8.0)

## 2017-06-13 LAB — COMPREHENSIVE METABOLIC PANEL
ALK PHOS: 105 U/L (ref 38–126)
ALT: 17 U/L (ref 14–54)
ANION GAP: 13 (ref 5–15)
AST: 13 U/L — ABNORMAL LOW (ref 15–41)
Albumin: 4.1 g/dL (ref 3.5–5.0)
BUN: 107 mg/dL — ABNORMAL HIGH (ref 6–20)
CALCIUM: 8.6 mg/dL — AB (ref 8.9–10.3)
CO2: 21 mmol/L — ABNORMAL LOW (ref 22–32)
Chloride: 100 mmol/L — ABNORMAL LOW (ref 101–111)
Creatinine, Ser: 4.02 mg/dL — ABNORMAL HIGH (ref 0.44–1.00)
GFR calc non Af Amer: 9 mL/min — ABNORMAL LOW (ref >60)
GFR, EST AFRICAN AMERICAN: 11 mL/min — AB (ref >60)
GLUCOSE: 198 mg/dL — AB (ref 65–99)
Potassium: 4.8 mmol/L (ref 3.5–5.1)
Sodium: 134 mmol/L — ABNORMAL LOW (ref 135–145)
Total Bilirubin: 0.6 mg/dL (ref 0.3–1.2)
Total Protein: 8.2 g/dL — ABNORMAL HIGH (ref 6.5–8.1)

## 2017-06-13 LAB — NA AND K (SODIUM & POTASSIUM), RAND UR
POTASSIUM UR: 19 mmol/L
SODIUM UR: 79 mmol/L

## 2017-06-13 LAB — CREATININE, URINE, RANDOM: CREATININE, URINE: 55.5 mg/dL

## 2017-06-13 MED ORDER — FEBUXOSTAT 40 MG PO TABS
40.0000 mg | ORAL_TABLET | Freq: Every day | ORAL | Status: DC
  Administered 2017-06-14 – 2017-06-15 (×2): 40 mg via ORAL
  Filled 2017-06-13 (×2): qty 1

## 2017-06-13 MED ORDER — SENNOSIDES-DOCUSATE SODIUM 8.6-50 MG PO TABS
1.0000 | ORAL_TABLET | Freq: Two times a day (BID) | ORAL | Status: DC
  Administered 2017-06-13 – 2017-06-15 (×4): 1 via ORAL
  Filled 2017-06-13 (×4): qty 1

## 2017-06-13 MED ORDER — SODIUM CHLORIDE 0.9 % IV BOLUS (SEPSIS)
1000.0000 mL | Freq: Once | INTRAVENOUS | Status: AC
  Administered 2017-06-13: 1000 mL via INTRAVENOUS

## 2017-06-13 MED ORDER — HEPARIN SODIUM (PORCINE) 5000 UNIT/ML IJ SOLN
5000.0000 [IU] | Freq: Three times a day (TID) | INTRAMUSCULAR | Status: DC
  Administered 2017-06-13 – 2017-06-15 (×5): 5000 [IU] via SUBCUTANEOUS
  Filled 2017-06-13 (×5): qty 1

## 2017-06-13 MED ORDER — AMLODIPINE BESYLATE 5 MG PO TABS
10.0000 mg | ORAL_TABLET | Freq: Every day | ORAL | Status: DC
  Administered 2017-06-14 – 2017-06-15 (×2): 10 mg via ORAL
  Filled 2017-06-13 (×2): qty 2

## 2017-06-13 MED ORDER — INSULIN GLARGINE 100 UNIT/ML ~~LOC~~ SOLN
50.0000 [IU] | Freq: Every day | SUBCUTANEOUS | Status: DC
  Administered 2017-06-13: 50 [IU] via SUBCUTANEOUS
  Filled 2017-06-13 (×2): qty 0.5

## 2017-06-13 MED ORDER — SODIUM CHLORIDE 0.9 % IV SOLN
INTRAVENOUS | Status: AC
  Administered 2017-06-13: 22:00:00 via INTRAVENOUS

## 2017-06-13 MED ORDER — INSULIN ASPART 100 UNIT/ML ~~LOC~~ SOLN
0.0000 [IU] | Freq: Three times a day (TID) | SUBCUTANEOUS | Status: DC
  Administered 2017-06-14: 2 [IU] via SUBCUTANEOUS
  Administered 2017-06-14: 1 [IU] via SUBCUTANEOUS

## 2017-06-13 MED ORDER — BISACODYL 10 MG RE SUPP
10.0000 mg | Freq: Every day | RECTAL | Status: DC | PRN
  Administered 2017-06-14: 10 mg via RECTAL
  Filled 2017-06-13: qty 1

## 2017-06-13 MED ORDER — METHOCARBAMOL 500 MG PO TABS: 500.0000 mg | ORAL_TABLET | Freq: Two times a day (BID) | ORAL | Status: DC | PRN

## 2017-06-13 NOTE — ED Provider Notes (Signed)
[facility] DEPT Provider Note   CSN: [id number] Arrival date & time: 06/13/17  1454     History   Chief Complaint Chief Complaint  Patient presents with  . Abnormal Lab    HPI Kristen Kaufman is a 83 y.o. female.  Patient states that she's been feeling weak and saw her doctor today and he repeated some labs and her kidney function was getting worse    Weakness  Primary symptoms include no focal weakness. This is a new problem. The current episode started more than 1 week ago. The problem has not changed since onset.There was no focality noted. There has been no fever. Pertinent negatives include no shortness of breath, no chest pain and no headaches. Associated medical issues do not include trauma.    Past Medical History:  Diagnosis Date  . Chronic back pain   . Diabetes mellitus without complication ([place])   . Gout   . Hypertension   . Myocardial infarct ([place])   . Ulcer    stomach    Patient Active Problem List   Diagnosis Date Noted  . AKI (acute kidney injury) ([place]) 05/30/2017  . UTI (urinary tract infection), uncomplicated 23/55/7322  . UTI (urinary tract infection) 05/25/2017  . Hyperkalemia 05/25/2017  . Acute renal failure superimposed on stage 4 chronic kidney disease ([place])   . Anemia in stage 4 chronic kidney disease ([place])   . Renal insufficiency 05/24/2017  . Diabetes mellitus type 2 in obese ([place]) 05/24/2017  . Essential hypertension 05/24/2017    Past Surgical History:  Procedure Laterality Date  . ABDOMINAL HYSTERECTOMY      OB History    No data available       Home Medications    Prior to Admission medications   Medication Sig Start Date End Date Taking? Authorizing Provider  amLODipine (NORVASC) 10 MG tablet Take 10 mg by mouth daily.   Yes [provider]  febuxostat (ULORIC) 40 MG tablet Take 1 tablet (40 mg total) by mouth daily. 06/04/17  Yes Erline Hau, MD  LANTUS 100 UNIT/ML injection Inject 50 Units  into the skin at bedtime.  09/28/16  Yes [provider]  methocarbamol (ROBAXIN) 500 MG tablet Take 500 mg by mouth 2 (two) times daily as needed for muscle spasms.   Yes [provider]  torsemide (DEMADEX) 20 MG tablet Take 1 tablet (20 mg total) by mouth daily. 06/04/17  Yes Erline Hau, MD    Family History Family History  Problem Relation Age of Onset  . Chronic Renal Failure Mother 56       required hemodialysis x 6 months  . Hypertension Mother   . Prostate cancer Brother     Social History Social History  Substance Use Topics  . Smoking status: Former Smoker    Years: 65.00    Quit date: 10/05/2006  . Smokeless tobacco: Never Used  . Alcohol use Yes     Comment: Quit in 2007 - h/o heavy use     Allergies   Influenza vaccines and Menactra [meningococcal a c y&w-135 conj]   Review of Systems Review of Systems  Constitutional: Negative for appetite change and fatigue.  HENT: Negative for congestion, ear discharge and sinus pressure.   Eyes: Negative for discharge.  Respiratory: Negative for cough and shortness of breath.   Cardiovascular: Negative for chest pain.  Gastrointestinal: Negative for abdominal pain and diarrhea.  Genitourinary: Negative for frequency and hematuria.  Musculoskeletal: Negative for back  pain.  Skin: Negative for rash.  Neurological: Positive for weakness. Negative for focal weakness, seizures and headaches.  Psychiatric/Behavioral: Negative for hallucinations.     Physical Exam Updated Vital Signs BP 115/70   Pulse 83   Temp 98.1 F (36.7 C) (Oral)   Resp 15   Ht 5\' 2"  (1.575 m)   Wt 85.7 kg (189 lb)   SpO2 97%   BMI 34.57 kg/m   Physical Exam  Constitutional: She is oriented to person, place, and time. She appears well-developed.  HENT:  Head: Normocephalic.  Eyes: Conjunctivae and EOM are normal. No scleral icterus.  Neck: Neck supple. No thyromegaly present.  Cardiovascular: Normal rate  and regular rhythm.  Exam reveals no gallop and no friction rub.   No murmur heard. Pulmonary/Chest: No stridor. She has no wheezes. She has no rales. She exhibits no tenderness.  Abdominal: She exhibits no distension. There is no tenderness. There is no rebound.  Musculoskeletal: Normal range of motion. She exhibits no edema.  Lymphadenopathy:    She has no cervical adenopathy.  Neurological: She is oriented to person, place, and time. She exhibits normal muscle tone. Coordination normal.  Skin: No rash noted. No erythema.  Psychiatric: She has a normal mood and affect. Her behavior is normal.     ED Treatments / Results  Labs (all labs ordered are listed, but only abnormal results are displayed) Labs Reviewed  COMPREHENSIVE METABOLIC PANEL - Abnormal; Notable for the following:       Result Value   Sodium 134 (*)    Chloride 100 (*)    CO2 21 (*)    Glucose, Bld 198 (*)    BUN 107 (*)    Creatinine, Ser 4.02 (*)    Calcium 8.6 (*)    Total Protein 8.2 (*)    AST 13 (*)    GFR calc non Af Amer 9 (*)    GFR calc Af Amer 11 (*)    All other components within normal limits  CBC WITH DIFFERENTIAL/PLATELET - Abnormal; Notable for the following:    RBC 3.34 (*)    Hemoglobin 10.3 (*)    HCT 31.8 (*)    All other components within normal limits  CREATININE, URINE, RANDOM  NA AND K (SODIUM & POTASSIUM), RAND UR  URINALYSIS, ROUTINE W REFLEX MICROSCOPIC    EKG  EKG Interpretation None       Radiology No results found.  Procedures Procedures (including critical care time)  Medications Ordered in ED Medications - No data to display   Initial Impression / Assessment and Plan / ED Course  I have reviewed the triage vital signs and the nursing notes.  Pertinent labs & imaging results that were available during my care of the patient were reviewed by me and considered in my medical decision making (see chart for details).     Patient with worsening renal disease.  She will be admitted for AKI  Final Clinical Impressions(s) / ED Diagnoses   Final diagnoses:  AKI (acute kidney injury) [facility])    New Prescriptions New Prescriptions   No medications on file     Milton Ferguson, MD 06/13/17 [id number]

## 2017-06-13 NOTE — H&P (Signed)
History and Physical    Kristen Kaufman ACZ:[id number] DOB: 1931/06/01 DOA: 06/13/2017  PCP: Sherrilee Gilles, DO   Patient coming from: Home.  I have personally briefly reviewed patient's old medical records in [place]  Chief Complaint: Abnormal lab report.  HPI: Kristen Kaufman is a 83 y.o. female with medical history significant of chronic back pain, type 2 diabetes, gout, hypertension, CAD, history of MI, PUD, stage IV chronic kidney disease who has been admitted twice this month for acute on chronic renal failure/AKI and returns today after being followed by her nephrologist in [place], [place], who recommended for the patient to come to the emergency department after having an elevated BUN and creatinine levels. The patient states that she only drinks 2 glasses of water or juice a day, sometimes 3 glasses, but she thinks she is not drinking enough fluids. She believes that she has stopped the oral torsemide. She denies headache, chest pain, dyspnea, palpitations, dizziness, diaphoresis, pitting edema of the lower extremities, PND, orthopnea, abdominal pain, nausea, emesis, diarrhea, melena or hematochezia. She complains of frequent constipation. She denies dysuria or frequency.  ED Course: Vital signs initially in the emergency department were temperature 98.20F, pulse 76, blood pressure 134/64 mmHg, respirations 18 and O2 sat 100% on room air. Her WBC was 6.8, hemoglobin 10.3 g/dL and platelets 298. Sodium 134, potassium 4.8, chloride 100 and bicarbonate 21 mmol/L. BUN was 107, creatinine 4.02 and glucose 198 mg/dL. She was given a normal saline 1000 mL bolus in the emergency department.  Review of Systems: As per HPI otherwise 10 point review of systems negative.    Past Medical History:  Diagnosis Date  . Chronic back pain   . Diabetes mellitus without complication ([place])   . Gout   . Hypertension   . Myocardial infarct ([place])   . Ulcer    stomach    Past Surgical History:    Procedure Laterality Date  . ABDOMINAL HYSTERECTOMY       reports that she quit smoking about 10 years ago. She quit after 65.00 years of use. She has never used smokeless tobacco. She reports that she drinks alcohol. She reports that she does not use drugs.  Allergies  Allergen Reactions  . Influenza Vaccines Anaphylaxis  . Menactra [Meningococcal A C Y&W-135 Conj]     Family History  Problem Relation Age of Onset  . Chronic Renal Failure Mother 64       required hemodialysis x 6 months  . Hypertension Mother   . Prostate cancer Brother     Prior to Admission medications   Medication Sig Start Date End Date Taking? Authorizing Provider  amLODipine (NORVASC) 10 MG tablet Take 10 mg by mouth daily.   Yes [provider]  febuxostat (ULORIC) 40 MG tablet Take 1 tablet (40 mg total) by mouth daily. 06/04/17  Yes Erline Hau, MD  LANTUS 100 UNIT/ML injection Inject 50 Units into the skin at bedtime.  09/28/16  Yes [provider]  methocarbamol (ROBAXIN) 500 MG tablet Take 500 mg by mouth 2 (two) times daily as needed for muscle spasms.   Yes [provider]  torsemide (DEMADEX) 20 MG tablet Take 1 tablet (20 mg total) by mouth daily. 06/04/17  Yes Erline Hau, MD    Physical Exam: Vitals:   06/13/17 1800 06/13/17 1830 06/13/17 1900 06/13/17 1915  BP: 115/70 (!) 100/46  (!) 143/76  Pulse: 83 83  86  Resp: 15 16  16  Temp:    97.8 F (36.6 C)  TempSrc:    Oral  SpO2: 97% 100%  100%  Weight:      Height:   5\' 2"  (1.575 m)     Constitutional: NAD, calm, comfortable Eyes: PERRL, lids and conjunctivae normal ENMT: Mucous membranes and lips are dry. Posterior pharynx clear of any exudate or lesions. Neck: normal, supple, no masses, no thyromegaly Respiratory: clear to auscultation bilaterally, no wheezing, no crackles. Normal respiratory effort. No accessory muscle use.  Cardiovascular: Regular rate and rhythm, no murmurs  / rubs / gallops. No extremity edema. 2+ pedal pulses. No carotid bruits.  Abdomen: no tenderness, no masses palpated. No hepatosplenomegaly. Bowel sounds positive.  Musculoskeletal: no clubbing / cyanosis.  Good ROM, no contractures. Normal muscle tone.  Skin: no rashes, lesions, ulcers. No induration Neurologic: CN 2-12 grossly intact. Sensation intact, DTR normal. Strength 5/5 in all 4.  Psychiatric: Normal judgment and insight. Alert and oriented x 4. Normal mood.    Labs on Admission: I have personally reviewed following labs and imaging studies  CBC:  Recent Labs Lab 06/13/17 1527  WBC 6.8  NEUTROABS 4.3  HGB 10.3*  HCT 31.8*  MCV 95.2  PLT 397   Basic Metabolic Panel:  Recent Labs Lab 06/13/17 1527  NA 134*  K 4.8  CL 100*  CO2 21*  GLUCOSE 198*  BUN 107*  CREATININE 4.02*  CALCIUM 8.6*   GFR: Estimated Creatinine Clearance: 10.6 mL/min (A) (by C-G formula based on SCr of 4.02 mg/dL (H)). Liver Function Tests:  Recent Labs Lab 06/13/17 1527  AST 13*  ALT 17  ALKPHOS 105  BILITOT 0.6  PROT 8.2*  ALBUMIN 4.1   No results for input(s): LIPASE, AMYLASE in the last 168 hours. No results for input(s): AMMONIA in the last 168 hours. Coagulation Profile: No results for input(s): INR, PROTIME in the last 168 hours. Cardiac Enzymes: No results for input(s): CKTOTAL, CKMB, CKMBINDEX, TROPONINI in the last 168 hours. BNP (last 3 results) No results for input(s): PROBNP in the last 8760 hours. HbA1C: No results for input(s): HGBA1C in the last 72 hours. CBG:  Recent Labs Lab 06/13/17 2129  GLUCAP 142*   Lipid Profile: No results for input(s): CHOL, HDL, LDLCALC, TRIG, CHOLHDL, LDLDIRECT in the last 72 hours. Thyroid Function Tests: No results for input(s): TSH, T4TOTAL, FREET4, T3FREE, THYROIDAB in the last 72 hours. Anemia Panel: No results for input(s): VITAMINB12, FOLATE, FERRITIN, TIBC, IRON, RETICCTPCT in the last 72 hours. Urine analysis:     Component Value Date/Time   COLORURINE STRAW (A) 06/13/2017 2250   APPEARANCEUR CLEAR 06/13/2017 2250   LABSPEC 1.009 06/13/2017 2250   PHURINE 5.0 06/13/2017 2250   GLUCOSEU NEGATIVE 06/13/2017 2250   HGBUR NEGATIVE 06/13/2017 2250   BILIRUBINUR NEGATIVE 06/13/2017 2250   [place] 06/13/2017 2250   PROTEINUR 30 (A) 06/13/2017 2250   NITRITE NEGATIVE 06/13/2017 2250   LEUKOCYTESUR SMALL (A) 06/13/2017 2250    Radiological Exams on Admission: No results found.  EKG: Independently reviewed. Vent. rate 80 BPM PR interval * ms QRS duration 84 ms QT/QTc 404/466 ms P-R-T axes 28 -24 86 Sinus rhythm Borderline left axis deviation Abnormal R-wave progression, early transition  Assessment/Plan Principal Problem:   AKI (acute kidney injury) ([place])   Acute renal failure superimposed on stage 4 chronic kidney disease ([place]) The patient has not been drinking more than 2 or 3 glasses of fluid every day. Apparently she has held the torsemide. Continue  gentle and time-limited IV hydration. Check urine creatinine, sodium and potassium. Check renal function and electrolytes in a.m.  Active Problems:   Diabetes mellitus type 2 in obese (HCC) Carbohydrate modified diet. Lantus 50 units SQ at bedtime. CBG monitoring with regular insulin sliding scale.    Essential hypertension Continue amlodipine 10 mg by mouth daily. Monitor blood pressure.      Anemia in stage 4 chronic kidney disease (HCC) Monitor hematocrit and hemoglobin.    Constipation Start stool softeners by mouth twice a day. Dulcolax 10 mg PR as needed for moderate to severe constipation.     DVT prophylaxis: Heparin SQ. Code Status: DO NOT RESUSCITATE/DO NOT INTUBATE. Family Communication:  Disposition Plan: Overnight IV hydration and renal function check in a.m. Consults called:  Admission status: Observation/telemetry.   Reubin Milan MD [facility] Pager [phone].  If 7PM-7AM,  please contact night-coverage [email or web address] Password [facility]  06/13/2017, 11:41 PM

## 2017-06-13 NOTE — ED Triage Notes (Signed)
Pt sent by [facility] nephrologist for evaluation of elevated BUN and Creatinine.  Pt was recently in hospital for AKI and this was her follow up.  Reported Creatinine of 3.0 and BUN>100.  Pt denies complaints.

## 2017-06-13 NOTE — ED Notes (Signed)
EKG given to Dr. Zammit 

## 2017-06-14 DIAGNOSIS — K5900 Constipation, unspecified: Secondary | ICD-10-CM

## 2017-06-14 DIAGNOSIS — N184 Chronic kidney disease, stage 4 (severe): Secondary | ICD-10-CM

## 2017-06-14 DIAGNOSIS — E1169 Type 2 diabetes mellitus with other specified complication: Secondary | ICD-10-CM | POA: Diagnosis not present

## 2017-06-14 DIAGNOSIS — N179 Acute kidney failure, unspecified: Secondary | ICD-10-CM

## 2017-06-14 DIAGNOSIS — I10 Essential (primary) hypertension: Secondary | ICD-10-CM | POA: Diagnosis not present

## 2017-06-14 DIAGNOSIS — E669 Obesity, unspecified: Secondary | ICD-10-CM

## 2017-06-14 DIAGNOSIS — D631 Anemia in chronic kidney disease: Secondary | ICD-10-CM

## 2017-06-14 LAB — BASIC METABOLIC PANEL
Anion gap: 11 (ref 5–15)
BUN: 110 mg/dL — AB (ref 6–20)
CO2: 19 mmol/L — ABNORMAL LOW (ref 22–32)
CREATININE: 3.25 mg/dL — AB (ref 0.44–1.00)
Calcium: 8.2 mg/dL — ABNORMAL LOW (ref 8.9–10.3)
Chloride: 109 mmol/L (ref 101–111)
GFR calc Af Amer: 14 mL/min — ABNORMAL LOW (ref >60)
GFR, EST NON AFRICAN AMERICAN: 12 mL/min — AB (ref >60)
GLUCOSE: 104 mg/dL — AB (ref 65–99)
Potassium: 4.4 mmol/L (ref 3.5–5.1)
SODIUM: 139 mmol/L (ref 135–145)

## 2017-06-14 LAB — CBC
HEMATOCRIT: 29.4 % — AB (ref 36.0–46.0)
Hemoglobin: 9.5 g/dL — ABNORMAL LOW (ref 12.0–15.0)
MCH: 30.7 pg (ref 26.0–34.0)
MCHC: 32.3 g/dL (ref 30.0–36.0)
MCV: 95.1 fL (ref 78.0–100.0)
PLATELETS: 272 10*3/uL (ref 150–400)
RBC: 3.09 MIL/uL — ABNORMAL LOW (ref 3.87–5.11)
RDW: 13.8 % (ref 11.5–15.5)
WBC: 5 10*3/uL (ref 4.0–10.5)

## 2017-06-14 LAB — GLUCOSE, CAPILLARY
Glucose-Capillary: 89 mg/dL (ref 65–99)
Glucose-Capillary: 140 mg/dL — ABNORMAL HIGH (ref 65–99)
Glucose-Capillary: 130 mg/dL — ABNORMAL HIGH (ref 65–99)
Glucose-Capillary: 81 mg/dL (ref 65–99)

## 2017-06-14 MED ORDER — POLYETHYLENE GLYCOL 3350 17 G PO PACK: 17.0000 g | PACK | Freq: Every day | ORAL | Status: DC

## 2017-06-14 MED ORDER — SODIUM CHLORIDE 0.9 % IV SOLN
INTRAVENOUS | Status: DC
  Administered 2017-06-14 – 2017-06-15 (×2): via INTRAVENOUS

## 2017-06-14 MED ORDER — INSULIN GLARGINE 100 UNIT/ML ~~LOC~~ SOLN
40.0000 [IU] | Freq: Every day | SUBCUTANEOUS | Status: DC
  Administered 2017-06-14: 40 [IU] via SUBCUTANEOUS
  Filled 2017-06-14 (×2): qty 0.4

## 2017-06-14 NOTE — Care Management Obs Status (Signed)
[place] NOTIFICATION   Patient Details  Name: Alanni Vader MRN: [id number] Date of Birth: 01/28/1931   Medicare Observation Status Notification Given:  Yes    Sherald Barge, RN 06/14/2017, 3:09 PM

## 2017-06-14 NOTE — Care Management Note (Signed)
Case Management Note  Patient Details  Name: Nilda Keathley MRN: [id number] Date of Birth: 03-Aug-1931  Subjective/Objective:                  Pt admitted with AKI. This is her 3rd admission for same in past 6 months, ? From poor PO intake. Pt live with boyfriend, has aid 20 hours per week to assist with ADL's. Uses a cane with ambulation, has walker if needed. She is not eligible for [facility], she refuses HH as she has had them in the past and found them not to be of any help. She communicates no needs at DC.   Action/Plan: CM will cont to follow. Will place dietician consult to determine intake needs and determine if pt is meeting them or how to go about doing that.   Expected Discharge Date:     06/15/2017             Expected Discharge Plan:  Home/Self Care  In-House Referral:  NA  Discharge planning Services  CM Consult  Post Acute Care Choice:  NA Choice offered to:  NA  Status of Service:  In process, will continue to follow  Sherald Barge, RN 06/14/2017, 3:09 PM

## 2017-06-14 NOTE — Progress Notes (Signed)
PT'S CBG 81, 40 units Lantus due. Checked with MD to ensure Lantus should be given, told to go ahead and give as the dose has already been reduced from 50 units.  Will continue to monitor.

## 2017-06-14 NOTE — Progress Notes (Signed)
PROGRESS NOTE  Keighley Deckman  BTD:[id number] DOB: 1931-06-04 DOA: 06/13/2017 PCP: Sherrilee Gilles, DO  Brief Narrative:   Kristen Kaufman is a 83 y.o. female with medical history significant of chronic back pain, type 2 diabetes, gout, hypertension, CAD, history of MI, PUD, stage IV chronic kidney disease who has been admitted twice this month for acute on chronic renal failure/AKI and was referred to the hospital again by her nephrologist due to elevated BUN and creatinine.  The patient states that she only drinks 2 glasses of water or juice a day, sometimes 3 glasses. She believes that she has stopped the oral torsemide but she is not sure.   BUN was 107, creatinine 4.02.  She was started on IVF and her creatinine has started to trend down.  She remains constipated and has generalized abdominal pain.    Assessment & Plan:   Principal Problem:   AKI (acute kidney injury) ([place]) Active Problems:   Diabetes mellitus type 2 in obese [facility])   Essential hypertension   Acute renal failure superimposed on stage 4 chronic kidney disease (HCC)   Anemia in stage 4 chronic kidney disease (HCC)   Constipation  AKI (acute kidney injury) ([place])   Acute renal failure superimposed on stage 4 chronic kidney disease ([place]) The patient has not been drinking more than 2 or 3 glasses of fluid every day. She is unsure about her torsemide Continue IVF -  Repeat BMP in AM  Active Problems:   Diabetes mellitus type 2 in obese ([place]), CBG 89 this morning Carbohydrate modified diet. Decrease to Lantus 40 units SQ at bedtime. CBG monitoring with regular insulin sliding scale.    Essential hypertension Continue amlodipine 10 mg by mouth daily. Monitor blood pressure.      Anemia in stage 4 chronic kidney disease (HCC) Monitor hematocrit and hemoglobin.    Constipation Continue stool softeners by mouth twice a day. Add miralax once daily Dulcolax 10 mg PR as needed for moderate to severe constipation.  DVT  prophylaxis:  heparin Code Status:  DNR Family Communication:  Patient alone Disposition Plan:  Likely home tomorrow if tolerating better oral intake and creatinine continuing to trend down.  She does not remember her nephrologist's name or clinic name.     Consultants:   none  Procedures:  none  Antimicrobials:  Anti-infectives    None       Subjective: Abdominal bloating and discomfort.  Unable to have a good BM.  Poor appetite/inability to tolerate much to drink.  Still voiding regularly.    Objective: Vitals:   06/13/17 1830 06/13/17 1900 06/13/17 1915 06/14/17 0509  BP: (!) 100/46  (!) 143/76 132/70  Pulse: 83  86 79  Resp: 16  16 16   Temp:   97.8 F (36.6 C) 97.6 F (36.4 C)  TempSrc:   Oral Oral  SpO2: 100%  100% 95%  Weight:      Height:  5\' 2"  (1.575 m)      Intake/Output Summary (Last 24 hours) at 06/14/17 1507 Last data filed at 06/14/17 0800  Gross per 24 hour  Intake            475.2 ml  Output             1800 ml  Net          -1324.8 ml   Filed Weights   06/13/17 1500  Weight: 85.7 kg (189 lb)    Examination:  General exam:  Adult female.  No acute distress.  HEENT:  NCAT, MMM Respiratory system: Clear to auscultation bilaterally Cardiovascular system: Regular rate and rhythm, normal S1/S2. No murmurs, rubs, gallops or clicks.  Warm extremities Gastrointestinal system: hyperactive bowel sounds, soft, moderately distended, mild TTP diffusely without rebound or guarding MSK:  Normal tone and bulk, no lower extremity edema Neuro:  Grossly intact    Data Reviewed: I have personally reviewed following labs and imaging studies  CBC:  Recent Labs Lab 06/13/17 1527 06/14/17 0624  WBC 6.8 5.0  NEUTROABS 4.3  --   HGB 10.3* 9.5*  HCT 31.8* 29.4*  MCV 95.2 95.1  PLT 298 681   Basic Metabolic Panel:  Recent Labs Lab 06/13/17 1527 06/14/17 0624  NA 134* 139  K 4.8 4.4  CL 100* 109  CO2 21* 19*  GLUCOSE 198* 104*  BUN 107* 110*    CREATININE 4.02* 3.25*  CALCIUM 8.6* 8.2*   GFR: Estimated Creatinine Clearance: 13.1 mL/min (A) (by C-G formula based on SCr of 3.25 mg/dL (H)). Liver Function Tests:  Recent Labs Lab 06/13/17 1527  AST 13*  ALT 17  ALKPHOS 105  BILITOT 0.6  PROT 8.2*  ALBUMIN 4.1   No results for input(s): LIPASE, AMYLASE in the last 168 hours. No results for input(s): AMMONIA in the last 168 hours. Coagulation Profile: No results for input(s): INR, PROTIME in the last 168 hours. Cardiac Enzymes: No results for input(s): CKTOTAL, CKMB, CKMBINDEX, TROPONINI in the last 168 hours. BNP (last 3 results) No results for input(s): PROBNP in the last 8760 hours. HbA1C: No results for input(s): HGBA1C in the last 72 hours. CBG:  Recent Labs Lab 06/13/17 2129 06/14/17 0752 06/14/17 1117  GLUCAP 142* 89 140*   Lipid Profile: No results for input(s): CHOL, HDL, LDLCALC, TRIG, CHOLHDL, LDLDIRECT in the last 72 hours. Thyroid Function Tests: No results for input(s): TSH, T4TOTAL, FREET4, T3FREE, THYROIDAB in the last 72 hours. Anemia Panel: No results for input(s): VITAMINB12, FOLATE, FERRITIN, TIBC, IRON, RETICCTPCT in the last 72 hours. Urine analysis:    Component Value Date/Time   COLORURINE STRAW (A) 06/13/2017 2250   APPEARANCEUR CLEAR 06/13/2017 2250   LABSPEC 1.009 06/13/2017 2250   PHURINE 5.0 06/13/2017 2250   GLUCOSEU NEGATIVE 06/13/2017 2250   HGBUR NEGATIVE 06/13/2017 2250   BILIRUBINUR NEGATIVE 06/13/2017 2250   KETONESUR NEGATIVE 06/13/2017 2250   PROTEINUR 30 (A) 06/13/2017 2250   NITRITE NEGATIVE 06/13/2017 2250   LEUKOCYTESUR SMALL (A) 06/13/2017 2250   Sepsis Labs: @LABRCNTIP (procalcitonin:4,lacticidven:4)  )No results found for this or any previous visit (from the past 240 hour(s)).    Radiology Studies: No results found.   Scheduled Meds: . amLODipine  10 mg Oral Daily  . febuxostat  40 mg Oral Daily  . heparin  5,000 Units Subcutaneous Q8H  . insulin  aspart  0-9 Units Subcutaneous TID WC  . insulin glargine  50 Units Subcutaneous QHS  . senna-docusate  1 tablet Oral BID   Continuous Infusions: . sodium chloride       LOS: 0 days    Time spent: 30 min    Janece Canterbury, MD [facility] Pager [phone]  If 7PM-7AM, please contact night-coverage [email or web address] Password [facility] 06/14/2017, 3:07 PM

## 2017-06-15 DIAGNOSIS — K5900 Constipation, unspecified: Secondary | ICD-10-CM | POA: Diagnosis not present

## 2017-06-15 DIAGNOSIS — N179 Acute kidney failure, unspecified: Secondary | ICD-10-CM | POA: Diagnosis not present

## 2017-06-15 DIAGNOSIS — E1169 Type 2 diabetes mellitus with other specified complication: Secondary | ICD-10-CM | POA: Diagnosis not present

## 2017-06-15 DIAGNOSIS — I10 Essential (primary) hypertension: Secondary | ICD-10-CM | POA: Diagnosis not present

## 2017-06-15 LAB — RENAL FUNCTION PANEL
Albumin: 3.5 g/dL (ref 3.5–5.0)
Anion gap: 9 (ref 5–15)
BUN: 87 mg/dL — AB (ref 6–20)
CHLORIDE: 112 mmol/L — AB (ref 101–111)
CO2: 18 mmol/L — AB (ref 22–32)
Calcium: 8.3 mg/dL — ABNORMAL LOW (ref 8.9–10.3)
Creatinine, Ser: 2.79 mg/dL — ABNORMAL HIGH (ref 0.44–1.00)
GFR calc Af Amer: 17 mL/min — ABNORMAL LOW (ref >60)
GFR calc non Af Amer: 15 mL/min — ABNORMAL LOW (ref >60)
GLUCOSE: 70 mg/dL (ref 65–99)
POTASSIUM: 4.1 mmol/L (ref 3.5–5.1)
Phosphorus: 3.8 mg/dL (ref 2.5–4.6)
Sodium: 139 mmol/L (ref 135–145)

## 2017-06-15 LAB — GLUCOSE, CAPILLARY: Glucose-Capillary: 71 mg/dL (ref 65–99)

## 2017-06-15 MED ORDER — SENNOSIDES-DOCUSATE SODIUM 8.6-50 MG PO TABS: 1.0000 | ORAL_TABLET | Freq: Two times a day (BID) | ORAL | 0 refills | Status: DC

## 2017-06-15 MED ORDER — INSULIN GLARGINE 100 UNIT/ML ~~LOC~~ SOLN: 30.0000 [IU] | Freq: Every day | SUBCUTANEOUS | 11 refills | Status: DC

## 2017-06-15 MED ORDER — BISACODYL 10 MG RE SUPP: 10.0000 mg | Freq: Every day | RECTAL | 0 refills | Status: DC | PRN

## 2017-06-15 MED ORDER — SODIUM BICARBONATE 650 MG PO TABS
1300.0000 mg | ORAL_TABLET | Freq: Two times a day (BID) | ORAL | Status: DC
  Administered 2017-06-15: 1300 mg via ORAL
  Filled 2017-06-15: qty 2

## 2017-06-15 MED ORDER — TORSEMIDE 20 MG PO TABS: 20.0000 mg | ORAL_TABLET | Freq: Every day | ORAL | 2 refills | Status: DC | PRN

## 2017-06-15 MED ORDER — POLYETHYLENE GLYCOL 3350 17 GM/SCOOP PO POWD: 17.0000 g | Freq: Every day | ORAL | 0 refills | Status: DC

## 2017-06-15 MED ORDER — SODIUM BICARBONATE 650 MG PO TABS: 650.0000 mg | ORAL_TABLET | Freq: Two times a day (BID) | ORAL | 0 refills | Status: DC

## 2017-06-15 NOTE — Discharge Summary (Signed)
Physician Discharge Summary  Ellisha Kaufman YCX:[id number] DOB: 1931-10-29 DOA: 06/13/2017  PCP: Sherrilee Gilles, DO  Admit date: 06/13/2017 Discharge date: 06/15/2017  Admitted From: Home  Disposition:  Home  Recommendations for Outpatient Follow-up:  1. Follow up on Tuesday for bloodwork at the nephrology office 2. Follow-up on July 10 at 8:30 AM with Dr. Lindalou Hose, nephrology 3. Please obtain BMP and CBC at next visit 4. Decreased torsemide to prn only and added small amount of sodium bicarbonate 5. Recommended aggressive stool regimen to prevent constipation  Home Health:  none  Equipment/Devices:  none  Discharge Condition:  Stable, improved CODE STATUS:  DO NOT RESUSCITATE  Diet recommendation:  Diabetic, low sodium   Brief/Interim Summary:  Kristen Kaufman a 83 y.o.femalewith medical history significant of chronic back pain, type 2 diabetes, gout, hypertension, CAD, history of MI, PUD, stage IV chronic kidney disease who has been admitted twice this month for acute on chronic renal failure/AKI and was referred to the hospital by her nephrologist due to elevated BUN and creatinine.  The patient states that she only drinks 2 glasses of water or juice a day, sometimes 3 glasses. She believes that she has stopped the oral torsemide but she is not sure.  She has poor appetite and struggles with chronic constipation which causes nausea and abdominal discomfort. At the time of admission, her BUN was 107, creatinine 4.02.  She was started on IVF and her BUN trended down to 87 and her creatinine trended down to 2.79. Based on iron previous blood work, her baseline creatinine may be somewhere around 2.3-2.4.  Recently, her CO2 has been between 18-21 and so I started her on sodium bicarbonate. I recommended that she stop her torsemide if she has not done so already and that she use it only if she develops significant lower extremity swelling or shortness of breath. She was advised to avoid ibuprofen,  Aleve, other NSAIDs. She should drink more water.   for her constipation, she received bisacodyl suppository, MiraLAX, Senokot-S and she had a bowel movement. She states it is sometimes 2 weeks between bowel movements. I recommended that she take MiraLAX once or twice a day every day to prevent constipation.  Discharge Diagnoses:  Principal Problem:   AKI (acute kidney injury) ([place]) Active Problems:   Diabetes mellitus type 2 in obese [facility])   Essential hypertension   Acute renal failure superimposed on stage 4 chronic kidney disease (HCC)   Anemia in stage 4 chronic kidney disease (HCC)   Constipation  AKI (acute kidney injury) ([place]) Acute renal failure superimposed on stage 4 chronic kidney disease ([place]), improved with IV fluids and holding torsemide.  Mild non-gap metabolic acidosis has persisted for the last month, but complicated because every admission has been for acute on chronic renal failure.  She also has a history of lower extremity swelling which could be worsened with sodium bicarbonate. - Change torsemide to when necessary only -  Encouraged additional oral fluids -  Started sodium bicarbonate 650 mg by mouth twice a day -  Repeat BMP on Tuesday at the nephrology office  Active Problems: Diabetes mellitus type 2 in obese [facility]), she will likely become more sensitive to her insulin as her kidney function worsens Carbohydrate modified diet. Decrease to Lantus 30 units SQ at bedtime. CBG monitoring with regular insulin sliding scale.  Essential hypertension Continue amlodipine 10 mg by mouth daily. Monitor blood pressure.  Anemia in stage 4 chronic kidney disease (HCC) Monitor hematocrit and hemoglobin.  Constipation -  Recommend MiraLAX daily or twice daily on a regular basis -  Continue Senokot-S and when necessary Dulcolax suppositories  Discharge Instructions  Discharge Instructions    Call MD for:  difficulty breathing, headache or visual  disturbances    Complete by:  As directed    Call MD for:  extreme fatigue    Complete by:  As directed    Call MD for:  hives    Complete by:  As directed    Call MD for:  persistant dizziness or light-headedness    Complete by:  As directed    Call MD for:  persistant nausea and vomiting    Complete by:  As directed    Call MD for:  severe uncontrolled pain    Complete by:  As directed    Call MD for:  temperature >100.4    Complete by:  As directed    Diet - low sodium heart healthy    Complete by:  As directed    Diet Carb Modified    Complete by:  As directed    Increase activity slowly    Complete by:  As directed        Medication List    TAKE these medications   amLODipine 10 MG tablet Commonly known as:  NORVASC Take 10 mg by mouth daily.   bisacodyl 10 MG suppository Commonly known as:  DULCOLAX Place 1 suppository (10 mg total) rectally daily as needed for mild constipation, moderate constipation or severe constipation.   febuxostat 40 MG tablet Commonly known as:  ULORIC Take 1 tablet (40 mg total) by mouth daily.   insulin glargine 100 UNIT/ML injection Commonly known as:  LANTUS Inject 0.3 mLs (30 Units total) into the skin at bedtime. What changed:  how much to take   methocarbamol 500 MG tablet Commonly known as:  ROBAXIN Take 500 mg by mouth 2 (two) times daily as needed for muscle spasms.   polyethylene glycol powder powder Commonly known as:  GLYCOLAX/MIRALAX Take 17 g by mouth daily.   senna-docusate 8.6-50 MG tablet Commonly known as:  Senokot-S Take 1 tablet by mouth 2 (two) times daily.   sodium bicarbonate 650 MG tablet Take 1 tablet (650 mg total) by mouth 2 (two) times daily.   torsemide 20 MG tablet Commonly known as:  DEMADEX Take 1 tablet (20 mg total) by mouth daily as needed. What changed:  when to take this  reasons to take this      Follow-up Information    Sherrilee Gilles, DO Follow up.   Specialty:  Family  Medicine Contact information: [address] [place] [address] [phone]        Theressa Stamps, MD. Go on 06/27/2017.   Why:  8:30AM Contact information: [address] [place] [address] [phone]        [facility]. Go on 06/20/2017.   Why:  10:30AM for bloodwork only.  Time is flexible, but lab closed between 12-1pm Contact information: [address] [place] [address]         Allergies  Allergen Reactions  . Influenza Vaccines Anaphylaxis  . Menactra [Meningococcal A C Y&W-135 Conj]     Consultations: None    Procedures/Studies: Ct Abdomen Pelvis Wo Contrast  Result Date: 05/30/2017 CLINICAL DATA:  Persistent urinary tract infection despite being on antibiotics x3 days. EXAM: CT ABDOMEN AND PELVIS WITHOUT CONTRAST TECHNIQUE: Multidetector CT imaging of the abdomen and pelvis was performed following the standard  protocol without IV contrast. COMPARISON:  10/05/2016 FINDINGS: Lower chest: Stable mild cardiomegaly with trace pericardial effusion. Small hiatal hernia. There is atelectatic changes at each lung base and/or scarring. No effusion or pneumothorax. Hepatobiliary: No space-occupying mass of the liver. Granuloma in the right hepatic lobe. Contracted gallbladder without stones. No biliary dilatation. Pancreas: Atrophic pancreas without ductal dilatation or mass. No inflammation. Spleen: Normal in size with punctate calcification seen. Adrenals/Urinary Tract: Normal bilateral adrenal glands. An 18 mm cyst in the upper pole the left kidney appears stable. No obstructive uropathy. A small amount of air is seen within the bladder which could be due to instrumentation. Given reported history of left lower quadrant pain and presence of sigmoid diverticulosis adjacent to the bladder, the possibility of an enterovesical fistula accounting for the air within the bladder cannot be entirely excluded though believed less likely given lack of acute inflammation currently  identified. Correlation with urinalysis may prove helpful. Stomach/Bowel: Contrast distention of the stomach. Normal small bowel rotation without obstruction. Moderate colonic stool burden with scattered colonic diverticulosis most markedly affecting the sigmoid colon. No acute bowel inflammation is seen however. Normal-appearing appendix. Vascular/Lymphatic: Aortoiliac atherosclerosis without aneurysm. Small subcentimeter right lower quadrant and mesenteric lymph nodes are noted without pathologic enlargement. Reproductive: Hysterectomy.  No adnexal mass. Other: No abdominal wall hernia or abnormality. No abdominopelvic ascites. Musculoskeletal: No acute or significant osseous findings. Multilevel degenerative disc disease consistent thoracolumbar spondylosis IMPRESSION: 1. Colonic diverticulosis predominantly within the sigmoid colon without evidence of acute diverticulitis. 2. Stable appearance of the kidneys and ureters. There is an air-fluid level seen within the bladder which could be due to instrumentation. If the patient's urinalysis happens to grow enteric bacteria, the remote possibility of a colovesical fistula accounting for the air should be entertained although currently believed less likely. 3. Tiny granulomas in the liver and spleen. 4. Stable left upper pole cyst measuring 18 mm. Electronically Signed   By: Ashley Royalty M.D.   On: 05/30/2017 21:03   Dg Chest 2 View  Result Date: 05/25/2017 CLINICAL DATA:  Shortness of breath. EXAM: CHEST  2 VIEW COMPARISON:  10/05/2016 FINDINGS: Linear opacities which were also seen previously and consistent with scarring. Chronic cardiomegaly and aortic tortuosity. There is no edema, consolidation, effusion, or pneumothorax. Prominent spondylosis. No acute osseous finding IMPRESSION: 1. No acute finding when compared to prior. 2. Cardiomegaly and bilateral lung scarring. Electronically Signed   By: Monte Fantasia M.D.   On: 05/25/2017 14:22   US  Renal  Result Date: 05/25/2017 CLINICAL DATA:  Acute on chronic renal failure. History of diabetes. EXAM: RENAL / URINARY TRACT ULTRASOUND COMPLETE COMPARISON:  Abdominopelvic CT scan of October 05, 2016 FINDINGS: Right Kidney: Length: 9.7 cm. The renal cortical echotexture is increased and is equal to or slightly greater than that of the adjacent liver. There is no hydronephrosis. There is no cystic or solid mass. Left Kidney: Length: 8.8 cm. The renal cortical echotexture is mildly increased similar to that on the right. There is no hydronephrosis. There is a 1.8 x 1.5 x 1.8 cm complex cystic mass in the upper pole cortex. In the lower pole there is a similar-appearing complex cystic structure measuring 0.8 x 0.6 x 0.8 cm. Bladder: The urinary bladder is only partially distended. Ureteral jets were not observed. IMPRESSION: Increased renal cortical echotexture bilaterally consistent with medical renal disease. There is no hydronephrosis. Complex cystic structures in the left kidney as described. These could be faintly demonstrated on the October  27 CT scan. Noncontrast renal protocol MRI is recommended. Limited visualization of the urinary bladder due to partial distention. Electronically Signed   By: David  Martinique M.D.   On: 05/25/2017 10:38   Subjective: Feeling much better after she had a large bowel movement. Denies shortness of breath, lower extremity swelling. Was able to drink more yesterday. Energy is better. Feels ready to go home today.  Discharge Exam: Vitals:   06/14/17 2100 06/15/17 0521  BP: 131/63 (!) 117/45  Pulse: 78 74  Resp:    Temp: 98.3 F (36.8 C) 97.7 F (36.5 C)   Vitals:   06/14/17 0509 06/14/17 1300 06/14/17 2100 06/15/17 0521  BP: 132/70 115/60 131/63 (!) 117/45  Pulse: 79 86 78 74  Resp: 16 18    Temp: 97.6 F (36.4 C) 97.8 F (36.6 C) 98.3 F (36.8 C) 97.7 F (36.5 C)  TempSrc: Oral Oral Oral Oral  SpO2: 95% 99% 98% 99%  Weight:      Height:         General: Thin female, no in acute distress Cardiovascular: RRR, S1/S2 +, no rubs, no gallops Respiratory: CTA bilaterally, no wheezing, no rhonchi Abdominal: Soft, NT, ND, bowel sounds + Extremities: no edema, no cyanosis    The results of significant diagnostics from this hospitalization (including imaging, microbiology, ancillary and laboratory) are listed below for reference.     Microbiology: No results found for this or any previous visit (from the past 240 hour(s)).   Labs: BNP (last 3 results) No results for input(s): BNP in the last 8760 hours. Basic Metabolic Panel:  Recent Labs Lab 06/13/17 1527 06/14/17 0624 06/15/17 0557  NA 134* 139 139  K 4.8 4.4 4.1  CL 100* 109 112*  CO2 21* 19* 18*  GLUCOSE 198* 104* 70  BUN 107* 110* 87*  CREATININE 4.02* 3.25* 2.79*  CALCIUM 8.6* 8.2* 8.3*  PHOS  --   --  3.8   Liver Function Tests:  Recent Labs Lab 06/13/17 1527 06/15/17 0557  AST 13*  --   ALT 17  --   ALKPHOS 105  --   BILITOT 0.6  --   PROT 8.2*  --   ALBUMIN 4.1 3.5   No results for input(s): LIPASE, AMYLASE in the last 168 hours. No results for input(s): AMMONIA in the last 168 hours. CBC:  Recent Labs Lab 06/13/17 1527 06/14/17 0624  WBC 6.8 5.0  NEUTROABS 4.3  --   HGB 10.3* 9.5*  HCT 31.8* 29.4*  MCV 95.2 95.1  PLT 298 272   Cardiac Enzymes: No results for input(s): CKTOTAL, CKMB, CKMBINDEX, TROPONINI in the last 168 hours. BNP: Invalid input(s): POCBNP CBG:  Recent Labs Lab 06/14/17 0752 06/14/17 1117 06/14/17 1619 06/14/17 2110 06/15/17 0732  GLUCAP 89 140* 130* 81 71   D-Dimer No results for input(s): DDIMER in the last 72 hours. Hgb A1c No results for input(s): HGBA1C in the last 72 hours. Lipid Profile No results for input(s): CHOL, HDL, LDLCALC, TRIG, CHOLHDL, LDLDIRECT in the last 72 hours. Thyroid function studies No results for input(s): TSH, T4TOTAL, T3FREE, THYROIDAB in the last 72 hours.  Invalid  input(s): FREET3 Anemia work up No results for input(s): VITAMINB12, FOLATE, FERRITIN, TIBC, IRON, RETICCTPCT in the last 72 hours. Urinalysis    Component Value Date/Time   COLORURINE STRAW (A) 06/13/2017 2250   APPEARANCEUR CLEAR 06/13/2017 2250   LABSPEC 1.009 06/13/2017 2250   PHURINE 5.0 06/13/2017 2250   GLUCOSEU NEGATIVE 06/13/2017 2250  HGBUR NEGATIVE 06/13/2017 2250   BILIRUBINUR NEGATIVE 06/13/2017 2250   KETONESUR NEGATIVE 06/13/2017 2250   PROTEINUR 30 (A) 06/13/2017 2250   NITRITE NEGATIVE 06/13/2017 2250   LEUKOCYTESUR SMALL (A) 06/13/2017 2250   Sepsis Labs Invalid input(s): PROCALCITONIN,  WBC,  LACTICIDVEN   Time coordinating discharge: Over 30 minutes  SIGNED:   Janece Canterbury, MD  [facility] 06/15/2017, 1:00 PM Pager   If 7PM-7AM, please contact night-coverage [email or web address] Password [email or web address]

## 2017-06-15 NOTE — Care Management Note (Signed)
Case Management Note  Patient Details  Name: Brileigh Sevcik MRN: [id number] Date of Birth: 04-11-1931   Expected Discharge Date:  06/15/17               Expected Discharge Plan:  Home/Self Care  In-House Referral:  NA  Discharge planning Services  CM Consult  Post Acute Care Choice:  NA Choice offered to:  NA  Status of Service:  Completed, signed off  Additional Comments: DC home today, No CM needs.  Sherald Barge, RN 06/15/2017, 10:27 AM

## 2017-06-15 NOTE — Discharge Instructions (Signed)
Acute Kidney Injury, Adult Acute kidney injury is a sudden worsening of kidney function. The kidneys are organs that have several jobs. They filter the blood to remove waste products and extra fluid. They also maintain a healthy balance of minerals and hormones in the body, which helps control blood pressure and keep bones strong. With this condition, your kidneys do not do their jobs as well as they should. This condition ranges from mild to severe. Over time it may develop into long-lasting (chronic) kidney disease. Early detection and treatment may prevent acute kidney injury from developing into a chronic condition. What are the causes? Common causes of this condition include:  A problem with blood flow to the kidneys. This may be caused by: ? Low blood pressure (hypotension) or shock. ? Blood loss. ? Heart and blood vessel (cardiovascular) disease. ? Severe burns. ? Liver disease.  Direct damage to the kidneys. This may be caused by: ? Certain medicines. ? A kidney infection. ? Poisoning. ? Being around or in contact with toxic substances. ? A surgical wound. ? A hard, direct hit to the kidney area.  A sudden blockage of urine flow. This may be caused by: ? Cancer. ? Kidney stones. ? An enlarged prostate in males.  What are the signs or symptoms? Symptoms of this condition may not be obvious until the condition becomes severe. Symptoms of this condition can include:  Tiredness (lethargy), or difficulty staying awake.  Nausea or vomiting.  Swelling (edema) of the face, legs, ankles, or feet.  Problems with urination, such as: ? Abdominal pain, or pain along the side of your stomach (flank). ? Decreased urine production. ? Decrease in the force of urine flow.  Muscle twitches and cramps, especially in the legs.  Confusion or trouble concentrating.  Loss of appetite.  Fever.  How is this diagnosed? This condition may be diagnosed with tests, including:  Blood  tests.  Urine tests.  Imaging tests.  A test in which a sample of tissue is removed from the kidneys to be examined under a microscope (kidney biopsy).  How is this treated? Treatment for this condition depends on the cause and how severe the condition is. In mild cases, treatment may not be needed. The kidneys may heal on their own. In more severe cases, treatment will involve:  Treating the cause of the kidney injury. This may involve changing any medicines you are taking or adjusting your dosage.  Fluids. You may need specialized IV fluids to balance your body's needs.  Having a catheter placed to drain urine and prevent blockages.  Preventing problems from occurring. This may mean avoiding certain medicines or procedures that can cause further injury to the kidneys.  In some cases treatment may also require:  A procedure to remove toxic wastes from the body (dialysis or continuous renal replacement therapy - CRRT).  Surgery. This may be done to repair a torn kidney, or to remove the blockage from the urinary system.  Follow these instructions at home: Medicines  Take over-the-counter and prescription medicines only as told by your health care provider.  Do not take any new medicines without your health care provider's approval. Many medicines can worsen your kidney damage.  Do not take any vitamin and mineral supplements without your health care provider's approval. Many nutritional supplements can worsen your kidney damage. Lifestyle  If your health care provider prescribed changes to your diet, follow them. You may need to decrease the amount of protein you eat.  Achieve  and maintain a healthy weight. If you need help with this, ask your health care provider.  Start or continue an exercise plan. Try to exercise at least 30 minutes a day, 5 days a week.  Do not use any tobacco products, such as cigarettes, chewing tobacco, and e-cigarettes. If you need help quitting, ask  your health care provider. General instructions  Keep track of your blood pressure. Report changes in your blood pressure as told by your health care provider.  Stay up to date with immunizations. Ask your health care provider which immunizations you need.  Keep all follow-up visits as told by your health care provider. This is important. Where to find more information:  American Association of Kidney Patients: BombTimer.gl  National Kidney Foundation: www.kidney.[place]: https://mathis.com/  Life Options Rehabilitation Program: ? www.lifeoptions.org ? www.kidneyschool.org Contact a health care provider if:  Your symptoms get worse.  You develop new symptoms. Get help right away if:  You develop symptoms of worsening kidney disease, which include: ? Headaches. ? Abnormally dark or light skin. ? Easy bruising. ? Frequent hiccups. ? Chest pain. ? Shortness of breath. ? End of menstruation in women. ? Seizures. ? Confusion or altered mental status. ? Abdominal or back pain. ? Itchiness.  You have a fever.  Your body is producing less urine.  You have pain or bleeding when you urinate. Summary  Acute kidney injury is a sudden worsening of kidney function.  Acute kidney injury can be caused by problems with blood flow to the kidneys, direct damage to the kidneys, and sudden blockage of urine flow.  Symptoms of this condition may not be obvious until it becomes severe. Symptoms may include edema, lethargy, confusion, nausea or vomiting, and problems passing urine.  This condition can usually be diagnosed with blood tests, urine tests, and imaging tests. Sometimes a kidney biopsy is done to diagnose this condition.  Treatment for this condition often involves treating the underlying cause. It is treated with fluids, medicines, dialysis, diet changes, or surgery. This information is not intended to replace advice given to you by your health care provider.  Make sure you discuss any questions you have with your health care provider. Document Released: 06/20/2011 Document Revised: 11/25/2016 Document Reviewed: 11/25/2016 Elsevier Interactive Patient Education  2017 [employer]   Chronic Kidney Disease, Adult Chronic kidney disease (CKD) happens when the kidneys are damaged during a time of 3 or more months. The kidneys are two organs that do many important jobs in the body. These jobs include:  Removing wastes and extra fluids from the blood.  Making hormones that maintain the amount of fluid in your tissues and blood vessels.  Making sure that the body has the right amount of fluids and chemicals.  Most of the time, this condition does not go away, but it can usually be controlled. Steps must be taken to slow down the kidney damage or stop it from getting worse. Otherwise, the kidneys may stop working. Follow these instructions at home:  Follow your diet as told by your doctor. You may need to avoid alcohol, salty foods (sodium), and foods that are high in potassium, calcium, and protein.  Take over-the-counter and prescription medicines only as told by your doctor. Do not take any new medicines unless your doctor says you can do that. These include vitamins and minerals. ? Medicines and nutritional supplements can make kidney damage worse. ? Your doctor may need to change how much medicine you take.  Do not use any tobacco products. These include cigarettes, chewing tobacco, and e-cigarettes. If you need help quitting, ask your doctor.  Keep all follow-up visits as told by your doctor. This is important.  Check your blood pressure. Tell your doctor if there are changes to your blood pressure.  Get to a healthy weight. Stay at that weight. If you need help with this, ask your doctor.  Start or continue an exercise plan. Try to exercise at least 30 minutes a day, 5 days a week.  Stay up-to-date with your shots (immunizations) as told  by your doctor. Contact a doctor if:  Your symptoms get worse.  You have new symptoms. Get help right away if:  You have symptoms of end-stage kidney disease. These include: ? Headaches. ? Skin that is darker or lighter than normal. ? Numbness in your hands or feet. ? Easy bruising. ? Having hiccups often. ? Chest pain. ? Shortness of breath. ? Stopping of menstrual periods in women.  You have a fever.  You are making very little pee (urine).  You have pain or bleeding when you pee (urinate). This information is not intended to replace advice given to you by your health care provider. Make sure you discuss any questions you have with your health care provider. Document Released: 03/01/2010 Document Revised: 05/12/2016 Document Reviewed: 08/03/2012 Elsevier Interactive Patient Education  2017 [employer].

## 2017-06-15 NOTE — Progress Notes (Signed)
Anselm Pancoast discharged Home per MD order.  Discharge instructions reviewed and discussed with the patient, all questions and concerns answered. Copy of instructions and scripts given to patient.  Allergies as of 06/15/2017      Reactions   Influenza Vaccines Anaphylaxis   Menactra [meningococcal A C Y&w-135 Conj]       Medication List    TAKE these medications   amLODipine 10 MG tablet Commonly known as:  NORVASC Take 10 mg by mouth daily.   bisacodyl 10 MG suppository Commonly known as:  DULCOLAX Place 1 suppository (10 mg total) rectally daily as needed for mild constipation, moderate constipation or severe constipation.   febuxostat 40 MG tablet Commonly known as:  ULORIC Take 1 tablet (40 mg total) by mouth daily.   insulin glargine 100 UNIT/ML injection Commonly known as:  LANTUS Inject 0.3 mLs (30 Units total) into the skin at bedtime. What changed:  how much to take   methocarbamol 500 MG tablet Commonly known as:  ROBAXIN Take 500 mg by mouth 2 (two) times daily as needed for muscle spasms.   polyethylene glycol powder powder Commonly known as:  GLYCOLAX/MIRALAX Take 17 g by mouth daily.   senna-docusate 8.6-50 MG tablet Commonly known as:  Senokot-S Take 1 tablet by mouth 2 (two) times daily.   sodium bicarbonate 650 MG tablet Take 1 tablet (650 mg total) by mouth 2 (two) times daily.   torsemide 20 MG tablet Commonly known as:  DEMADEX Take 1 tablet (20 mg total) by mouth daily as needed. What changed:  when to take this  reasons to take this       Patients skin is clean, dry and intact, no evidence of skin break down.  Patient escorted to car in a wheelchair,  no distress noted upon discharge.  Ralene Muskrat Elisheva Fallas 06/15/2017 11:31 AM

## 2018-02-13 ENCOUNTER — Encounter (HOSPITAL_COMMUNITY): Payer: Self-pay | Admitting: Emergency Medicine

## 2018-02-13 ENCOUNTER — Observation Stay (HOSPITAL_COMMUNITY)
Admission: EM | Admit: 2018-02-13 | Discharge: 2018-02-15 | Disposition: A | Discharge status: Home / Self Care | Payer: Medicare PPO | Attending: Internal Medicine | Admitting: Internal Medicine

## 2018-02-13 ENCOUNTER — Other Ambulatory Visit: Payer: Self-pay

## 2018-02-13 DIAGNOSIS — I2510 Atherosclerotic heart disease of native coronary artery without angina pectoris: Secondary | ICD-10-CM | POA: Diagnosis present

## 2018-02-13 DIAGNOSIS — E669 Obesity, unspecified: Secondary | ICD-10-CM

## 2018-02-13 DIAGNOSIS — E1122 Type 2 diabetes mellitus with diabetic chronic kidney disease: Secondary | ICD-10-CM | POA: Diagnosis not present

## 2018-02-13 DIAGNOSIS — Z794 Long term (current) use of insulin: Secondary | ICD-10-CM | POA: Insufficient documentation

## 2018-02-13 DIAGNOSIS — N179 Acute kidney failure, unspecified: Secondary | ICD-10-CM | POA: Diagnosis present

## 2018-02-13 DIAGNOSIS — I129 Hypertensive chronic kidney disease with stage 1 through stage 4 chronic kidney disease, or unspecified chronic kidney disease: Secondary | ICD-10-CM | POA: Diagnosis not present

## 2018-02-13 DIAGNOSIS — M25571 Pain in right ankle and joints of right foot: Secondary | ICD-10-CM

## 2018-02-13 DIAGNOSIS — Z87891 Personal history of nicotine dependence: Secondary | ICD-10-CM | POA: Insufficient documentation

## 2018-02-13 DIAGNOSIS — Z79899 Other long term (current) drug therapy: Secondary | ICD-10-CM | POA: Diagnosis not present

## 2018-02-13 DIAGNOSIS — M2540 Effusion, unspecified joint: Secondary | ICD-10-CM

## 2018-02-13 DIAGNOSIS — E872 Acidosis: Secondary | ICD-10-CM | POA: Diagnosis not present

## 2018-02-13 DIAGNOSIS — I252 Old myocardial infarction: Secondary | ICD-10-CM | POA: Diagnosis not present

## 2018-02-13 DIAGNOSIS — E1169 Type 2 diabetes mellitus with other specified complication: Secondary | ICD-10-CM | POA: Diagnosis present

## 2018-02-13 DIAGNOSIS — N184 Chronic kidney disease, stage 4 (severe): Secondary | ICD-10-CM | POA: Diagnosis not present

## 2018-02-13 DIAGNOSIS — M109 Gout, unspecified: Secondary | ICD-10-CM | POA: Diagnosis present

## 2018-02-13 DIAGNOSIS — D649 Anemia, unspecified: Secondary | ICD-10-CM | POA: Diagnosis not present

## 2018-02-13 DIAGNOSIS — R52 Pain, unspecified: Secondary | ICD-10-CM

## 2018-02-13 DIAGNOSIS — E875 Hyperkalemia: Secondary | ICD-10-CM | POA: Diagnosis not present

## 2018-02-13 DIAGNOSIS — D631 Anemia in chronic kidney disease: Secondary | ICD-10-CM | POA: Diagnosis present

## 2018-02-13 DIAGNOSIS — R2243 Localized swelling, mass and lump, lower limb, bilateral: Secondary | ICD-10-CM | POA: Diagnosis present

## 2018-02-13 DIAGNOSIS — I10 Essential (primary) hypertension: Secondary | ICD-10-CM | POA: Diagnosis present

## 2018-02-13 LAB — COMPREHENSIVE METABOLIC PANEL
ALK PHOS: 125 U/L (ref 38–126)
ALT: 19 U/L (ref 14–54)
ANION GAP: 11 (ref 5–15)
AST: 14 U/L — ABNORMAL LOW (ref 15–41)
Albumin: 4 g/dL (ref 3.5–5.0)
BILIRUBIN TOTAL: 0.2 mg/dL — AB (ref 0.3–1.2)
BUN: 69 mg/dL — ABNORMAL HIGH (ref 6–20)
CALCIUM: 9.1 mg/dL (ref 8.9–10.3)
CO2: 17 mmol/L — ABNORMAL LOW (ref 22–32)
Chloride: 113 mmol/L — ABNORMAL HIGH (ref 101–111)
Creatinine, Ser: 3.78 mg/dL — ABNORMAL HIGH (ref 0.44–1.00)
GFR, EST AFRICAN AMERICAN: 12 mL/min — AB (ref >60)
GFR, EST NON AFRICAN AMERICAN: 10 mL/min — AB (ref >60)
Glucose, Bld: 118 mg/dL — ABNORMAL HIGH (ref 65–99)
Potassium: 5.7 mmol/L — ABNORMAL HIGH (ref 3.5–5.1)
Sodium: 141 mmol/L (ref 135–145)
TOTAL PROTEIN: 8.2 g/dL — AB (ref 6.5–8.1)

## 2018-02-13 LAB — CBC
HCT: 32.1 % — ABNORMAL LOW (ref 36.0–46.0)
HEMOGLOBIN: 9.6 g/dL — AB (ref 12.0–15.0)
MCH: 29.6 pg (ref 26.0–34.0)
MCHC: 29.9 g/dL — ABNORMAL LOW (ref 30.0–36.0)
MCV: 99.1 fL (ref 78.0–100.0)
Platelets: 275 10*3/uL (ref 150–400)
RBC: 3.24 MIL/uL — AB (ref 3.87–5.11)
RDW: 13.4 % (ref 11.5–15.5)
WBC: 7.8 10*3/uL (ref 4.0–10.5)

## 2018-02-13 LAB — LIPASE, BLOOD: Lipase: 36 U/L (ref 11–51)

## 2018-02-13 MED ORDER — ENOXAPARIN SODIUM 30 MG/0.3ML ~~LOC~~ SOLN
30.0000 mg | SUBCUTANEOUS | Status: DC
  Administered 2018-02-14: 30 mg via SUBCUTANEOUS
  Filled 2018-02-13: qty 0.3

## 2018-02-13 MED ORDER — ACETAMINOPHEN 650 MG RE SUPP: 650.0000 mg | Freq: Four times a day (QID) | RECTAL | Status: DC | PRN

## 2018-02-13 MED ORDER — ACETAMINOPHEN 325 MG PO TABS
650.0000 mg | ORAL_TABLET | Freq: Four times a day (QID) | ORAL | Status: DC | PRN
  Administered 2018-02-15: 650 mg via ORAL
  Filled 2018-02-13: qty 2

## 2018-02-13 MED ORDER — SODIUM POLYSTYRENE SULFONATE 15 GM/60ML PO SUSP
15.0000 g | Freq: Once | ORAL | Status: AC
  Administered 2018-02-13: 15 g via ORAL
  Filled 2018-02-13: qty 60

## 2018-02-13 MED ORDER — SODIUM CHLORIDE 0.9 % IV BOLUS (SEPSIS)
1000.0000 mL | Freq: Once | INTRAVENOUS | Status: AC
  Administered 2018-02-13: 1000 mL via INTRAVENOUS

## 2018-02-13 MED ORDER — ONDANSETRON HCL 4 MG PO TABS: 4.0000 mg | ORAL_TABLET | Freq: Four times a day (QID) | ORAL | Status: DC | PRN

## 2018-02-13 MED ORDER — ONDANSETRON HCL 4 MG/2ML IJ SOLN: 4.0000 mg | Freq: Four times a day (QID) | INTRAMUSCULAR | Status: DC | PRN

## 2018-02-13 NOTE — ED Notes (Addendum)
Have put a bedside commode in pt.'s room Notified her to call if she needs assistance standing up

## 2018-02-13 NOTE — ED Provider Notes (Signed)
Emergency Department Provider Note   I have reviewed the triage vital signs and the nursing notes.   HISTORY  Chief Complaint Abnormal Lab   HPI Kristen Kaufman is a 84 y.o. female with PMH of DM, HTN, CAD, and CKD presents to the emergency department for evaluation of worsening leg swelling, muscle cramping, and elevated creatinine from her PCP.  Patient states that she saw her PCP today who ran low kidney function and potassium were increased from her baseline values.  She was encouraged to present to the emergency department for further evaluation.  The patient states she continues to urinate.  She is no longer taking torsemide because she ran out of refills.  She reports drinking very little water.  She states she mainly drinks punch and Kool-Aid when she is thirsty.  She reports following up with a nephrologist after her last admission for acute kidney injury. She has never been on HD.  She notes worsening swelling in both feet and some pain in the left knee that is typical of her gout.  She has also been taking ibuprofen for her gout pain and had her PCP call in medication for gout but she cannot recall the name.    Past Medical History:  Diagnosis Date  . Chronic back pain   . Diabetes mellitus without complication ([place])   . Gout   . Hypertension   . Myocardial infarct ([place])   . Ulcer    stomach    Patient Active Problem List   Diagnosis Date Noted  . Gout 02/13/2018  . CAD (coronary artery disease) 02/13/2018  . Constipation 06/13/2017  . AKI (acute kidney injury) ([place]) 05/30/2017  . UTI (urinary tract infection), uncomplicated 02/77/4128  . UTI (urinary tract infection) 05/25/2017  . Hyperkalemia 05/25/2017  . Acute renal failure superimposed on stage 4 chronic kidney disease ([place])   . Anemia in stage 4 chronic kidney disease ([place])   . Renal insufficiency 05/24/2017  . Diabetes mellitus type 2 in obese ([place]) 05/24/2017  . Essential hypertension 05/24/2017    Past  Surgical History:  Procedure Laterality Date  . ABDOMINAL HYSTERECTOMY      Current Outpatient Rx  . Order #: [id number] Class: Historical Med  . Order #: [id number] Class: Historical Med  . Order #: [id number] Class: Normal  . Order #: [id number] Class: Print  . Order #: [id number] Class: Normal  . Order #: [id number] Class: Historical Med  . Order #: [id number] Class: Normal  . Order #: [id number] Class: Normal  . Order #: [id number] Class: Normal  . Order #: [id number] Class: Normal    Allergies Influenza vaccines and Menactra [meningococcal a c y&w-135 conj]  Family History  Problem Relation Age of Onset  . Chronic Renal Failure Mother 25       required hemodialysis x 6 months  . Hypertension Mother   . Prostate cancer Brother     Social History Social History   Tobacco Use  . Smoking status: Former Smoker    Years: 65.00    Last attempt to quit: 10/05/2006    Years since quitting: 11.3  . Smokeless tobacco: Never Used  Substance Use Topics  . Alcohol use: Yes    Comment: Quit in 2007 - h/o heavy use  . Drug use: No    Review of Systems  Constitutional: No fever/chills Eyes: No visual changes. ENT: No sore throat. Cardiovascular: Denies chest pain. Positive LE edema.  Respiratory: Denies shortness of breath. Gastrointestinal: No abdominal pain.  No nausea, no vomiting.  No diarrhea.  No constipation. Genitourinary: Negative for dysuria. Musculoskeletal: Negative for back pain. Positive muscle cramping.  Skin: Negative for rash. Neurological: Negative for headaches, focal weakness or numbness.  10-point ROS otherwise negative.  ____________________________________________   PHYSICAL EXAM:  VITAL SIGNS: ED Triage Vitals  Enc Vitals Group     BP 02/13/18 1640 117/63     Pulse Rate 02/13/18 1640 76     Resp 02/13/18 1640 18     Temp 02/13/18 1640 98.5 F (36.9 C)     Temp Source 02/13/18 1640 Oral     SpO2 02/13/18 1640 95 %     Weight 02/13/18 1640 178 lb (80.7  kg)     Height 02/13/18 1640 5\' 3"  (1.6 m)     Pain Score 02/13/18 1643 10   Constitutional: Alert and oriented. Well appearing and in no acute distress. Eyes: Conjunctivae are normal. Head: Atraumatic. Nose: No congestion/rhinnorhea. Mouth/Throat: Mucous membranes are dry.  Neck: No stridor.  Cardiovascular: Normal rate, regular rhythm. Good peripheral circulation. Grossly normal heart sounds.   Respiratory: Normal respiratory effort.  No retractions. Lungs CTAB. Gastrointestinal: Soft and nontender. No distention.  Musculoskeletal: B/L LE edema with left knee pain with mild erythema. No cellulitis or abscess noted.  Neurologic:  Normal speech and language. No gross focal neurologic deficits are appreciated.  Skin:  Skin is warm, dry and intact. No rash noted.  ____________________________________________   LABS (all labs ordered are listed, but only abnormal results are displayed)  Labs Reviewed  COMPREHENSIVE METABOLIC PANEL - Abnormal; Notable for the following components:      Result Value   Potassium 5.7 (*)    Chloride 113 (*)    CO2 17 (*)    Glucose, Bld 118 (*)    BUN 69 (*)    Creatinine, Ser 3.78 (*)    Total Protein 8.2 (*)    AST 14 (*)    Total Bilirubin 0.2 (*)    GFR calc non Af Amer 10 (*)    GFR calc Af Amer 12 (*)    All other components within normal limits  CBC - Abnormal; Notable for the following components:   RBC 3.24 (*)    Hemoglobin 9.6 (*)    HCT 32.1 (*)    MCHC 29.9 (*)    All other components within normal limits  CBC - Abnormal; Notable for the following components:   RBC 3.00 (*)    Hemoglobin 9.0 (*)    HCT 29.5 (*)    All other components within normal limits  BASIC METABOLIC PANEL - Abnormal; Notable for the following components:   Potassium 5.3 (*)    Chloride 115 (*)    CO2 17 (*)    Glucose, Bld 116 (*)    BUN 69 (*)    Creatinine, Ser 3.34 (*)    Calcium 8.7 (*)    GFR calc non Af Amer 12 (*)    GFR calc Af Amer 14 (*)     All other components within normal limits  URIC ACID - Abnormal; Notable for the following components:   Uric Acid, Serum 8.0 (*)    All other components within normal limits  CBG MONITORING, ED - Abnormal; Notable for the following components:   Glucose-Capillary 102 (*)    All other components within normal limits  LIPASE, BLOOD   ____________________________________________  EKG   EKG Interpretation  Date/Time:  Tuesday February 13 2018 21:46:38 EST Ventricular Rate:  83 PR Interval:  QRS Duration: 81 QT Interval:  390 QTC Calculation: 459 R Axis:   -29 Text Interpretation:  Sinus rhythm Borderline left axis deviation Low voltage, precordial leads No STEMI Confirmed by Nanda Quinton [phone]) on 02/13/2018 11:18:00 PM       ____________________________________________  RADIOLOGY  Dg Ankle 2 Views Left  Result Date: 02/14/2018 CLINICAL DATA:  84 year old female with right ankle pain. EXAM: LEFT ANKLE - 2 VIEW COMPARISON:  None. FINDINGS: There is no acute fracture or dislocation. The bones are osteopenic. No significant arthritic changes. There is a 1 cm plantar calcaneal spur and a 1 cm posterior calcaneal spur at the insertion of the Achilles tendon. The soft tissues are unremarkable. IMPRESSION: No acute fracture or dislocation. Electronically Signed   By: Anner Crete M.D.   On: 02/14/2018 06:50    ____________________________________________   PROCEDURES  Procedure(s) performed:   Procedures  None ____________________________________________   INITIAL IMPRESSION / ASSESSMENT AND PLAN / ED COURSE  Pertinent labs & imaging results that were available during my care of the patient were reviewed by me and considered in my medical decision making (see chart for details).  Patient presents to the emergency department for evaluation of acute on chronic kidney injury with hyperkalemia.  We have ordered an EKG in addition to labs which were drawn in triage.   The patient's potassium is 5.7.  She notes worsening lower extremity swelling and muscle cramping.  No shortness of breath.  Patient does not appear to be in need of emergent dialysis.  Patient also with left knee pain consistent with prior gout flares.  Very low suspicion for any kind of septic arthritis. Patient appears dehydrated. Plan for IVF and Kayexalate.   Discussed patient's case with hospitalist, Dr. Olevia Bowens to request admission. Patient and family (if present) updated with plan. Care transferred to hospitalist service.  I reviewed all nursing notes, vitals, pertinent old records, EKGs, labs, imaging (as available).  ____________________________________________  FINAL CLINICAL IMPRESSION(S) / ED DIAGNOSES  Final diagnoses:  AKI (acute kidney injury) ([place])  Hyperkalemia     MEDICATIONS GIVEN DURING THIS VISIT:  Medications  enoxaparin (LOVENOX) injection 30 mg (30 mg Subcutaneous Given 02/14/18 0016)  ondansetron (ZOFRAN) tablet 4 mg (not administered)    Or  ondansetron (ZOFRAN) injection 4 mg (not administered)  acetaminophen (TYLENOL) tablet 650 mg (not administered)    Or  acetaminophen (TYLENOL) suppository 650 mg (not administered)  bisacodyl (DULCOLAX) suppository 10 mg (not administered)  sodium bicarbonate tablet 650 mg (not administered)  sodium chloride 0.9 % bolus 1,000 mL (0 mLs Intravenous Stopped 02/14/18 0016)  sodium polystyrene (KAYEXALATE) 15 GM/60ML suspension 15 g (15 g Oral Given 02/13/18 2146)  sodium polystyrene (KAYEXALATE) 15 GM/60ML suspension 15 g (15 g Oral Given 02/14/18 0145)    Note:  This document was prepared using Dragon voice recognition software and may include unintentional dictation errors.  Nanda Quinton, MD Emergency Medicine    Breah Joa, Wonda [facility], MD 02/14/18 [phone]

## 2018-02-13 NOTE — ED Triage Notes (Signed)
Sent by pcp for abnormal labs.  States pcp told her to come because something is wrong with her kidneys.

## 2018-02-13 NOTE — H&P (Signed)
History and Physical    Kristen Kaufman XTG:[id number] DOB: [birth year] DOA: 02/13/2018  PCP: Sherrilee Gilles, DO   Patient coming from: Referral from PCPs office.  I have personally briefly reviewed patient's old medical records in [place]  Chief Complaint: Abnormal labs.  HPI: Kristen Kaufman is a 84 y.o. female with medical history significant of chronic back pain, type 2 diabetes, gout, hypertension, CAD/history of MI, PUD, stage IV chronic kidney disease who is being referred by her PCP due to hyperkalemia.  She denies fever, chills, headache, sore throat, dyspnea, cough, wheezing, hemoptysis, chest pain, palpitations, dizziness, lower extremity edema, abdominal pain, nausea, emesis, diarrhea, constipation, melena or hematochezia.  No dysuria or hematuria.  No heat or cold intolerance.  No polyuria, polydipsia or blurred vision.  She states that she has been off medications for over a month now and feels much better.  She does not seem to be planning to resume medications.  She had now follow with her physician until earlier today.  ED Course: Initial vital signs temperature 98.5, pulse 76, respirations 18, blood pressure 117/63 mmHg and O2 sat 95% on room air.  Her lab work shows white count of 7.8, hemoglobin 9.6 and platelets 275.  Her sodium 141, potassium 5.7, chloride 113 and CO2 17 mmol/L.  Her glucose was 118, BUN 69, creatinine 3.78 and calcium 9.1 mg/dL.  Her LFTs showed an elevated total protein at 8.2 g/dL, a low AST and low total bilirubin level.  All other hepatic panel values are normal.  Her lipase was 36.  Review of Systems: As per HPI otherwise 10 point review of systems negative.    Past Medical History:  Diagnosis Date  . Chronic back pain   . Diabetes mellitus without complication ([place])   . Gout   . Hypertension   . Myocardial infarct ([place])   . Ulcer    stomach    Past Surgical History:  Procedure Laterality Date  . ABDOMINAL HYSTERECTOMY       reports that she quit smoking about 11 years ago. She quit after 65.00 years of use. she has never used smokeless tobacco. She reports that she drinks alcohol. She reports that she does not use drugs.  Allergies  Allergen Reactions  . Influenza Vaccines Anaphylaxis  . Menactra [Meningococcal A C Y&W-135 Conj]     Family History  Problem Relation Age of Onset  . Chronic Renal Failure Mother 59       required hemodialysis x 6 months  . Hypertension Mother   . Prostate cancer Brother     Prior to Admission medications   Medication Sig Start Date End Date Taking? Authorizing Provider  acetaminophen (TYLENOL) 500 MG tablet Take 500 mg by mouth every 6 (six) hours as needed for mild pain or moderate pain.   Yes [provider]  amLODipine (NORVASC) 10 MG tablet Take 10 mg by mouth daily.    [provider]  bisacodyl (DULCOLAX) 10 MG suppository Place 1 suppository (10 mg total) rectally daily as needed for mild constipation, moderate constipation or severe constipation. Patient not taking: Reported on 02/13/2018 06/15/17   Janece Canterbury, MD  febuxostat (ULORIC) 40 MG tablet Take 1 tablet (40 mg total) by mouth daily. Patient not taking: Reported on 02/13/2018 06/04/17   Isaac Bliss, Rayford Halsted, MD  insulin glargine (LANTUS) 100 UNIT/ML injection Inject 0.3 mLs (30 Units total) into the skin at bedtime. Patient not taking: Reported on 02/13/2018 06/15/17   Janece Canterbury,  MD  methocarbamol (ROBAXIN) 500 MG tablet Take 500 mg by mouth 2 (two) times daily as needed for muscle spasms.    [provider]  polyethylene glycol powder (GLYCOLAX/MIRALAX) powder Take 17 g by mouth daily. Patient not taking: Reported on 02/13/2018 06/15/17   Janece Canterbury, MD  senna-docusate (SENOKOT-S) 8.6-50 MG tablet Take 1 tablet by mouth 2 (two) times daily. Patient not taking: Reported on 02/13/2018 06/15/17   Janece Canterbury, MD  sodium bicarbonate 650 MG tablet Take 1 tablet (650  mg total) by mouth 2 (two) times daily. Patient not taking: Reported on 02/13/2018 06/15/17   Janece Canterbury, MD  torsemide (DEMADEX) 20 MG tablet Take 1 tablet (20 mg total) by mouth daily as needed. Patient not taking: Reported on 02/13/2018 06/15/17   Janece Canterbury, MD    Physical Exam: Vitals:   02/13/18 1640 02/13/18 2134  BP: 117/63 135/72  Pulse: 76 80  Resp: 18 17  Temp: 98.5 F (36.9 C)   TempSrc: Oral   SpO2: 95% 98%  Weight: 80.7 kg (178 lb)   Height: 5\' 3"  (1.6 m)     Constitutional: NAD, calm, comfortable Eyes: PERRL, lids and conjunctivae normal ENMT: Mucous membranes are moist. Posterior pharynx clear of any exudate or lesions. Neck: normal, supple, no masses, no thyromegaly Respiratory: clear to auscultation bilaterally, no wheezing, no crackles. Normal respiratory effort. No accessory muscle use.  Cardiovascular: Regular rate and rhythm, no murmurs / rubs / gallops. No extremity edema. 2+ pedal pulses. No carotid bruits.  Abdomen: Soft, no tenderness, no masses palpated. No hepatosplenomegaly. Bowel sounds positive.  Musculoskeletal: no clubbing / cyanosis. Good ROM, no contractures. Normal muscle tone.  Skin: no significant rashes, lesions, ulcers on limited dermatological examination. Neurologic: CN 2-12 grossly intact. Sensation intact, DTR normal. Strength 5/5 in all 4.  Psychiatric: Normal judgment and insight. Alert and oriented x 3. Normal mood.    Labs on Admission: I have personally reviewed following labs and imaging studies  CBC: Recent Labs  Lab 02/13/18 1658  WBC 7.8  HGB 9.6*  HCT 32.1*  MCV 99.1  PLT 831   Basic Metabolic Panel: Recent Labs  Lab 02/13/18 1658  NA 141  K 5.7*  CL 113*  CO2 17*  GLUCOSE 118*  BUN 69*  CREATININE 3.78*  CALCIUM 9.1   GFR: Estimated Creatinine Clearance: 11.1 mL/min (A) (by C-G formula based on SCr of 3.78 mg/dL (H)). Liver Function Tests: Recent Labs  Lab 02/13/18 1658  AST 14*  ALT 19    ALKPHOS 125  BILITOT 0.2*  PROT 8.2*  ALBUMIN 4.0   Recent Labs  Lab 02/13/18 1658  LIPASE 36   No results for input(s): AMMONIA in the last 168 hours. Coagulation Profile: No results for input(s): INR, PROTIME in the last 168 hours. Cardiac Enzymes: No results for input(s): CKTOTAL, CKMB, CKMBINDEX, TROPONINI in the last 168 hours. BNP (last 3 results) No results for input(s): PROBNP in the last 8760 hours. HbA1C: No results for input(s): HGBA1C in the last 72 hours. CBG: No results for input(s): GLUCAP in the last 168 hours. Lipid Profile: No results for input(s): CHOL, HDL, LDLCALC, TRIG, CHOLHDL, LDLDIRECT in the last 72 hours. Thyroid Function Tests: No results for input(s): TSH, T4TOTAL, FREET4, T3FREE, THYROIDAB in the last 72 hours. Anemia Panel: No results for input(s): VITAMINB12, FOLATE, FERRITIN, TIBC, IRON, RETICCTPCT in the last 72 hours. Urine analysis:    Component Value Date/Time   COLORURINE STRAW (A) 06/13/2017 2250  APPEARANCEUR CLEAR 06/13/2017 2250   LABSPEC 1.009 06/13/2017 2250   PHURINE 5.0 06/13/2017 2250   GLUCOSEU NEGATIVE 06/13/2017 2250   HGBUR NEGATIVE 06/13/2017 2250   BILIRUBINUR NEGATIVE 06/13/2017 2250   [place] 06/13/2017 2250   PROTEINUR 30 (A) 06/13/2017 2250   NITRITE NEGATIVE 06/13/2017 2250   LEUKOCYTESUR SMALL (A) 06/13/2017 2250    Radiological Exams on Admission: No results found.  EKG: Independently reviewed. Vent. rate 83 BPM PR interval * ms QRS duration 81 ms QT/QTc 390/459 ms P-R-T axes 51 -29 73 Sinus rhythm Borderline left axis deviation Low voltage, precordial leads  Assessment/Plan Principal Problem:   Hyperkalemia Likely due to worsening renal function. Observation/telemetry. The patient denies using potassium supplements. She was given fluids and Kayexalate earlier. I ordered an extra dose of Kayexalate 15 g p.o. x1. Follow-up potassium level.  Active Problems:   Diabetes mellitus  type 2 in obese ([place]) His hemoglobin A1c was 7.5% in June 2018. She is currently not taking medications or using insulin. We will monitor CBG.    Essential hypertension Currently not taking medications. Monitor blood pressure. The patient was given a prescription for amlodipine by her PCP. Declined to start the medication while in the hospital.    Acute renal failure superimposed on stage 4 chronic kidney disease ([place]) She was given 1 L of IV fluids earlier. Given history of renal disease, will defer further fluid infusion at this time.    Anemia in stage 4 chronic kidney disease ([facility]) H&H are around baseline. Monitor hematocrit and hemoglobin.    Gout Not on medication at this time.    CAD (coronary artery disease) Advised to at least resume aspirin.   DVT prophylaxis: Lovenox SQ. Code Status: Full code. Family Communication:  Disposition Plan: Observation for hyperkalemia treatment. Consults called:  Admission status: Observation/telemetry.   Reubin Milan MD [facility] Pager [phone].  If 7PM-7AM, please contact night-coverage [email or web address] Password [facility]  02/13/2018, 11:19 PM

## 2018-02-14 ENCOUNTER — Observation Stay (HOSPITAL_COMMUNITY): Payer: Medicare PPO

## 2018-02-14 ENCOUNTER — Encounter (HOSPITAL_COMMUNITY): Payer: Self-pay | Admitting: *Deleted

## 2018-02-14 ENCOUNTER — Other Ambulatory Visit: Payer: Self-pay

## 2018-02-14 DIAGNOSIS — N179 Acute kidney failure, unspecified: Secondary | ICD-10-CM | POA: Diagnosis not present

## 2018-02-14 DIAGNOSIS — E1169 Type 2 diabetes mellitus with other specified complication: Secondary | ICD-10-CM | POA: Diagnosis not present

## 2018-02-14 DIAGNOSIS — I2510 Atherosclerotic heart disease of native coronary artery without angina pectoris: Secondary | ICD-10-CM | POA: Diagnosis not present

## 2018-02-14 DIAGNOSIS — M25571 Pain in right ankle and joints of right foot: Secondary | ICD-10-CM | POA: Diagnosis not present

## 2018-02-14 DIAGNOSIS — I10 Essential (primary) hypertension: Secondary | ICD-10-CM | POA: Diagnosis not present

## 2018-02-14 DIAGNOSIS — M109 Gout, unspecified: Secondary | ICD-10-CM | POA: Diagnosis not present

## 2018-02-14 DIAGNOSIS — N184 Chronic kidney disease, stage 4 (severe): Secondary | ICD-10-CM | POA: Diagnosis not present

## 2018-02-14 DIAGNOSIS — D631 Anemia in chronic kidney disease: Secondary | ICD-10-CM | POA: Diagnosis not present

## 2018-02-14 DIAGNOSIS — E875 Hyperkalemia: Secondary | ICD-10-CM | POA: Diagnosis not present

## 2018-02-14 DIAGNOSIS — E669 Obesity, unspecified: Secondary | ICD-10-CM | POA: Diagnosis not present

## 2018-02-14 LAB — GLUCOSE, CAPILLARY
GLUCOSE-CAPILLARY: 109 mg/dL — AB (ref 65–99)
Glucose-Capillary: 104 mg/dL — ABNORMAL HIGH (ref 65–99)

## 2018-02-14 LAB — BASIC METABOLIC PANEL
Anion gap: 9 (ref 5–15)
BUN: 69 mg/dL — AB (ref 6–20)
CALCIUM: 8.7 mg/dL — AB (ref 8.9–10.3)
CO2: 17 mmol/L — ABNORMAL LOW (ref 22–32)
Chloride: 115 mmol/L — ABNORMAL HIGH (ref 101–111)
Creatinine, Ser: 3.34 mg/dL — ABNORMAL HIGH (ref 0.44–1.00)
GFR calc Af Amer: 14 mL/min — ABNORMAL LOW (ref >60)
GFR, EST NON AFRICAN AMERICAN: 12 mL/min — AB (ref >60)
GLUCOSE: 116 mg/dL — AB (ref 65–99)
Potassium: 5.3 mmol/L — ABNORMAL HIGH (ref 3.5–5.1)
SODIUM: 141 mmol/L (ref 135–145)

## 2018-02-14 LAB — URIC ACID: URIC ACID, SERUM: 8 mg/dL — AB (ref 2.3–6.6)

## 2018-02-14 LAB — CBC
HCT: 29.5 % — ABNORMAL LOW (ref 36.0–46.0)
Hemoglobin: 9 g/dL — ABNORMAL LOW (ref 12.0–15.0)
MCH: 30 pg (ref 26.0–34.0)
MCHC: 30.5 g/dL (ref 30.0–36.0)
MCV: 98.3 fL (ref 78.0–100.0)
PLATELETS: 262 10*3/uL (ref 150–400)
RBC: 3 MIL/uL — AB (ref 3.87–5.11)
RDW: 13.5 % (ref 11.5–15.5)
WBC: 8 10*3/uL (ref 4.0–10.5)

## 2018-02-14 LAB — CBG MONITORING, ED
Glucose-Capillary: 102 mg/dL — ABNORMAL HIGH (ref 65–99)
Glucose-Capillary: 175 mg/dL — ABNORMAL HIGH (ref 65–99)

## 2018-02-14 MED ORDER — SODIUM POLYSTYRENE SULFONATE 15 GM/60ML PO SUSP
15.0000 g | Freq: Once | ORAL | Status: AC
  Administered 2018-02-14: 15 g via ORAL
  Filled 2018-02-14: qty 60

## 2018-02-14 MED ORDER — NEPRO/CARBSTEADY PO LIQD
237.0000 mL | Freq: Two times a day (BID) | ORAL | Status: DC
  Administered 2018-02-15 (×2): 237 mL via ORAL

## 2018-02-14 MED ORDER — HEPARIN SODIUM (PORCINE) 5000 UNIT/ML IJ SOLN
5000.0000 [IU] | Freq: Three times a day (TID) | INTRAMUSCULAR | Status: DC
  Administered 2018-02-14 – 2018-02-15 (×4): 5000 [IU] via SUBCUTANEOUS
  Filled 2018-02-14 (×4): qty 1

## 2018-02-14 MED ORDER — BISACODYL 10 MG RE SUPP: 10.0000 mg | Freq: Every day | RECTAL | Status: DC | PRN

## 2018-02-14 MED ORDER — SODIUM BICARBONATE 650 MG PO TABS
650.0000 mg | ORAL_TABLET | Freq: Two times a day (BID) | ORAL | Status: DC
  Administered 2018-02-14 – 2018-02-15 (×3): 650 mg via ORAL
  Filled 2018-02-14 (×7): qty 1

## 2018-02-14 NOTE — ED Notes (Signed)
Given lunch tray.

## 2018-02-14 NOTE — Progress Notes (Signed)
PROGRESS NOTE    Kristen Kaufman  IRC:[id number] DOB: October 27, 1931 DOA: 02/13/2018 PCP: Sherrilee Gilles, DO   Brief Narrative:  HPI on 02/13/2018 by Dr. Tennis Must Kristen Kaufman is a 84 y.o. female with medical history significant of chronic back pain, type 2 diabetes, gout, hypertension, CAD/history of MI, PUD, stage IV chronic kidney disease who is being referred by her PCP due to hyperkalemia.  She denies fever, chills, headache, sore throat, dyspnea, cough, wheezing, hemoptysis, chest pain, palpitations, dizziness, lower extremity edema, abdominal pain, nausea, emesis, diarrhea, constipation, melena or hematochezia.  No dysuria or hematuria.  No heat or cold intolerance.  No polyuria, polydipsia or blurred vision.  She states that she has been off medications for over a month now and feels much better.  She does not seem to be planning to resume medications.  She had now follow with her physician until earlier today.  Assessment & Plan   Hyperkalemia -potassium upon admission was 5.7 -patient given Kayexalate, repeat potassium 5.3 -Will repeat dose of Kayexalate and continue to monitor BMP later today -EKG reviewed, appears to be stable  Acute on Chronic kidney disease, stage IV -Creatinine upon admission, 3.78.  Currently 3.34 -Unknown recent baseline -Called patient's nephrologist in [place], [place]. Last creatinine in July 2018 was 3.61.  Also informed that patient did not show up to 2 of her appointments as well as canceled to for appointments.  She has not been seen since July 2018.  Discussed with patient the importance of following up with her nephrologist as well as physicians in [place].  Patient states she does not like them.  Advised patient to find physicians that she can tolerate. -suspect patient is at her baseline -was given IVF -Continue to monitor BMP -Monitor urine output  Diabetes mellitus, type II -Lantus patient's home medications however she states she is not taking  it -Last hemoglobin A1c 7.5 in June 2018  Essential Hypertension -Patient states she is not taking any home medications -Was recently given a prescription for amlodipine by her primary care physician but declined the medication while in the hospital  Anemia of chronic disease -hemoglobin appears stable -continue to monitor CBC  Gout -stable, currently not on medication  CAD -no complaints of chest pain -Currently on no home medications, discussed using aspirin  Right ankle pain -X-ray unremarkable for fracture  DVT Prophylaxis  heparin  Code Status: DNR  Family Communication: None at bedside  Disposition Plan: Observation, likely discharge to home on 02/15/18  Consultants None  Procedures  None  Antibiotics   Anti-infectives (From admission, onward)   None      Subjective:   Kristen Kaufman seen and examined today.  Patient currently has no complaints.  Wonders when she will go home.  Denies current chest pain, shortness breath, abdominal pain, nausea or vomiting, diarrhea constipation, dizziness or headache.  Patient states she does not like following up with her physicians, she did not like her doctors in [place].  Objective:   Vitals:   02/14/18 0700 02/14/18 0800 02/14/18 1000 02/14/18 1100  BP: (!) 137/57 138/61 (!) 138/54 (!) 131/57  Pulse:  80 83 82  Resp: 15 13 20 18   Temp:      TempSrc:      SpO2: 97% 96% 96% 92%  Weight:      Height:        Intake/Output Summary (Last 24 hours) at 02/14/2018 1339 Last data filed at 02/14/2018 1006 Gross per 24 hour  Intake 1000 ml  Output 150 ml  Net 850 ml   Filed Weights   02/13/18 1640  Weight: 80.7 kg (178 lb)    Exam  General: Well developed, well nourished, NAD, appears stated age  HEENT: NCAT, mucous membranes moist.   Neck: Supple  Cardiovascular: S1 S2 auscultated, RRR, no murmur  Respiratory: Clear to auscultation bilaterally with equal chest rise  Abdomen: Soft, nontender,  nondistended, + bowel sounds  Extremities: warm dry without cyanosis clubbing. Trace LE edema  Neuro: AAOx3, nonfocal  Psych: appropriate, however question patient's insight into her medical problems   Data Reviewed: I have personally reviewed following labs and imaging studies  CBC: Recent Labs  Lab 02/13/18 1658 02/14/18 0618  WBC 7.8 8.0  HGB 9.6* 9.0*  HCT 32.1* 29.5*  MCV 99.1 98.3  PLT 275 073   Basic Metabolic Panel: Recent Labs  Lab 02/13/18 1658 02/14/18 0618  NA 141 141  K 5.7* 5.3*  CL 113* 115*  CO2 17* 17*  GLUCOSE 118* 116*  BUN 69* 69*  CREATININE 3.78* 3.34*  CALCIUM 9.1 8.7*   GFR: Estimated Creatinine Clearance: 12.6 mL/min (A) (by C-G formula based on SCr of 3.34 mg/dL (H)). Liver Function Tests: Recent Labs  Lab 02/13/18 1658  AST 14*  ALT 19  ALKPHOS 125  BILITOT 0.2*  PROT 8.2*  ALBUMIN 4.0   Recent Labs  Lab 02/13/18 1658  LIPASE 36   No results for input(s): AMMONIA in the last 168 hours. Coagulation Profile: No results for input(s): INR, PROTIME in the last 168 hours. Cardiac Enzymes: No results for input(s): CKTOTAL, CKMB, CKMBINDEX, TROPONINI in the last 168 hours. BNP (last 3 results) No results for input(s): PROBNP in the last 8760 hours. HbA1C: No results for input(s): HGBA1C in the last 72 hours. CBG: Recent Labs  Lab 02/14/18 0911 02/14/18 1157  GLUCAP 102* 175*   Lipid Profile: No results for input(s): CHOL, HDL, LDLCALC, TRIG, CHOLHDL, LDLDIRECT in the last 72 hours. Thyroid Function Tests: No results for input(s): TSH, T4TOTAL, FREET4, T3FREE, THYROIDAB in the last 72 hours. Anemia Panel: No results for input(s): VITAMINB12, FOLATE, FERRITIN, TIBC, IRON, RETICCTPCT in the last 72 hours. Urine analysis:    Component Value Date/Time   COLORURINE STRAW (A) 06/13/2017 2250   APPEARANCEUR CLEAR 06/13/2017 2250   LABSPEC 1.009 06/13/2017 2250   PHURINE 5.0 06/13/2017 2250   GLUCOSEU NEGATIVE 06/13/2017  2250   HGBUR NEGATIVE 06/13/2017 2250   BILIRUBINUR NEGATIVE 06/13/2017 2250   KETONESUR NEGATIVE 06/13/2017 2250   PROTEINUR 30 (A) 06/13/2017 2250   NITRITE NEGATIVE 06/13/2017 2250   LEUKOCYTESUR SMALL (A) 06/13/2017 2250   Sepsis Labs: @LABRCNTIP (procalcitonin:4,lacticidven:4)  )No results found for this or any previous visit (from the past 240 hour(s)).    Radiology Studies: Dg Ankle 2 Views Left  Result Date: 02/14/2018 CLINICAL DATA:  84 year old female with right ankle pain. EXAM: LEFT ANKLE - 2 VIEW COMPARISON:  None. FINDINGS: There is no acute fracture or dislocation. The bones are osteopenic. No significant arthritic changes. There is a 1 cm plantar calcaneal spur and a 1 cm posterior calcaneal spur at the insertion of the Achilles tendon. The soft tissues are unremarkable. IMPRESSION: No acute fracture or dislocation. Electronically Signed   By: Anner Crete M.D.   On: 02/14/2018 06:50     Scheduled Meds: . enoxaparin (LOVENOX) injection  30 mg Subcutaneous Q24H  . sodium bicarbonate  650 mg Oral BID   Continuous Infusions:   LOS: 0 days  Time Spent in minutes   30 minutes  Kristen Kaufman D.O. on 02/14/2018 at 1:39 PM  Between 7am to 7pm - Pager - [phone]  After 7pm go to [email or web address] - password [email or web address]  And look for the night coverage person covering for me after hours  [facility] Office  [phone]

## 2018-02-14 NOTE — Care Management Obs Status (Signed)
[facility] NOTIFICATION   Patient Details  Name: Kristen Kaufman MRN: [id number] Date of Birth: [birth year]   Medicare Observation Status Notification Given:       Sherald Barge, RN 02/14/2018, 3:37 PM

## 2018-02-15 ENCOUNTER — Observation Stay (HOSPITAL_COMMUNITY): Payer: Medicare PPO

## 2018-02-15 DIAGNOSIS — D631 Anemia in chronic kidney disease: Secondary | ICD-10-CM

## 2018-02-15 DIAGNOSIS — E669 Obesity, unspecified: Secondary | ICD-10-CM

## 2018-02-15 DIAGNOSIS — I2510 Atherosclerotic heart disease of native coronary artery without angina pectoris: Secondary | ICD-10-CM | POA: Diagnosis not present

## 2018-02-15 DIAGNOSIS — R5381 Other malaise: Secondary | ICD-10-CM

## 2018-02-15 DIAGNOSIS — I10 Essential (primary) hypertension: Secondary | ICD-10-CM | POA: Diagnosis not present

## 2018-02-15 DIAGNOSIS — E1169 Type 2 diabetes mellitus with other specified complication: Secondary | ICD-10-CM

## 2018-02-15 DIAGNOSIS — N184 Chronic kidney disease, stage 4 (severe): Secondary | ICD-10-CM | POA: Diagnosis not present

## 2018-02-15 DIAGNOSIS — N179 Acute kidney failure, unspecified: Secondary | ICD-10-CM | POA: Diagnosis not present

## 2018-02-15 DIAGNOSIS — R52 Pain, unspecified: Secondary | ICD-10-CM | POA: Diagnosis not present

## 2018-02-15 DIAGNOSIS — M25571 Pain in right ankle and joints of right foot: Secondary | ICD-10-CM

## 2018-02-15 DIAGNOSIS — E875 Hyperkalemia: Secondary | ICD-10-CM | POA: Diagnosis not present

## 2018-02-15 DIAGNOSIS — M2540 Effusion, unspecified joint: Secondary | ICD-10-CM

## 2018-02-15 DIAGNOSIS — M109 Gout, unspecified: Secondary | ICD-10-CM | POA: Diagnosis not present

## 2018-02-15 DIAGNOSIS — R262 Difficulty in walking, not elsewhere classified: Secondary | ICD-10-CM

## 2018-02-15 LAB — GLUCOSE, CAPILLARY
Glucose-Capillary: 98 mg/dL (ref 65–99)
Glucose-Capillary: 151 mg/dL — ABNORMAL HIGH (ref 65–99)

## 2018-02-15 LAB — BASIC METABOLIC PANEL
ANION GAP: 11 (ref 5–15)
BUN: 63 mg/dL — ABNORMAL HIGH (ref 6–20)
CALCIUM: 8.7 mg/dL — AB (ref 8.9–10.3)
CO2: 17 mmol/L — ABNORMAL LOW (ref 22–32)
Chloride: 110 mmol/L (ref 101–111)
Creatinine, Ser: 2.9 mg/dL — ABNORMAL HIGH (ref 0.44–1.00)
GFR calc Af Amer: 16 mL/min — ABNORMAL LOW (ref >60)
GFR, EST NON AFRICAN AMERICAN: 14 mL/min — AB (ref >60)
GLUCOSE: 107 mg/dL — AB (ref 65–99)
Potassium: 4.5 mmol/L (ref 3.5–5.1)
SODIUM: 138 mmol/L (ref 135–145)

## 2018-02-15 MED ORDER — COLCHICINE 0.6 MG PO TABS: 0.6000 mg | ORAL_TABLET | Freq: Every day | ORAL | 0 refills | Status: DC

## 2018-02-15 MED ORDER — COLCHICINE 0.6 MG PO TABS
0.6000 mg | ORAL_TABLET | Freq: Every day | ORAL | Status: DC
  Administered 2018-02-15: 0.6 mg via ORAL
  Filled 2018-02-15: qty 1

## 2018-02-15 NOTE — Progress Notes (Signed)
Nutrition Brief Note  Patient identified on the Malnutrition Screening Tool (MST) Report  Patient seen with all her belongings gathered, stating she will be leaving in next couple hours.   She says her appetite is good right now and actually felt she did not receive enough food at her meals. She has been consuming 100% of meals.   No nutrition interventions warranted at this time. If nutrition issues arise, please consult RD.   Burtis Junes RD, LDN, CNSC Clinical Nutrition Pager: [phone] 02/15/2018 2:24 PM

## 2018-02-15 NOTE — Progress Notes (Signed)
Per the nurse who discharged patient, the IV was dc'd.

## 2018-02-15 NOTE — Evaluation (Signed)
Physical Therapy Evaluation Patient Details Name: Kristen Kaufman MRN: [id number] DOB: [birth year] Today's Date: 02/15/2018   History of Present Illness  Kristen Kaufman is an 84yo black female who comes to [facility] on 2/26 from her PCP d/t hyperK+. PMH chronic back pain, type 2 diabetes, gout, hypertension, CAD/history of MI, PUD, stage IV chronic kidney disease. While admitted 17MO, PT evaluation noted AMB limited to 62ft, which at that time pt attested as baseline.   Clinical Impression  Pt admitted with above diagnosis. Pt currently with functional limitations due to the deficits listed below (see "PT Problem List"). Upon entry, the patient is received seated EOB, female companion in room. The pt is awake and agreeable to participate. Pt c/o 10/10 Left knee pain from 3-4 inches proximal the joint to 3-4 inches distance the joint. She reports she was able to AMB to BR with nursing a couple hours ago, and has since received tylenol, but now the pain is worse. Pt reports she was having knee pain upon arrival to the hospital, but it "comes and goes".  Functional mobility assessment demonstrates maximal effort required to come STS with RW, and inability to AMB and bear weight on LLE d/t pain. Pt will benefit from skilled PT intervention to increase independence and safety with basic mobility in preparation for discharge to the venue listed below.    Patient suffers from chronic leg weakness, pain, and instability which impairs their ability to perform daily activities like performing bathing, toiletting, and walking in the home.  A walker alone will not resolve the issues with performing activities of daily living. A wheelchair will allow patient to safely perform daily activities.  The patient can self propel in the home or has a caregiver who can provide assistance.         Follow Up Recommendations SNF    Equipment Recommendations  Wheelchair (measurements PT);Wheelchair cushion (measurements PT)(anti tippers;  elevated foot rests)    Recommendations for Other Services       Precautions / Restrictions Precautions Precautions: Fall Restrictions Weight Bearing Restrictions: No      Mobility  Bed Mobility               General bed mobility comments: received seated at EOB   Transfers Overall transfer level: Needs assistance Equipment used: Rolling walker (2 wheeled) Transfers: Sit to/from Stand Sit to Stand: Min guard         General transfer comment: rocks forward mul;tiple times prior to rising, requires maximal effort to stand.   Ambulation/Gait Ambulation/Gait assistance: (attempts but is unabelto tolerate weight bearing on the LLE d/t knee pain)              Stairs            Wheelchair Mobility    Modified Rankin (Stroke Patients Only)       Balance                                             Pertinent Vitals/Pain Pain Assessment: 0-10 Pain Score: 10-Worst pain ever Pain Location: Left knee pain  Pain Descriptors / Indicators: Aching Pain Intervention(s): Limited activity within patient's tolerance;Premedicated before session;Monitored during session    [place] expects to be discharged to:: Private residence Living Arrangements: Spouse/significant other(female companion) Available Help at Discharge: Available 24 hours/day Type of Home: House Home Access: Stairs to  enter   Entrance Stairs-Number of Steps: 2 Home Layout: One level Home Equipment: Cane - single point;Walker - 4 wheels      Prior Function Level of Independence: Independent with assistive device(s);Needs assistance   Gait / Transfers Assistance Needed: mostly household distance AMB with SPC. goes intocommunity as needed, short distances only.   ADL's / Homemaking Assistance Needed: Pt required assistance for bathing and dressing.        Hand Dominance   Dominant Hand: Right    Extremity/Trunk Assessment   Upper Extremity  Assessment Upper Extremity Assessment: Generalized weakness    Lower Extremity Assessment Lower Extremity Assessment: Generalized weakness       Communication   Communication: No difficulties  Cognition Arousal/Alertness: Awake/alert Behavior During Therapy: WFL for tasks assessed/performed Overall Cognitive Status: Within Functional Limits for tasks assessed                                        General Comments      Exercises     Assessment/Plan    PT Assessment Patient needs continued PT services  PT Problem List Decreased strength;Decreased activity tolerance;Decreased balance;Decreased mobility;Decreased safety awareness       PT Treatment Interventions DME instruction;Therapeutic activities;Functional mobility training;Therapeutic exercise    PT Goals (Current goals can be found in the Care Plan section)  Acute Rehab PT Goals Patient Stated Goal: reduce left knee pain PT Goal Formulation: With patient Time For Goal Achievement: 03/01/18 Potential to Achieve Goals: Fair    Frequency Min 3X/week   Barriers to discharge Inaccessible home environment      Co-evaluation               AM-PAC PT "6 Clicks" Daily Activity  Outcome Measure Difficulty turning over in bed (including adjusting bedclothes, sheets and blankets)?: A Lot Difficulty moving from lying on back to sitting on the side of the bed? : A Lot Difficulty sitting down on and standing up from a chair with arms (e.g., wheelchair, bedside commode, etc,.)?: A Lot Help needed moving to and from a bed to chair (including a wheelchair)?: A Lot Help needed walking in hospital room?: Total Help needed climbing 3-5 steps with a railing? : Total 6 Click Score: 10    End of Session Equipment Utilized During Treatment: Gait belt Activity Tolerance: Patient limited by pain Patient left: in bed;with nursing/sitter in room;with call bell/phone within reach Nurse Communication: Mobility  status(pain unable to mobilize d/t pain) PT Visit Diagnosis: Muscle weakness (generalized) (M62.81);Difficulty in walking, not elsewhere classified (R26.2);Pain Pain - Right/Left: Left Pain - part of body: Knee    Time: 3810-1751 PT Time Calculation (min) (ACUTE ONLY): 9 min   Charges:   PT Evaluation $PT Eval Low Complexity: 1 Low     PT G Codes:        11:46 AM, Mar 01, 2018 Etta Grandchild, PT, DPT Physical Therapist at [facility] [phone] (office)     Karlei Waldo C Mar 01, 2018, 11:41 AM

## 2018-02-15 NOTE — Discharge Summary (Signed)
Discharge Summary  Kristen Kaufman XBD:[id number] DOB: 06-Jan-1931  PCP: Sherrilee Gilles, DO  Admit date: 02/13/2018 Discharge date: 02/15/2018  Time spent: 25 minutes  Recommendations for Outpatient Follow-up:  1. Follow up with your PCP within a week 2. Repeat BMP in 48 hours to monitor renal function 3. Patient requests and is adamant about being discharged today 02/15/18 states she will follow up with her PCP in [place] as soon as possible.  Discharge Diagnoses:  Active Hospital Problems   Diagnosis Date Noted  . Hyperkalemia 05/25/2017  . Gout 02/13/2018  . CAD (coronary artery disease) 02/13/2018  . Acute renal failure superimposed on stage 4 chronic kidney disease ([place])   . Anemia in stage 4 chronic kidney disease ([place] [facility])   . Diabetes mellitus type 2 in obese ([place]) 05/24/2017  . Essential hypertension 05/24/2017    Resolved Hospital Problems  No resolved problems to display.    Discharge Condition: stable  Diet recommendation: resume previous diet  Vitals:   02/14/18 2138 02/15/18 0513  BP: (!) 179/95 (!) 144/58  Pulse: 85 77  Resp: 20 20  Temp: 98.5 F (36.9 C) 98.3 F (36.8 C)  SpO2: 99% 100%    History of present illness:  Kristen Kaufman a 84 y.o.femalewith medical history significant ofchronic back pain, type 2 diabetes, gout, hypertension, CAD/history of MI, PUD, stage IV chronic kidney disease who is being referred by her PCP due to hyperkalemia. Admitted for hyperkalemia and treated with po kayexalate with improvement. EKG 02/14/18 mild T wave elevation.  02/15/18: Seen and examined with her significant other at her bedside. Denies chest pain. Reports left knee pain. Left knee xray done and started on colchicine for suspected gout flare. PT evaluated and recommended PT for short term rehab. Uses a walker at home.  Patient declines SNF placement. She requests and is adamant about being discharged to home today 02/15/18.  She will be discharged with home  health PT. Advised on fall precaution.   Hospital Course:  Principal Problem:   Hyperkalemia Active Problems:   Diabetes mellitus type 2 in obese [facility])   Essential hypertension   Acute renal failure superimposed on stage 4 chronic kidney disease (HCC)   Anemia in stage 4 chronic kidney disease (HCC)   Gout   CAD (coronary artery disease) Hyperkalemia, most likely multifactorial 2/2 to advanced renal failure vs others -K+ 5.7 on admission with peaked T waves -received multiple doses of po kayexalate -K+ today 4.5-no chest pain -continue to monitor urine output and K+ level  AKI on CKD IV -improving -cr 2.90 from 3.34 -baseline cr 2.7 -avoid nephrotoxic agents/hypotension/dehydration -continue to monitor urine output and renal function  Suspected acute gout flare of left knee -mild effusion noted on physical exam -left knee xray ordered -started on colchicine -closely monitor creatinine -monitor symptoms -hx of gout-not on gout medications outpatient  Metabolic acidosis -Most likely 2/2 to advanced renal failure -IV fluid hydration -BMP am  Chronic normocytic anemia suspect anemia of chronic disease 2/2 CKD IV -hg stable 9.0 -Baseline hg 10 -no overt bleeding -continue monitor   Physical debility/ambulatory dysfunction -uses walker at home -PT evaluated and recommended SNF for short term rehab -case worker consulted for SNF placement -continue PT -Fall precaution   Code Status: DNR  Family Communication: significant other at bedside   Disposition Plan: SNF if agreeable. Possibly tomorrow if no events overnight.   Consultants:  Case worker for SNF placement  Procedures:  none  Antimicrobials:  none   DVT  prophylaxis:  SCDs; sq heparin 5000 u tid   Procedures:  none  Discharge Exam: BP (!) 144/58 (BP Location: Right Arm)   Pulse 77   Temp 98.3 F (36.8 C) (Oral)   Resp 20   Ht 5\' 1"  (1.549 m)   Wt 78 kg (171 lb 15.3  oz)   SpO2 100%   BMI 32.49 kg/m    General:  84 yo AAF WD WN NAD A&O x 3   Cardiovascular: RRR no rubs or gallops   Respiratory: CTA no wheezes or rales  Abdomen: soft obese NT ND NBS x4  Musculoskeletal: no focal abnormalities; left mild effusion of left knee.  Skin: no rash  Psychiatry: mood is appropriate for condition and setting   Discharge Instructions You were cared for by a hospitalist during your hospital stay. If you have any questions about your discharge medications or the care you received while you were in the hospital after you are discharged, you can call the unit and asked to speak with the hospitalist on call if the hospitalist that took care of you is not available. Once you are discharged, your primary care physician will handle any further medical issues. Please note that NO REFILLS for any discharge medications will be authorized once you are discharged, as it is imperative that you return to your primary care physician (or establish a relationship with a primary care physician if you do not have one) for your aftercare needs so that they can reassess your need for medications and monitor your lab values.   Allergies as of 02/15/2018      Reactions   Influenza Vaccines Anaphylaxis   Menactra [meningococcal A C Y&w-135 Conj]       Medication List    STOP taking these medications   torsemide 20 MG tablet Commonly known as:  DEMADEX     TAKE these medications   acetaminophen 500 MG tablet Commonly known as:  TYLENOL Take 500 mg by mouth every 6 (six) hours as needed for mild pain or moderate pain.   amLODipine 10 MG tablet Commonly known as:  NORVASC Take 10 mg by mouth daily.   bisacodyl 10 MG suppository Commonly known as:  DULCOLAX Place 1 suppository (10 mg total) rectally daily as needed for mild constipation, moderate constipation or severe constipation.   colchicine 0.6 MG tablet Take 1 tablet (0.6 mg total) by mouth daily. Start taking  on:  02/16/2018   febuxostat 40 MG tablet Commonly known as:  ULORIC Take 1 tablet (40 mg total) by mouth daily.   insulin glargine 100 UNIT/ML injection Commonly known as:  LANTUS Inject 0.3 mLs (30 Units total) into the skin at bedtime.   methocarbamol 500 MG tablet Commonly known as:  ROBAXIN Take 500 mg by mouth 2 (two) times daily as needed for muscle spasms.   polyethylene glycol powder powder Commonly known as:  GLYCOLAX/MIRALAX Take 17 g by mouth daily.   senna-docusate 8.6-50 MG tablet Commonly known as:  Senokot-S Take 1 tablet by mouth 2 (two) times daily.   sodium bicarbonate 650 MG tablet Take 1 tablet (650 mg total) by mouth 2 (two) times daily.      Allergies  Allergen Reactions  . Influenza Vaccines Anaphylaxis  . Menactra [Meningococcal A C Y&W-135 Conj]    Follow-up Information    Sherrilee Gilles, DO Follow up.   Specialty:  Family Medicine Contact information: [address] [place] [address] [phone]  The results of significant diagnostics from this hospitalization (including imaging, microbiology, ancillary and laboratory) are listed below for reference.    Significant Diagnostic Studies: Dg Ankle 2 Views Left  Result Date: 02/14/2018 CLINICAL DATA:  84 year old female with right ankle pain. EXAM: LEFT ANKLE - 2 VIEW COMPARISON:  None. FINDINGS: There is no acute fracture or dislocation. The bones are osteopenic. No significant arthritic changes. There is a 1 cm plantar calcaneal spur and a 1 cm posterior calcaneal spur at the insertion of the Achilles tendon. The soft tissues are unremarkable. IMPRESSION: No acute fracture or dislocation. Electronically Signed   By: Anner Crete M.D.   On: 02/14/2018 06:50   Dg Knee Left Port  Result Date: 02/15/2018 CLINICAL DATA:  Left knee pain especially laterally. History of effusion. No report of injury. EXAM: PORTABLE LEFT KNEE - 1-2 VIEW COMPARISON:  None in PACs FINDINGS: The bones are  subjectively adequately mineralized. There is moderate narrowing of the medial joint compartment. The lateral joint space is preserved but there is calcinosis of the meniscus. Spurs arise from the articular margins of the tibial plateau and the medial femoral condyle and from the patella. There is a large suprapatellar effusion. There is no acute fracture or dislocation. IMPRESSION: Moderate osteoarthritic change centered on the medial and patellofemoral compartments with milder changes laterally. No acute fracture. Large suprapatellar effusion. Electronically Signed   By: David  Martinique M.D.   On: 02/15/2018 13:03    Microbiology: No results found for this or any previous visit (from the past 240 hour(s)).   Labs: Basic Metabolic Panel: Recent Labs  Lab 02/13/18 1658 02/14/18 0618 02/15/18 0648  NA 141 141 138  K 5.7* 5.3* 4.5  CL 113* 115* 110  CO2 17* 17* 17*  GLUCOSE 118* 116* 107*  BUN 69* 69* 63*  CREATININE 3.78* 3.34* 2.90*  CALCIUM 9.1 8.7* 8.7*   Liver Function Tests: Recent Labs  Lab 02/13/18 1658  AST 14*  ALT 19  ALKPHOS 125  BILITOT 0.2*  PROT 8.2*  ALBUMIN 4.0   Recent Labs  Lab 02/13/18 1658  LIPASE 36   No results for input(s): AMMONIA in the last 168 hours. CBC: Recent Labs  Lab 02/13/18 1658 02/14/18 0618  WBC 7.8 8.0  HGB 9.6* 9.0*  HCT 32.1* 29.5*  MCV 99.1 98.3  PLT 275 262   Cardiac Enzymes: No results for input(s): CKTOTAL, CKMB, CKMBINDEX, TROPONINI in the last 168 hours. BNP: BNP (last 3 results) No results for input(s): BNP in the last 8760 hours.  ProBNP (last 3 results) No results for input(s): PROBNP in the last 8760 hours.  CBG: Recent Labs  Lab 02/14/18 1157 02/14/18 1623 02/14/18 2135 02/15/18 0746 02/15/18 1127  GLUCAP 175* 109* 104* 98 151*       Signed:  Kayleen Memos, MD [facility] 02/15/2018, 2:24 PM

## 2018-02-15 NOTE — Discharge Instructions (Signed)
Hyperkalemia °Hyperkalemia is when you have too much potassium in your blood. Potassium is normally removed (excreted) from your body by your kidneys. If there is too much potassium in your blood, it can affect how your heart works. °Follow these instructions at home: °· Take medicines only as told by your doctor. °· Do not take any supplements, natural products, herbs, or vitamins unless your doctor says it is okay. °· Limit your alcohol intake as told by your doctor. °· Stop illegal drug use. If you need help quitting, ask your doctor. °· Keep all follow-up visits as told by your doctor. This is important. °· If you have kidney disease, you may need to follow a low potassium diet. A food specialist (dietitian) can help you. °Contact a doctor if: °· Your heartbeat is not regular or very slow. °· You feel dizzy (light-headed). °· You feel weak. °· You feel sick to your stomach (nauseous). °· You have tingling in your hands or feet. °· You cannot feel your hands or feet. °Get help right away if: °· You are short of breath. °· You have chest pain. °· You pass out (faint). °· You cannot move your muscles. °This information is not intended to replace advice given to you by your health care provider. Make sure you discuss any questions you have with your health care provider. °Document Released: 12/05/2005 Document Revised: 05/12/2016 Document Reviewed: 03/12/2014 °Elsevier Interactive Patient Education © 2018 Elsevier Inc. ° °

## 2018-02-15 NOTE — Care Management Note (Signed)
Case Management Note  Patient Details  Name: Kristen Kaufman MRN: [id number] Date of Birth: 13-Jan-1931        Admitted with hyperkalemia. From home, has aid 5 days a week. Pt has says she is active with [facility] however when I called [facility] about resuming services, they are not active with patient currently. PT has recommended SNF, pt refuses SNF okay with HH PT. PT recommends WC, pt already has WC at home. CM has contacted mandy, [facility] rep., referral sent, they will be able to see pt tomorrow at home. Pt aware.                Expected Discharge Date:  02/15/18               Expected Discharge Plan:  [place]  In-House Referral:  NA  Discharge planning Services  CM Consult  Post Acute Care Choice:  Home Health, Resumption of Svcs/PTA Provider Choice offered to:  Patient  HH Arranged:  PT [place] Agency:  [facility]  Status of Service:  Completed, signed off  If discussed at [place] of Stay Meetings, dates discussed:    Additional Comments:  Sherald Barge, RN 02/15/2018, 2:44 PM

## 2018-02-15 NOTE — Progress Notes (Signed)
Kristen Kaufman discharged Home per MD order.  Discharge instructions reviewed and discussed with the patient, all questions and concerns answered. Copy of instructions and scripts given to patient.  Allergies as of 02/15/2018      Reactions   Influenza Vaccines Anaphylaxis   Menactra [meningococcal A C Y&w-135 Conj]       Medication List    STOP taking these medications   torsemide 20 MG tablet Commonly known as:  DEMADEX     TAKE these medications   acetaminophen 500 MG tablet Commonly known as:  TYLENOL Take 500 mg by mouth every 6 (six) hours as needed for mild pain or moderate pain.   amLODipine 10 MG tablet Commonly known as:  NORVASC Take 10 mg by mouth daily.   bisacodyl 10 MG suppository Commonly known as:  DULCOLAX Place 1 suppository (10 mg total) rectally daily as needed for mild constipation, moderate constipation or severe constipation.   colchicine 0.6 MG tablet Take 1 tablet (0.6 mg total) by mouth daily. Start taking on:  02/16/2018   febuxostat 40 MG tablet Commonly known as:  ULORIC Take 1 tablet (40 mg total) by mouth daily.   insulin glargine 100 UNIT/ML injection Commonly known as:  LANTUS Inject 0.3 mLs (30 Units total) into the skin at bedtime.   methocarbamol 500 MG tablet Commonly known as:  ROBAXIN Take 500 mg by mouth 2 (two) times daily as needed for muscle spasms.   polyethylene glycol powder powder Commonly known as:  GLYCOLAX/MIRALAX Take 17 g by mouth daily.   senna-docusate 8.6-50 MG tablet Commonly known as:  Senokot-S Take 1 tablet by mouth 2 (two) times daily.   sodium bicarbonate 650 MG tablet Take 1 tablet (650 mg total) by mouth 2 (two) times daily.       Patients skin is clean, dry and intact, no evidence of skin break down. IV site discontinued and catheter remains intact. Site without signs and symptoms of complications. Dressing and pressure applied.  Patient escorted to car by NT in a wheelchair,  no distress noted  upon discharge.  Kristen Kaufman 02/15/2018 7:42 PM

## 2018-02-15 NOTE — Progress Notes (Signed)
PROGRESS NOTE  Kristen Kaufman KGY:[id number] DOB: 1931-04-06 DOA: 02/13/2018 PCP: Sherrilee Gilles, DO  HPI/Recap of past 24 hours: Kristen Kaufman a 84 y.o.femalewith medical history significant ofchronic back pain, type 2 diabetes, gout, hypertension, CAD/history of MI, PUD, stage IV chronic kidney disease who is being referred by her PCP due to hyperkalemia. Admitted for hyperkalemia and treated with po kayexalate with improvement. EKG 02/14/18 mild T wave elevation.  02/15/18: Seen and examined with her significant other at her bedside. Denies chest pain. Reports left knee pain. Left knee xray done and started on colchicine for suspected gout flare. PT evaluated and recommended PT for short term rehab. Uses a walker at home.   Assessment/Plan: Principal Problem:   Hyperkalemia Active Problems:   Diabetes mellitus type 2 in obese [facility])   Essential hypertension   Acute renal failure superimposed on stage 4 chronic kidney disease (HCC)   Anemia in stage 4 chronic kidney disease (HCC)   Gout   CAD (coronary artery disease)  Hyperkalemia, most likely multifactorial 2/2 to advanced renal failure vs others -K+ 5.7 on admission with peaked T waves -received multiple doses of po kayexalate -K+ today 4.5-no chest pain -continue to monitor urine output and K+ level  AKI on CKD IV -improving -cr 2.90 from 3.34 -baseline cr 2.7 -avoid nephrotoxic agents/hypotension/dehydration -continue to monitor urine output and renal function  Suspected acute gout flare of left knee -mild effusion noted on physical exam -left knee xray ordered -started on colchicine -closely monitor creatinine -monitor symptoms -hx of gout-not on gout medications outpatient  Metabolic acidosis -Most likely 2/2 to advanced renal failure -IV fluid hydration -BMP am  Chronic normocytic anemia suspect anemia of chronic disease 2/2 CKD IV -hg stable 9.0 -Baseline hg 10 -no overt bleeding -continue monitor    Physical debility/ambulatory dysfunction -uses walker at home -PT evaluated and recommended SNF for short term rehab -case worker consulted for SNF placement -continue PT -Fall precaution   Code Status: DNR  Family Communication: significant other at bedside   Disposition Plan: SNF if agreeable. Possibly tomorrow if no events overnight.   Consultants:  Case worker for SNF placement  Procedures:  none  Antimicrobials:  none  DVT prophylaxis:  SCDs; sq heparin 5000 u tid   Objective: Vitals:   02/14/18 1300 02/14/18 1612 02/14/18 2138 02/15/18 0513  BP: (!) 155/71 (!) 173/79 (!) 179/95 (!) 144/58  Pulse: 78 84 85 77  Resp:  20 20 20   Temp:  98.2 F (36.8 C) 98.5 F (36.9 C) 98.3 F (36.8 C)  TempSrc:  Oral Oral Oral  SpO2:  98% 99% 100%  Weight:  78 kg (171 lb 15.3 oz)    Height:  5\' 1"  (1.549 m)      Intake/Output Summary (Last 24 hours) at 02/15/2018 1319 Last data filed at 02/15/2018 1104 Gross per 24 hour  Intake 720 ml  Output 500 ml  Net 220 ml   Filed Weights   02/13/18 1640 02/14/18 1612  Weight: 80.7 kg (178 lb) 78 kg (171 lb 15.3 oz)    Exam:   General:  84 yo AAF WD WN NAD A&O x 3   Cardiovascular: RRR no rubs or gallops   Respiratory: CTA no wheezes or rales  Abdomen: soft obese NT ND NBS x4  Musculoskeletal: no focal abnormalities; left mild effusion of left knee.  Skin: no rash  Psychiatry: mood is appropriate for condition and setting    Data Reviewed: CBC: Recent Labs  Lab  02/13/18 1658 02/14/18 0618  WBC 7.8 8.0  HGB 9.6* 9.0*  HCT 32.1* 29.5*  MCV 99.1 98.3  PLT 275 160   Basic Metabolic Panel: Recent Labs  Lab 02/13/18 1658 02/14/18 0618 02/15/18 0648  NA 141 141 138  K 5.7* 5.3* 4.5  CL 113* 115* 110  CO2 17* 17* 17*  GLUCOSE 118* 116* 107*  BUN 69* 69* 63*  CREATININE 3.78* 3.34* 2.90*  CALCIUM 9.1 8.7* 8.7*   GFR: Estimated Creatinine Clearance: 13.7 mL/min (A) (by C-G formula based on SCr  of 2.9 mg/dL (H)). Liver Function Tests: Recent Labs  Lab 02/13/18 1658  AST 14*  ALT 19  ALKPHOS 125  BILITOT 0.2*  PROT 8.2*  ALBUMIN 4.0   Recent Labs  Lab 02/13/18 1658  LIPASE 36   No results for input(s): AMMONIA in the last 168 hours. Coagulation Profile: No results for input(s): INR, PROTIME in the last 168 hours. Cardiac Enzymes: No results for input(s): CKTOTAL, CKMB, CKMBINDEX, TROPONINI in the last 168 hours. BNP (last 3 results) No results for input(s): PROBNP in the last 8760 hours. HbA1C: No results for input(s): HGBA1C in the last 72 hours. CBG: Recent Labs  Lab 02/14/18 1157 02/14/18 1623 02/14/18 2135 02/15/18 0746 02/15/18 1127  GLUCAP 175* 109* 104* 98 151*   Lipid Profile: No results for input(s): CHOL, HDL, LDLCALC, TRIG, CHOLHDL, LDLDIRECT in the last 72 hours. Thyroid Function Tests: No results for input(s): TSH, T4TOTAL, FREET4, T3FREE, THYROIDAB in the last 72 hours. Anemia Panel: No results for input(s): VITAMINB12, FOLATE, FERRITIN, TIBC, IRON, RETICCTPCT in the last 72 hours. Urine analysis:    Component Value Date/Time   COLORURINE STRAW (A) 06/13/2017 2250   APPEARANCEUR CLEAR 06/13/2017 2250   LABSPEC 1.009 06/13/2017 2250   PHURINE 5.0 06/13/2017 2250   GLUCOSEU NEGATIVE 06/13/2017 2250   HGBUR NEGATIVE 06/13/2017 2250   BILIRUBINUR NEGATIVE 06/13/2017 2250   KETONESUR NEGATIVE 06/13/2017 2250   PROTEINUR 30 (A) 06/13/2017 2250   NITRITE NEGATIVE 06/13/2017 2250   LEUKOCYTESUR SMALL (A) 06/13/2017 2250   Sepsis Labs: @LABRCNTIP (procalcitonin:4,lacticidven:4)  )No results found for this or any previous visit (from the past 240 hour(s)).    Studies: Dg Knee Left Port  Result Date: 02/15/2018 CLINICAL DATA:  Left knee pain especially laterally. History of effusion. No report of injury. EXAM: PORTABLE LEFT KNEE - 1-2 VIEW COMPARISON:  None in PACs FINDINGS: The bones are subjectively adequately mineralized. There is  moderate narrowing of the medial joint compartment. The lateral joint space is preserved but there is calcinosis of the meniscus. Spurs arise from the articular margins of the tibial plateau and the medial femoral condyle and from the patella. There is a large suprapatellar effusion. There is no acute fracture or dislocation. IMPRESSION: Moderate osteoarthritic change centered on the medial and patellofemoral compartments with milder changes laterally. No acute fracture. Large suprapatellar effusion. Electronically Signed   By: David  Martinique M.D.   On: 02/15/2018 13:03    Scheduled Meds: . colchicine  0.6 mg Oral Daily  . feeding supplement (NEPRO CARB STEADY)  237 mL Oral BID BM  . heparin injection (subcutaneous)  5,000 Units Subcutaneous Q8H  . sodium bicarbonate  650 mg Oral BID    Continuous Infusions:   LOS: 0 days     Kayleen Memos, MD [facility] Pager [phone]  If 7PM-7AM, please contact night-coverage [email or web address] Password [email or web address] 02/15/2018, 1:19 PM

## 2018-02-16 NOTE — Progress Notes (Signed)
Patient Information   Patient Name Kristen Kaufman, Kristen Kaufman ([id number]) Sex Female DOB Dec 21, 1930  Room Bed  A324 A324-01  Patient Demographics   Address [place] [address] Phone [phone] (Home)  Patient Ethnicity & Race   Ethnic Group Patient Race  Not Hispanic or Latino Black or African American  Emergency Contact(s)   Name Relation Home Work [place] [phone]    [place], Shingler [phone]    Documents on File    Status Date Received Description  Documents for the Patient  [place] Not Received    [place] E-Signature HIPAA Notice of Privacy Signed 75/30/05   Driver's License Received 10/20/10 Drivers License  Insurance Card Received 17/35/67 Drivers License  Advance Directives/Living Will/HCPOA/POA Not Received    Other Photo ID Not Received    Insurance Card Received 10/05/16 UHC card  HIM ROI Authorization  06/06/17 [facility]  HIM ROI Authorization  06/12/17 [facility]  HIM ROI Authorization  06/13/17 [facility]  HIM ROI Authorization  07/19/17 EM PA  Patient Photo   Photo of Patient  Documents for the Encounter  AOB (Assignment of Insurance Benefits) Not Received    E-signature AOB Signed 02/13/18   MEDICARE RIGHTS Not Received    E-signature Medicare Rights Signed 02/13/18   Cardiac Monitoring Strip Shift Summary Received 02/14/18   ED Patient Billing Extract   ED PB Billing Extract  Medicare Observation  02/14/18   Cardiac Monitoring Strip Received 02/14/18   After Visit Summary   IP After Visit Summary  EKG Received 02/14/18   Admission Information   Attending Provider Admitting Provider Admission Type Admission Date/Time   Reubin Milan, MD Emergency 02/13/18 2047  Discharge Date Hospital Service Auth/Cert Status Service Area  02/15/18 Internal Medicine Incomplete [place]  Unit Room/Bed Admission Status   AP-DEPT 300  A324/A324-01 Discharged (Confirmed)   Admission   Complaint  kidney problem  Hospital Account   Name Acct ID Class Status Primary Coverage  Kristen Kaufman, Kristen Kaufman [id number] Observation Discharged/Not Billed HUMANA MEDICARE - HUMANA MEDICARE CHOICE PPO      Guarantor Account (for [place] [id number])   Name Relation to [place]? Acct Type  Kristen Kaufman Self CHSA Yes Personal/Family  Address Phone    [address] [place] [address] [phone])        Coverage Information (for Hospital Account [id number])   1. HUMANA [place] MEDICARE CHOICE PPO   F/O Payor/Plan Precert #  [facility] MEDICARE CHOICE PPO   Subscriber Subscriber #  Kristen Kaufman, Kristen Kaufman [id number]  Address Phone  [address] [place] [address] [phone]  2. [place]ICA[place] OUT OF STATE/[place]   F/[place] Payor/Plan Precert #  [place]ICA[place] OUT OF [place]   Subscriber Subscriber #  Kristen Kaufman, Kristen Kaufman [id number]  Address Phone  [place] [address] [phone]

## 2018-06-11 ENCOUNTER — Other Ambulatory Visit: Payer: Self-pay

## 2018-06-11 ENCOUNTER — Observation Stay (HOSPITAL_COMMUNITY)
Admission: EM | Admit: 2018-06-11 | Discharge: 2018-06-12 | Disposition: A | Discharge status: Home / Self Care | Payer: 59 | Attending: Family Medicine | Admitting: Family Medicine

## 2018-06-11 ENCOUNTER — Emergency Department (HOSPITAL_COMMUNITY): Payer: 59

## 2018-06-11 ENCOUNTER — Encounter (HOSPITAL_COMMUNITY): Payer: Self-pay | Admitting: Emergency Medicine

## 2018-06-11 DIAGNOSIS — R0789 Other chest pain: Secondary | ICD-10-CM | POA: Diagnosis present

## 2018-06-11 DIAGNOSIS — I129 Hypertensive chronic kidney disease with stage 1 through stage 4 chronic kidney disease, or unspecified chronic kidney disease: Secondary | ICD-10-CM | POA: Insufficient documentation

## 2018-06-11 DIAGNOSIS — D631 Anemia in chronic kidney disease: Secondary | ICD-10-CM | POA: Diagnosis not present

## 2018-06-11 DIAGNOSIS — Z87891 Personal history of nicotine dependence: Secondary | ICD-10-CM | POA: Diagnosis not present

## 2018-06-11 DIAGNOSIS — M109 Gout, unspecified: Secondary | ICD-10-CM | POA: Diagnosis present

## 2018-06-11 DIAGNOSIS — E119 Type 2 diabetes mellitus without complications: Secondary | ICD-10-CM | POA: Diagnosis present

## 2018-06-11 DIAGNOSIS — E875 Hyperkalemia: Secondary | ICD-10-CM | POA: Diagnosis present

## 2018-06-11 DIAGNOSIS — N184 Chronic kidney disease, stage 4 (severe): Secondary | ICD-10-CM | POA: Insufficient documentation

## 2018-06-11 DIAGNOSIS — Z794 Long term (current) use of insulin: Secondary | ICD-10-CM | POA: Insufficient documentation

## 2018-06-11 DIAGNOSIS — Z79899 Other long term (current) drug therapy: Secondary | ICD-10-CM | POA: Insufficient documentation

## 2018-06-11 DIAGNOSIS — I10 Essential (primary) hypertension: Secondary | ICD-10-CM | POA: Diagnosis present

## 2018-06-11 DIAGNOSIS — I2510 Atherosclerotic heart disease of native coronary artery without angina pectoris: Secondary | ICD-10-CM | POA: Diagnosis not present

## 2018-06-11 DIAGNOSIS — E1169 Type 2 diabetes mellitus with other specified complication: Secondary | ICD-10-CM | POA: Diagnosis present

## 2018-06-11 DIAGNOSIS — R079 Chest pain, unspecified: Secondary | ICD-10-CM

## 2018-06-11 DIAGNOSIS — E1122 Type 2 diabetes mellitus with diabetic chronic kidney disease: Secondary | ICD-10-CM | POA: Diagnosis not present

## 2018-06-11 DIAGNOSIS — E669 Obesity, unspecified: Secondary | ICD-10-CM | POA: Insufficient documentation

## 2018-06-11 LAB — CBC
HCT: 31.4 % — ABNORMAL LOW (ref 36.0–46.0)
Hemoglobin: 9.8 g/dL — ABNORMAL LOW (ref 12.0–15.0)
MCH: 29.7 pg (ref 26.0–34.0)
MCHC: 31.2 g/dL (ref 30.0–36.0)
MCV: 95.2 fL (ref 78.0–100.0)
PLATELETS: 240 10*3/uL (ref 150–400)
RBC: 3.3 MIL/uL — ABNORMAL LOW (ref 3.87–5.11)
RDW: 14.2 % (ref 11.5–15.5)
WBC: 7.4 10*3/uL (ref 4.0–10.5)

## 2018-06-11 LAB — COMPREHENSIVE METABOLIC PANEL
ALBUMIN: 3.8 g/dL (ref 3.5–5.0)
ALT: 38 U/L (ref 14–54)
ANION GAP: 12 (ref 5–15)
AST: 34 U/L (ref 15–41)
Alkaline Phosphatase: 161 U/L — ABNORMAL HIGH (ref 38–126)
BUN: 62 mg/dL — ABNORMAL HIGH (ref 6–20)
CALCIUM: 8.4 mg/dL — AB (ref 8.9–10.3)
CO2: 19 mmol/L — ABNORMAL LOW (ref 22–32)
Chloride: 109 mmol/L (ref 101–111)
Creatinine, Ser: 3.57 mg/dL — ABNORMAL HIGH (ref 0.44–1.00)
GFR, EST AFRICAN AMERICAN: 12 mL/min — AB (ref >60)
GFR, EST NON AFRICAN AMERICAN: 11 mL/min — AB (ref >60)
GLUCOSE: 124 mg/dL — AB (ref 65–99)
POTASSIUM: 5.4 mmol/L — AB (ref 3.5–5.1)
Sodium: 140 mmol/L (ref 135–145)
TOTAL PROTEIN: 7.6 g/dL (ref 6.5–8.1)
Total Bilirubin: 0.5 mg/dL (ref 0.3–1.2)

## 2018-06-11 LAB — TROPONIN I: Troponin I: <0.03 ng/mL (ref <0.03)

## 2018-06-11 MED ORDER — SODIUM CHLORIDE 0.9 % IV BOLUS
500.0000 mL | Freq: Once | INTRAVENOUS | Status: AC
  Administered 2018-06-11: 500 mL via INTRAVENOUS

## 2018-06-11 MED ORDER — SODIUM POLYSTYRENE SULFONATE 15 GM/60ML PO SUSP
30.0000 g | Freq: Once | ORAL | Status: AC
  Administered 2018-06-11: 30 g via ORAL
  Filled 2018-06-11: qty 120

## 2018-06-11 MED ORDER — ASPIRIN 81 MG PO CHEW
324.0000 mg | CHEWABLE_TABLET | Freq: Once | ORAL | Status: AC
  Administered 2018-06-11: 324 mg via ORAL
  Filled 2018-06-11: qty 4

## 2018-06-11 NOTE — ED Triage Notes (Signed)
Pt C/O chest pain that started around 1400. Pt reports SOB and diaphoresis. Pt takes she took tylenol with no relief.

## 2018-06-11 NOTE — ED Provider Notes (Signed)
[facility] EMERGENCY DEPARTMENT Provider Note   CSN: [id number] Arrival date & time: 06/11/18  2037     History   Chief Complaint Chief Complaint  Patient presents with  . Chest Pain    HPI Kristen Kaufman is a 84 y.o. female.  Patient presents with central chest pain that onset today around 2 PM.  She is difficult time saying the pain is constant or is coming and going.  It does radiate to her left shoulder.  She took Tylenol with partial relief.  She states the pain is "eased off now".  Denies any cardiac history denies having had heart attack in the past.  She does have a history of diabetes, hypertension and gout.  She states she is never had this kind of pain before.  There is no associated shortness of breath, cough or fever.  No nausea or vomiting.  Patient did have some shortness of breath and diaphoresis.  She denies any chest pain now.  The history is provided by the patient.  Chest Pain   Associated symptoms include nausea and shortness of breath. Pertinent negatives include no abdominal pain, no dizziness, no headaches, no vomiting and no weakness.    Past Medical History:  Diagnosis Date  . Chronic back pain   . Diabetes mellitus without complication ([place])   . Gout   . Hypertension   . Myocardial infarct ([place])   . Ulcer    stomach    Patient Active Problem List   Diagnosis Date Noted  . Gout 02/13/2018  . CAD (coronary artery disease) 02/13/2018  . Constipation 06/13/2017  . AKI (acute kidney injury) ([place]) 05/30/2017  . UTI (urinary tract infection), uncomplicated 40/98/1191  . UTI (urinary tract infection) 05/25/2017  . Hyperkalemia 05/25/2017  . Acute renal failure superimposed on stage 4 chronic kidney disease ([place])   . Anemia in stage 4 chronic kidney disease ([place])   . Renal insufficiency 05/24/2017  . Diabetes mellitus type 2 in obese ([place]) 05/24/2017  . Essential hypertension 05/24/2017    Past Surgical History:  Procedure Laterality Date  .  ABDOMINAL HYSTERECTOMY       OB History   None      Home Medications    Prior to Admission medications   Medication Sig Start Date End Date Taking? Authorizing Provider  acetaminophen (TYLENOL) 500 MG tablet Take 500 mg by mouth every 6 (six) hours as needed for mild pain or moderate pain.    [provider]  amLODipine (NORVASC) 10 MG tablet Take 10 mg by mouth daily.    [provider]  bisacodyl (DULCOLAX) 10 MG suppository Place 1 suppository (10 mg total) rectally daily as needed for mild constipation, moderate constipation or severe constipation. Patient not taking: Reported on 02/13/2018 06/15/17   Janece Canterbury, MD  colchicine 0.6 MG tablet Take 1 tablet (0.6 mg total) by mouth daily. 02/16/18   Kayleen Memos, DO  febuxostat (ULORIC) 40 MG tablet Take 1 tablet (40 mg total) by mouth daily. Patient not taking: Reported on 02/13/2018 06/04/17   Isaac Bliss, Rayford Halsted, MD  insulin glargine (LANTUS) 100 UNIT/ML injection Inject 0.3 mLs (30 Units total) into the skin at bedtime. Patient not taking: Reported on 02/13/2018 06/15/17   Janece Canterbury, MD  methocarbamol (ROBAXIN) 500 MG tablet Take 500 mg by mouth 2 (two) times daily as needed for muscle spasms.    [provider]  polyethylene glycol powder (GLYCOLAX/MIRALAX) powder Take 17 g by mouth daily. Patient  not taking: Reported on 02/13/2018 06/15/17   Janece Canterbury, MD  senna-docusate (SENOKOT-S) 8.6-50 MG tablet Take 1 tablet by mouth 2 (two) times daily. Patient not taking: Reported on 02/13/2018 06/15/17   Janece Canterbury, MD  sodium bicarbonate 650 MG tablet Take 1 tablet (650 mg total) by mouth 2 (two) times daily. Patient not taking: Reported on 02/13/2018 06/15/17   Janece Canterbury, MD    Family History Family History  Problem Relation Age of Onset  . Chronic Renal Failure Mother 69       required hemodialysis x 6 months  . Hypertension Mother   . Prostate cancer Brother     Social  History Social History   Tobacco Use  . Smoking status: Former Smoker    Years: 65.00    Last attempt to quit: 10/05/2006    Years since quitting: 11.6  . Smokeless tobacco: Never Used  Substance Use Topics  . Alcohol use: Yes    Comment: Quit in 2007 - h/o heavy use  . Drug use: No     Allergies   Influenza vaccines and Menactra [meningococcal a c y&w-135 conj]   Review of Systems Review of Systems  Constitutional: Negative for activity change and appetite change.  HENT: Negative for congestion and rhinorrhea.   Eyes: Negative for visual disturbance.  Respiratory: Positive for chest tightness and shortness of breath.   Cardiovascular: Positive for chest pain.  Gastrointestinal: Positive for nausea. Negative for abdominal pain and vomiting.  Genitourinary: Negative for dysuria, hematuria, vaginal bleeding and vaginal discharge.  Musculoskeletal: Negative for arthralgias and myalgias.  Neurological: Negative for dizziness, weakness, light-headedness and headaches.   all other systems are negative except as noted in the HPI and PMH.     Physical Exam Updated Vital Signs BP (!) 161/88 (BP Location: Left Arm)   Pulse 77   Temp 99.5 F (37.5 C) (Oral)   Resp 18   Ht 5\' 1"  (1.549 m)   Wt 80.7 kg (178 lb)   SpO2 100%   BMI 33.63 kg/m   Physical Exam  Constitutional: She is oriented to person, place, and time. She appears well-developed and well-nourished. No distress.  HENT:  Head: Normocephalic and atraumatic.  Mouth/Throat: Oropharynx is clear and moist. No oropharyngeal exudate.  Eyes: Pupils are equal, round, and reactive to light. Conjunctivae and EOM are normal.  Neck: Normal range of motion. Neck supple.  No meningismus.  Cardiovascular: Normal rate, regular rhythm, normal heart sounds and intact distal pulses.  No murmur heard. Pulmonary/Chest: Effort normal and breath sounds normal. No respiratory distress. She exhibits tenderness.  Central chest  tenderness that is worse with palpation.  Abdominal: Soft. There is no tenderness. There is no rebound and no guarding.  Musculoskeletal: Normal range of motion. She exhibits no edema or tenderness.  Neurological: She is alert and oriented to person, place, and time. No cranial nerve deficit. She exhibits normal muscle tone. Coordination normal.  No ataxia on finger to nose bilaterally. No pronator drift. 5/5 strength throughout. CN 2-12 intact.Equal grip strength. Sensation intact.   Skin: Skin is warm.  Psychiatric: She has a normal mood and affect. Her behavior is normal.  Nursing note and vitals reviewed.    ED Treatments / Results  Labs (all labs ordered are listed, but only abnormal results are displayed) Labs Reviewed  CBC - Abnormal; Notable for the following components:      Result Value   RBC 3.30 (*)    Hemoglobin 9.8 (*)  HCT 31.4 (*)    All other components within normal limits  COMPREHENSIVE METABOLIC PANEL - Abnormal; Notable for the following components:   Potassium 5.4 (*)    CO2 19 (*)    Glucose, Bld 124 (*)    BUN 62 (*)    Creatinine, Ser 3.57 (*)    Calcium 8.4 (*)    Alkaline Phosphatase 161 (*)    GFR calc non Af Amer 11 (*)    GFR calc Af Amer 12 (*)    All other components within normal limits  CBG MONITORING, ED - Abnormal; Notable for the following components:   Glucose-Capillary 123 (*)    All other components within normal limits  TROPONIN I  TROPONIN I  TROPONIN I  TROPONIN I  BASIC METABOLIC PANEL  CBC  I-STAT TROPONIN, ED    EKG EKG Interpretation  Date/Time:  Monday June 11 2018 20:39:19 EDT Ventricular Rate:  83 PR Interval:  152 QRS Duration: 70 QT Interval:  396 QTC Calculation: 465 R Axis:   -41 Text Interpretation:  Normal sinus rhythm Left axis deviation Cannot rule out Anterior infarct , age undetermined Abnormal ECG No significant change was found Confirmed by Ezequiel Essex [phone]) on 06/11/2018 10:56:48  PM   Radiology Dg Chest 2 View  Result Date: 06/11/2018 CLINICAL DATA:  84 year old female with chest pain. EXAM: CHEST - 2 VIEW COMPARISON:  Chest radiograph dated 05/25/2017 FINDINGS: There is shallow inspiration with bibasilar atelectatic changes. No focal consolidation, pleural effusion, or pneumothorax. Stable borderline cardiomegaly. No acute osseous pathology. IMPRESSION: No acute cardiopulmonary process.  No interval change. Electronically Signed   By: Anner Crete M.D.   On: 06/11/2018 22:23    Procedures Procedures (including critical care time)  Medications Ordered in ED Medications  aspirin chewable tablet 324 mg (324 mg Oral Given 06/11/18 2314)     Initial Impression / Assessment and Plan / ED Course  I have reviewed the triage vital signs and the nursing notes.  Pertinent labs & imaging results that were available during my care of the patient were reviewed by me and considered in my medical decision making (see chart for details).    Central chest pain since 2 PM.  EKG is nonischemic.  Pain is somewhat reproducible. Pain waxing and waning.   Initial troponin is negative.  Chest x-ray is negative  Creatinine slightly worse than baseline of 3. Patient hydrated gently given Kayexalate for potassium of 5.3.  There are no acute ST changes on EKG.  Patient states pain has resolved after taking Tylenol at home.  It is somewhat reproducible. Heart score is 5-6.  Patient with multiple risk factors and cannot completely rule out ACS.  Low suspicion for pulmonary embolism or aortic dissection.  Patient agreeable to observation admission.  Discussed with Dr. Manuella Ghazi.  CRITICAL CARE Performed by: Ezequiel Essex Total critical care time: 33 minutes Critical care time was exclusive of separately billable procedures and treating other patients. Critical care was necessary to treat or prevent imminent or life-threatening deterioration. Critical care was time spent personally  by me on the following activities: development of treatment plan with patient and/or surrogate as well as nursing, discussions with consultants, evaluation of patient's response to treatment, examination of patient, obtaining history from patient or surrogate, ordering and performing treatments and interventions, ordering and review of laboratory studies, ordering and review of radiographic studies, pulse oximetry and re-evaluation of patient's condition.  Final Clinical Impressions(s) / ED Diagnoses   Final diagnoses:  Nonspecific chest pain  Hyperkalemia    ED Discharge Orders    None       Maelie Chriswell, Annie Main, MD 06/12/18 [phone]

## 2018-06-11 NOTE — ED Notes (Signed)
Patient transported to X-ray 

## 2018-06-12 DIAGNOSIS — E669 Obesity, unspecified: Secondary | ICD-10-CM | POA: Diagnosis not present

## 2018-06-12 DIAGNOSIS — R079 Chest pain, unspecified: Secondary | ICD-10-CM

## 2018-06-12 DIAGNOSIS — M109 Gout, unspecified: Secondary | ICD-10-CM

## 2018-06-12 DIAGNOSIS — I10 Essential (primary) hypertension: Secondary | ICD-10-CM

## 2018-06-12 DIAGNOSIS — E1169 Type 2 diabetes mellitus with other specified complication: Secondary | ICD-10-CM | POA: Diagnosis not present

## 2018-06-12 DIAGNOSIS — R072 Precordial pain: Secondary | ICD-10-CM | POA: Diagnosis not present

## 2018-06-12 DIAGNOSIS — N184 Chronic kidney disease, stage 4 (severe): Secondary | ICD-10-CM

## 2018-06-12 DIAGNOSIS — E875 Hyperkalemia: Secondary | ICD-10-CM

## 2018-06-12 DIAGNOSIS — D631 Anemia in chronic kidney disease: Secondary | ICD-10-CM

## 2018-06-12 DIAGNOSIS — M069 Rheumatoid arthritis, unspecified: Secondary | ICD-10-CM | POA: Insufficient documentation

## 2018-06-12 DIAGNOSIS — I2510 Atherosclerotic heart disease of native coronary artery without angina pectoris: Secondary | ICD-10-CM | POA: Diagnosis not present

## 2018-06-12 DIAGNOSIS — R0789 Other chest pain: Secondary | ICD-10-CM | POA: Diagnosis not present

## 2018-06-12 LAB — LIPID PANEL
CHOLESTEROL: 201 mg/dL — AB (ref 0–200)
HDL: 35 mg/dL — ABNORMAL LOW (ref >40)
LDL CALC: 124 mg/dL — AB (ref 0–99)
Total CHOL/HDL Ratio: 5.7 RATIO
Triglycerides: 209 mg/dL — ABNORMAL HIGH (ref <150)
VLDL: 42 mg/dL — AB (ref 0–40)

## 2018-06-12 LAB — BASIC METABOLIC PANEL
ANION GAP: 8 (ref 5–15)
BUN: 59 mg/dL — AB (ref 8–23)
CO2: 21 mmol/L — AB (ref 22–32)
Calcium: 8.1 mg/dL — ABNORMAL LOW (ref 8.9–10.3)
Chloride: 114 mmol/L — ABNORMAL HIGH (ref 98–111)
Creatinine, Ser: 3.46 mg/dL — ABNORMAL HIGH (ref 0.44–1.00)
GFR calc Af Amer: 13 mL/min — ABNORMAL LOW (ref >60)
GFR calc non Af Amer: 11 mL/min — ABNORMAL LOW (ref >60)
GLUCOSE: 107 mg/dL — AB (ref 70–99)
POTASSIUM: 4.8 mmol/L (ref 3.5–5.1)
Sodium: 143 mmol/L (ref 135–145)

## 2018-06-12 LAB — CBC
HEMATOCRIT: 29.9 % — AB (ref 36.0–46.0)
HEMOGLOBIN: 9 g/dL — AB (ref 12.0–15.0)
MCH: 28.9 pg (ref 26.0–34.0)
MCHC: 30.1 g/dL (ref 30.0–36.0)
MCV: 96.1 fL (ref 78.0–100.0)
Platelets: 204 10*3/uL (ref 150–400)
RBC: 3.11 MIL/uL — ABNORMAL LOW (ref 3.87–5.11)
RDW: 14.3 % (ref 11.5–15.5)
WBC: 4.6 10*3/uL (ref 4.0–10.5)

## 2018-06-12 LAB — GLUCOSE, CAPILLARY
GLUCOSE-CAPILLARY: 98 mg/dL (ref 70–99)
Glucose-Capillary: 95 mg/dL (ref 70–99)

## 2018-06-12 LAB — TROPONIN I: Troponin I: <0.03 ng/mL (ref <0.03)

## 2018-06-12 LAB — CBG MONITORING, ED: Glucose-Capillary: 123 mg/dL — ABNORMAL HIGH (ref 70–99)

## 2018-06-12 MED ORDER — SODIUM CHLORIDE 0.9 % IV SOLN: 250.0000 mL | INTRAVENOUS | Status: DC | PRN

## 2018-06-12 MED ORDER — ASPIRIN EC 81 MG PO TBEC
81.0000 mg | DELAYED_RELEASE_TABLET | Freq: Every day | ORAL | Status: DC
  Administered 2018-06-12: 81 mg via ORAL
  Filled 2018-06-12: qty 1

## 2018-06-12 MED ORDER — AMLODIPINE BESYLATE 5 MG PO TABS
10.0000 mg | ORAL_TABLET | Freq: Every day | ORAL | Status: DC
  Administered 2018-06-12: 10 mg via ORAL
  Filled 2018-06-12: qty 2

## 2018-06-12 MED ORDER — SENNOSIDES-DOCUSATE SODIUM 8.6-50 MG PO TABS
1.0000 | ORAL_TABLET | Freq: Two times a day (BID) | ORAL | Status: DC
  Administered 2018-06-12: 1 via ORAL
  Filled 2018-06-12: qty 1

## 2018-06-12 MED ORDER — ASPIRIN 81 MG PO TBEC: 81.0000 mg | DELAYED_RELEASE_TABLET | Freq: Every day | ORAL | Status: DC

## 2018-06-12 MED ORDER — BISACODYL 10 MG RE SUPP: 10.0000 mg | Freq: Every day | RECTAL | Status: DC | PRN

## 2018-06-12 MED ORDER — SODIUM BICARBONATE 650 MG PO TABS
650.0000 mg | ORAL_TABLET | Freq: Two times a day (BID) | ORAL | Status: DC
  Administered 2018-06-12: 650 mg via ORAL
  Filled 2018-06-12 (×4): qty 1

## 2018-06-12 MED ORDER — ACETAMINOPHEN 650 MG RE SUPP: 650.0000 mg | Freq: Four times a day (QID) | RECTAL | Status: DC | PRN

## 2018-06-12 MED ORDER — FEBUXOSTAT 40 MG PO TABS
40.0000 mg | ORAL_TABLET | Freq: Every day | ORAL | Status: DC
  Administered 2018-06-12: 40 mg via ORAL
  Filled 2018-06-12: qty 1

## 2018-06-12 MED ORDER — INSULIN ASPART 100 UNIT/ML ~~LOC~~ SOLN
0.0000 [IU] | SUBCUTANEOUS | Status: DC
  Administered 2018-06-12: 2 [IU] via SUBCUTANEOUS
  Filled 2018-06-12: qty 1

## 2018-06-12 MED ORDER — ACETAMINOPHEN 325 MG PO TABS: 650.0000 mg | ORAL_TABLET | Freq: Four times a day (QID) | ORAL | Status: DC | PRN

## 2018-06-12 MED ORDER — SODIUM CHLORIDE 0.9% FLUSH: 3.0000 mL | INTRAVENOUS | Status: DC | PRN

## 2018-06-12 MED ORDER — ONDANSETRON HCL 4 MG/2ML IJ SOLN: 4.0000 mg | Freq: Four times a day (QID) | INTRAMUSCULAR | Status: DC | PRN

## 2018-06-12 MED ORDER — HEPARIN SODIUM (PORCINE) 5000 UNIT/ML IJ SOLN
5000.0000 [IU] | Freq: Three times a day (TID) | INTRAMUSCULAR | Status: DC
  Administered 2018-06-12: 5000 [IU] via SUBCUTANEOUS
  Filled 2018-06-12: qty 1

## 2018-06-12 MED ORDER — SODIUM CHLORIDE 0.9% FLUSH: 3.0000 mL | Freq: Two times a day (BID) | INTRAVENOUS | Status: DC

## 2018-06-12 MED ORDER — ACETAMINOPHEN 325 MG PO TABS: 650.0000 mg | ORAL_TABLET | ORAL | Status: DC | PRN

## 2018-06-12 MED ORDER — ONDANSETRON HCL 4 MG PO TABS: 4.0000 mg | ORAL_TABLET | Freq: Four times a day (QID) | ORAL | Status: DC | PRN

## 2018-06-12 MED ORDER — NITROGLYCERIN 0.4 MG SL SUBL: 0.4000 mg | SUBLINGUAL_TABLET | SUBLINGUAL | Status: DC | PRN

## 2018-06-12 NOTE — Progress Notes (Signed)
06/11/2018  8:45 PM  06/12/2018 7:36 AM  Kristen Kaufman was seen and examined.  The H&P by the admitting provider, orders, imaging was reviewed.  Please see new orders. Cardio team was consulted by admitting MD for stress testing.   Will continue to follow.   Vitals:   06/12/18 0500 06/12/18 0515  BP: 133/78   Pulse:    Resp: 14 16  Temp:    SpO2:     Results for orders placed or performed during the hospital encounter of 06/11/18  CBC  Result Value Ref Range   WBC 7.4 4.0 - 10.5 K/uL   RBC 3.30 (L) 3.87 - 5.11 MIL/uL   Hemoglobin 9.8 (L) 12.0 - 15.0 g/dL   HCT 31.4 (L) 36.0 - 46.0 %   MCV 95.2 78.0 - 100.0 fL   MCH 29.7 26.0 - 34.0 pg   MCHC 31.2 30.0 - 36.0 g/dL   RDW 14.2 11.5 - 15.5 %   Platelets 240 150 - 400 K/uL  Troponin I  Result Value Ref Range   Troponin I <0.03 <0.03 ng/mL  Comprehensive metabolic panel  Result Value Ref Range   Sodium 140 135 - 145 mmol/L   Potassium 5.4 (H) 3.5 - 5.1 mmol/L   Chloride 109 101 - 111 mmol/L   CO2 19 (L) 22 - 32 mmol/L   Glucose, Bld 124 (H) 65 - 99 mg/dL   BUN 62 (H) 6 - 20 mg/dL   Creatinine, Ser 3.57 (H) 0.44 - 1.00 mg/dL   Calcium 8.4 (L) 8.9 - 10.3 mg/dL   Total Protein 7.6 6.5 - 8.1 g/dL   Albumin 3.8 3.5 - 5.0 g/dL   AST 34 15 - 41 U/L   ALT 38 14 - 54 U/L   Alkaline Phosphatase 161 (H) 38 - 126 U/L   Total Bilirubin 0.5 0.3 - 1.2 mg/dL   GFR calc non Af Amer 11 (L) >60 mL/min   GFR calc Af Amer 12 (L) >60 mL/min   Anion gap 12 5 - 15  Troponin I  Result Value Ref Range   Troponin I <0.03 <0.03 ng/mL  CBC  Result Value Ref Range   WBC 4.6 4.0 - 10.5 K/uL   RBC 3.11 (L) 3.87 - 5.11 MIL/uL   Hemoglobin 9.0 (L) 12.0 - 15.0 g/dL   HCT 29.9 (L) 36.0 - 46.0 %   MCV 96.1 78.0 - 100.0 fL   MCH 28.9 26.0 - 34.0 pg   MCHC 30.1 30.0 - 36.0 g/dL   RDW 14.3 11.5 - 15.5 %   Platelets 204 150 - 400 K/uL  CBG monitoring, ED  Result Value Ref Range   Glucose-Capillary 123 (H) 70 - 99 mg/dL     Murvin Natal, MD [facility]

## 2018-06-12 NOTE — H&P (Addendum)
History and Physical    Kristen Kaufman YHC:[id number] DOB: May 30, 1931 DOA: 06/11/2018  PCP: Sherrilee Gilles, DO   Patient coming from: Home  Chief Complaint: Chest pain  HPI: Kristen Kaufman is a 84 y.o. 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  The pain appeared to radiate down her left arm.  She states that the pain is intermittent and she took some Tylenol with almost complete relief of her pain.  She states that her pain seems to worsen with any activity she does and seems to be better with rest.  She is noted to have history of CAD and even possibly prior MI, but denies both.  She says that she used to see a cardiologist in [place], but has not seen a cardiologist in quite some time.  She continues to take her medications as prescribed.  She did have some mild shortness of breath with her initial pain and this has now resolved.  She denies any further symptomatology at this time. She denies any nausea, palpitations, vomiting, diaphoresis, lightheadedness, or dizziness.   ED Course: Vital signs stable and patient appears to have stable hemoglobin levels.  Laboratory data with potassium of 5.4 as well as BUN 62 and creatinine 3.57 which is at her usual baseline.  Troponin of 0.03.  EKG with normal sinus rhythm at 83 bpm with nonspecific ST changes.  Two-view chest x-ray with no acute findings.  Patient has been given some Kayexalate as well as normal saline bolus of 500 mL and 30 g of Kayexalate.  Review of Systems: All others reviewed and otherwise negative.  Past Medical History:  Diagnosis Date  . Chronic back pain   . Diabetes mellitus without complication ([place])   . Gout   . Hypertension   . Myocardial infarct ([place])   . Ulcer    stomach    Past Surgical History:  Procedure  Laterality Date  . ABDOMINAL HYSTERECTOMY       reports that she quit smoking about 11 years ago. She quit after 65.00 years of use. She has never used smokeless tobacco. She reports that she drinks alcohol. She reports that she does not use drugs.  Allergies  Allergen Reactions  . Influenza Vaccines Anaphylaxis  . Menactra [Meningococcal A C Y&W-135 Conj]     Family History  Problem Relation Age of Onset  . Chronic Renal Failure Mother 21       required hemodialysis x 6 months  . Hypertension Mother   . Prostate cancer Brother     Prior to Admission medications   Medication Sig Start Date End Date Taking? Authorizing Provider  acetaminophen (TYLENOL) 500 MG tablet Take 500 mg by mouth every 6 (six) hours as needed for mild pain or moderate pain.    [provider]  amLODipine (NORVASC) 10 MG tablet Take 10 mg by mouth daily.    [provider]  bisacodyl (DULCOLAX) 10 MG suppository Place 1 suppository (10 mg total) rectally daily as needed for mild constipation, moderate constipation or severe constipation. Patient not taking: Reported on 02/13/2018 06/15/17   Janece Canterbury, MD  colchicine 0.6 MG tablet Take 1 tablet (0.6 mg total) by mouth daily. 02/16/18   Kayleen Memos, DO  febuxostat (ULORIC) 40 MG tablet Take 1 tablet (40 mg total) by mouth daily.  Patient not taking: Reported on 02/13/2018 06/04/17   Isaac Bliss, Rayford Halsted, MD  insulin glargine (LANTUS) 100 UNIT/ML injection Inject 0.3 mLs (30 Units total) into the skin at bedtime. Patient not taking: Reported on 02/13/2018 06/15/17   Janece Canterbury, MD  methocarbamol (ROBAXIN) 500 MG tablet Take 500 mg by mouth 2 (two) times daily as needed for muscle spasms.    [provider]  polyethylene glycol powder (GLYCOLAX/MIRALAX) powder Take 17 g by mouth daily. Patient not taking: Reported on 02/13/2018 06/15/17   Janece Canterbury, MD  senna-docusate (SENOKOT-S) 8.6-50 MG tablet Take 1 tablet by mouth 2  (two) times daily. Patient not taking: Reported on 02/13/2018 06/15/17   Janece Canterbury, MD  sodium bicarbonate 650 MG tablet Take 1 tablet (650 mg total) by mouth 2 (two) times daily. Patient not taking: Reported on 02/13/2018 06/15/17   Janece Canterbury, MD    Physical Exam: Vitals:   06/11/18 2345 06/12/18 0000 06/12/18 0015 06/12/18 0100  BP:  (!) 144/101  (!) 147/86  Pulse: 87 79 83 85  Resp:      Temp:      TempSrc:      SpO2: 100% 100% 100% 100%  Weight:      Height:        Constitutional: NAD, calm, comfortable Vitals:   06/11/18 2345 06/12/18 0000 06/12/18 0015 06/12/18 0100  BP:  (!) 144/101  (!) 147/86  Pulse: 87 79 83 85  Resp:      Temp:      TempSrc:      SpO2: 100% 100% 100% 100%  Weight:      Height:       Eyes: lids and conjunctivae normal ENMT: Mucous membranes are moist.  Neck: normal, supple Respiratory: clear to auscultation bilaterally. Normal respiratory effort. No accessory muscle use.  Currently on 3 L nasal cannula. Cardiovascular: Regular rate and rhythm, no murmurs. No extremity edema. Abdomen: no tenderness, no distention. Bowel sounds positive.  Musculoskeletal:  No joint deformity upper and lower extremities.  Appears to have some reproducibility with palpation to left substernal region and there is tenderness there. Skin: no rashes, lesions, ulcers.  Psychiatric: Normal judgment and insight. Alert and oriented x 3. Normal mood.   Labs on Admission: I have personally reviewed following labs and imaging studies  CBC: Recent Labs  Lab 06/11/18 2059  WBC 7.4  HGB 9.8*  HCT 31.4*  MCV 95.2  PLT 852   Basic Metabolic Panel: Recent Labs  Lab 06/11/18 2059  NA 140  K 5.4*  CL 109  CO2 19*  GLUCOSE 124*  BUN 62*  CREATININE 3.57*  CALCIUM 8.4*   GFR: Estimated Creatinine Clearance: 11.1 mL/min (A) (by C-G formula based on SCr of 3.57 mg/dL (H)). Liver Function Tests: Recent Labs  Lab 06/11/18 2059  AST 34  ALT 38  ALKPHOS  161*  BILITOT 0.5  PROT 7.6  ALBUMIN 3.8   No results for input(s): LIPASE, AMYLASE in the last 168 hours. No results for input(s): AMMONIA in the last 168 hours. Coagulation Profile: No results for input(s): INR, PROTIME in the last 168 hours. Cardiac Enzymes: Recent Labs  Lab 06/11/18 2059  TROPONINI <0.03   BNP (last 3 results) No results for input(s): PROBNP in the last 8760 hours. HbA1C: No results for input(s): HGBA1C in the last 72 hours. CBG: No results for input(s): GLUCAP in the last 168 hours. Lipid Profile: No results for input(s): CHOL, HDL, LDLCALC, TRIG, CHOLHDL, LDLDIRECT  in the last 72 hours. Thyroid Function Tests: No results for input(s): TSH, T4TOTAL, FREET4, T3FREE, THYROIDAB in the last 72 hours. Anemia Panel: No results for input(s): VITAMINB12, FOLATE, FERRITIN, TIBC, IRON, RETICCTPCT in the last 72 hours. Urine analysis:    Component Value Date/Time   COLORURINE STRAW (A) 06/13/2017 2250   APPEARANCEUR CLEAR 06/13/2017 2250   LABSPEC 1.009 06/13/2017 2250   PHURINE 5.0 06/13/2017 2250   GLUCOSEU NEGATIVE 06/13/2017 2250   HGBUR NEGATIVE 06/13/2017 2250   BILIRUBINUR NEGATIVE 06/13/2017 2250   KETONESUR NEGATIVE 06/13/2017 2250   PROTEINUR 30 (A) 06/13/2017 2250   NITRITE NEGATIVE 06/13/2017 2250   LEUKOCYTESUR SMALL (A) 06/13/2017 2250    Radiological Exams on Admission: Dg Chest 2 View  Result Date: 06/11/2018 CLINICAL DATA:  84 year old female with chest pain. EXAM: CHEST - 2 VIEW COMPARISON:  Chest radiograph dated 05/25/2017 FINDINGS: There is shallow inspiration with bibasilar atelectatic changes. No focal consolidation, pleural effusion, or pneumothorax. Stable borderline cardiomegaly. No acute osseous pathology. IMPRESSION: No acute cardiopulmonary process.  No interval change. Electronically Signed   By: Anner Crete M.D.   On: 06/11/2018 22:23    EKG: Independently reviewed. NSR at 83bpm.  Assessment/Plan Principal Problem:    Chest pain Active Problems:   Diabetes mellitus type 2 in obese [facility])   Essential hypertension   Anemia in stage 4 chronic kidney disease (H[facility])   Hyperkalemia   Gout   CAD (coronary artery disease)    1. Atypical chest pain with heart score 5-6.  This is in the setting of prior known history of CAD with little to no cardiology follow-up in the recent past.  Her chest pain is somewhat reproducible and I do suspect some elements of musculoskeletal pain, but I cannot completely rule out the possibility of ACS.  Continue trending of troponin with monitoring on telemetry with EKG in a.m.  Stress test inpatient in a.m. continue aspirin 81 mg daily.  N.p.o. except medications for now. Nitroglycerin prn chest pain. 2. Type 2 diabetes.  Hold Lantus now due to being n.p.o. and monitor on sliding scale insulin. 3. CKD stage IV with hyperkalemia.  Kayexalate administered.  Continue sodium bicarbonate.  Follow-up repeat labs in a.m.  4. HTN. Continue amlodipine. 5. Gout.  Continue on Uloric and hold colchicine for now. 6. Anemia of chronic disease.  Appears stable with no overt bleeding.  Follow CBC.   DVT prophylaxis: Heparin Code Status: DNR Family Communication: None at bedside Disposition Plan:ACS rule out with stress test in am Consults called:Cardiology in computer for stress test in am Admission status: Obs, tele   Toree Edling Darleen Crocker DO [facility] Pager [phone]  If 7PM-7AM, please contact night-coverage [email or web address] Password [email or web address]  06/12/2018, 1:20 AM

## 2018-06-12 NOTE — Discharge Instructions (Signed)
Seek medical care or return to ER if symptoms worsen or new problem develops.  Please follow up with the cardiology clinic in 3 weeks for recheck.  Take tylenol as needed for chest wall pain.  AVOID NSAIDS (IBUPROFEN, ALEVE, GOODY POWDER)    Follow with Primary MD  Sherrilee Gilles, DO  and other consultant's as instructed your Hospitalist MD  Please get a complete blood count and chemistry panel checked by your Primary MD at your next visit, and again as instructed by your Primary MD.  Get Medicines reviewed and adjusted: Please take all your medications with you for your next visit with your Primary MD  Laboratory/radiological data: Please request your Primary MD to go over all hospital tests and procedure/radiological results at the follow up, please ask your Primary MD to get all Hospital records sent to his/her office.  In some cases, they will be blood work, cultures and biopsy results pending at the time of your discharge. Please request that your primary care M.D. follows up on these results.  Also Note the following: If you experience worsening of your admission symptoms, develop shortness of breath, life threatening emergency, suicidal or homicidal thoughts you must seek medical attention immediately by calling 911 or calling your MD immediately  if symptoms less severe.  You must read complete instructions/literature along with all the possible adverse reactions/side effects for all the Medicines you take and that have been prescribed to you. Take any new Medicines after you have completely understood and accpet all the possible adverse reactions/side effects.   Do not drive when taking Pain medications or sleeping medications (Benzodaizepines)  Do not take more than prescribed Pain, Sleep and Anxiety Medications. It is not advisable to combine anxiety,sleep and pain medications without talking with your primary care practitioner  Special Instructions: If you have smoked or chewed  Tobacco  in the last 2 yrs please stop smoking, stop any regular Alcohol  and or any Recreational drug use.  Wear Seat belts while driving.  Please note: You were cared for by a hospitalist during your hospital stay. Once you are discharged, your primary care physician will handle any further medical issues. Please note that NO REFILLS for any discharge medications will be authorized once you are discharged, as it is imperative that you return to your primary care physician (or establish a relationship with a primary care physician if you do not have one) for your post hospital discharge needs so that they can reassess your need for medications and monitor your lab values.       Chest Wall Pain Chest wall pain is pain in or around the bones and muscles of your chest. Sometimes, an injury causes this pain. Sometimes, the cause may not be known. This pain may take several weeks or longer to get better. Follow these instructions at home: Pay attention to any changes in your symptoms. Take these actions to help with your pain:  Rest as told by your doctor.  Avoid activities that cause pain. Try not to use your chest, belly (abdominal), or side muscles to lift heavy things.  If directed, apply ice to the painful area: ? Put ice in a plastic bag. ? Place a towel between your skin and the bag. ? Leave the ice on for 20 minutes, 2-3 times per day.  Take over-the-counter and prescription medicines only as told by your doctor.  Do not use tobacco products, including cigarettes, chewing tobacco, and e-cigarettes. If you need help quitting, ask  your doctor.  Keep all follow-up visits as told by your doctor. This is important.  Contact a doctor if:  You have a fever.  Your chest pain gets worse.  You have new symptoms. Get help right away if:  You feel sick to your stomach (nauseous) or you throw up (vomit).  You feel sweaty or light-headed.  You have a cough with phlegm (sputum) or  you cough up blood.  You are short of breath. This information is not intended to replace advice given to you by your health care provider. Make sure you discuss any questions you have with your health care provider. Document Released: 05/23/2008 Document Revised: 05/12/2016 Document Reviewed: 03/02/2015 Elsevier Interactive Patient Education  2018 [place].   Nonspecific Chest Pain Chest pain can be caused by many different conditions. There is a chance that your pain could be related to something serious, such as a heart attack or a blood clot in your lungs. Chest pain can also be caused by conditions that are not life-threatening. If you have chest pain, it is very important to follow up with your doctor. Follow these instructions at home: Medicines  If you were prescribed an antibiotic medicine, take it as told by your doctor. Do not stop taking the antibiotic even if you start to feel better.  Take over-the-counter and prescription medicines only as told by your doctor. Lifestyle  Do not use any products that contain nicotine or tobacco, such as cigarettes and e-cigarettes. If you need help quitting, ask your doctor.  Do not drink alcohol.  Make lifestyle changes as told by your doctor. These may include: ? Getting regular exercise. Ask your doctor for some activities that are safe for you. ? Eating a heart-healthy diet. A diet specialist (dietitian) can help you to learn healthy eating options. ? Staying at a healthy weight. ? Managing diabetes, if needed. ? Lowering your stress, as with deep breathing or spending time in nature. General instructions  Avoid any activities that make you feel chest pain.  If your chest pain is because of heartburn: ? Raise (elevate) the head of your bed about 6 inches (15 cm). You can do this by putting blocks under the bed legs at the head of the bed. ? Do not sleep with extra pillows under your head. That does not help heartburn.  Keep  all follow-up visits as told by your doctor. This is important. This includes any further testing if your chest pain does not go away. Contact a doctor if:  Your chest pain does not go away.  You have a rash with blisters on your chest.  You have a fever.  You have chills. Get help right away if:  Your chest pain is worse.  You have a cough that gets worse, or you cough up blood.  You have very bad (severe) pain in your belly (abdomen).  You are very weak.  You pass out (faint).  You have either of these for no clear reason: ? Sudden chest discomfort. ? Sudden discomfort in your arms, back, neck, or jaw.  You have shortness of breath at any time.  You suddenly start to sweat, or your skin gets clammy.  You feel sick to your stomach (nauseous).  You throw up (vomit).  You suddenly feel light-headed or dizzy.  Your heart starts to beat fast, or it feels like it is skipping beats. These symptoms may be an emergency. Do not wait to see if the symptoms will go  away. Get medical help right away. Call your local emergency services (911 in the [place] Do not drive yourself to the hospital. This information is not intended to replace advice given to you by your health care provider. Make sure you discuss any questions you have with your health care provider. Document Released: 05/23/2008 Document Revised: 08/29/2016 Document Reviewed: 08/29/2016 Elsevier Interactive Patient Education  2017 [employer].

## 2018-06-12 NOTE — Discharge Summary (Addendum)
Physician Discharge Summary  Kristen Kaufman GLO:[id number] DOB: 1931-04-22 DOA: 06/11/2018  PCP: Sherrilee Gilles, DO Cardiology: [facility]   Admit date: 06/11/2018 Discharge date: 06/12/2018  Admitted From: Home  Disposition: Home  Recommendations for Outpatient Follow-up:  1. Follow up with PCP in 3 days for recheck 2. Follow up with cardiology in 3 weeks  3. Follow up with nephrologist in 2 weeks   Home Health: Resumption of services, RN  Discharge Condition: STABLE   CODE STATUS: DNR    Brief Hospitalization Summary: Please see all hospital notes, images, labs for full details of the hospitalization.  HPI: Kristen Kaufman is a 84 y.o. 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  The pain appeared to radiate down her left arm.  She states that the pain is intermittent and she took some Tylenol with almost complete relief of her pain.  She states that her pain seems to worsen with any activity she does and seems to be better with rest.  She is noted to have history of CAD and even possibly prior MI, but denies both.  She says that she used to see a cardiologist in [place], but has not seen a cardiologist in quite some time.  She continues to take her medications as prescribed.  She did have some mild shortness of breath with her initial pain and this has now resolved.  She denies any further symptomatology at this time. She denies any nausea, palpitations, vomiting, diaphoresis, lightheadedness, or dizziness.   ED Course: Vital signs stable and patient appears to have stable hemoglobin levels.  Laboratory data with potassium of 5.4 as well as BUN 62 and creatinine 3.57 which is at her usual baseline.  Troponin of 0.03.  EKG with normal sinus rhythm at 83 bpm with nonspecific ST  changes.  Two-view chest x-ray with no acute findings.  Patient has been given some Kayexalate as well as normal saline bolus of 500 mL and 30 g of Kayexalate.  The patient was admitted for observation.  Troponin was negative x 3.  The patient was seen by the cardiology service and no ischemic work up was recommended.  The patient was advised to take tylenol as needed for presumed pleuritic chest pain as her symptoms have improved with tylenol.  The patient was advised to avoid all NSAIDS because of CKD.  The patient was scheduled to see cardiology in 3 weeks.  The patient was advised to see her PCP in 3 days and follow up with nephrology in 2 weeks.  The patient verbalized understanding.  Pt was advised to seek medical care or return to ER if symptoms worsened.  The patient had some hyperkalemia on admission that was treated with kayexalate and resolved. The patient was advised to follow up with nephrologist.     Discharge Diagnoses:  Principal Problem:   Chest pain Active Problems:   Diabetes mellitus type 2 in obese [facility])   Essential hypertension   Anemia in stage 4 chronic kidney disease (HCC)   Hyperkalemia   Gout   CAD (coronary artery disease)    Discharge Instructions: Discharge Instructions    Call MD for:  difficulty breathing, headache or visual disturbances   Complete by:  As directed    Call MD for:  extreme fatigue   Complete by:  As  directed    Call MD for:  persistant dizziness or light-headedness   Complete by:  As directed    Call MD for:  persistant nausea and vomiting   Complete by:  As directed    Call MD for:  severe uncontrolled pain   Complete by:  As directed    Diet - low sodium heart healthy   Complete by:  As directed    Increase activity slowly   Complete by:  As directed      Allergies as of 06/12/2018      Reactions   Influenza Vaccines Anaphylaxis   Menactra [meningococcal A C Y&w-135 Conj]       Medication List    STOP taking these  medications   bisacodyl 10 MG suppository Commonly known as:  DULCOLAX   insulin glargine 100 UNIT/ML injection Commonly known as:  LANTUS   methocarbamol 500 MG tablet Commonly known as:  ROBAXIN   polyethylene glycol powder powder Commonly known as:  GLYCOLAX/MIRALAX   senna-docusate 8.6-50 MG tablet Commonly known as:  Senokot-S     TAKE these medications   acetaminophen 325 MG tablet Commonly known as:  TYLENOL Take 2 tablets (650 mg total) by mouth every 4 (four) hours as needed for mild pain or moderate pain. What changed:    medication strength  how much to take  when to take this   amLODipine 10 MG tablet Commonly known as:  NORVASC Take 10 mg by mouth daily.   aspirin 81 MG EC tablet Take 1 tablet (81 mg total) by mouth daily.   colchicine 0.6 MG tablet Take 1 tablet (0.6 mg total) by mouth daily.   febuxostat 40 MG tablet Commonly known as:  ULORIC Take 1 tablet (40 mg total) by mouth daily.   sodium bicarbonate 650 MG tablet Take 1 tablet (650 mg total) by mouth 2 (two) times daily.      Follow-up Information    Erma Heritage, PA-C Follow up on 07/12/2018.   Specialties:  Physician Assistant, Cardiology Why:  Cardiology Hospital Follow-Up on 07/12/2018 at 3:30PM.  Contact information: [place] [id number] [address], Elizabeth, DO. Schedule an appointment as soon as possible for a visit in 3 day(s).   Specialty:  Family Medicine Why:  Hospital Follow up  Contact information: [place] [address] [phone]          Allergies  Allergen Reactions  . Influenza Vaccines Anaphylaxis  . Menactra [Meningococcal A C Y&W-135 Conj]    Allergies as of 06/12/2018      Reactions   Influenza Vaccines Anaphylaxis   Menactra [meningococcal A C Y&w-135 Conj]       Medication List    STOP taking these medications   bisacodyl 10 MG suppository Commonly known as:  DULCOLAX   insulin glargine 100  UNIT/ML injection Commonly known as:  LANTUS   methocarbamol 500 MG tablet Commonly known as:  ROBAXIN   polyethylene glycol powder powder Commonly known as:  GLYCOLAX/MIRALAX   senna-docusate 8.6-50 MG tablet Commonly known as:  Senokot-S     TAKE these medications   acetaminophen 325 MG tablet Commonly known as:  TYLENOL Take 2 tablets (650 mg total) by mouth every 4 (four) hours as needed for mild pain or moderate pain. What changed:    medication strength  how much to take  when to take this   amLODipine 10 MG tablet Commonly  known as:  NORVASC Take 10 mg by mouth daily.   aspirin 81 MG EC tablet Take 1 tablet (81 mg total) by mouth daily.   colchicine 0.6 MG tablet Take 1 tablet (0.6 mg total) by mouth daily.   febuxostat 40 MG tablet Commonly known as:  ULORIC Take 1 tablet (40 mg total) by mouth daily.   sodium bicarbonate 650 MG tablet Take 1 tablet (650 mg total) by mouth 2 (two) times daily.       Procedures/Studies: Dg Chest 2 View  Result Date: 06/11/2018 CLINICAL DATA:  84 year old female with chest pain. EXAM: CHEST - 2 VIEW COMPARISON:  Chest radiograph dated 05/25/2017 FINDINGS: There is shallow inspiration with bibasilar atelectatic changes. No focal consolidation, pleural effusion, or pneumothorax. Stable borderline cardiomegaly. No acute osseous pathology. IMPRESSION: No acute cardiopulmonary process.  No interval change. Electronically Signed   By: Anner Crete M.D.   On: 06/11/2018 22:23      Subjective: Pt still has some chest pain with deep breaths but overall better with tylenol.  No SOB.     Discharge Exam: Vitals:   06/12/18 0515 06/12/18 0743  BP:  (!) 167/97  Pulse:  87  Resp: 16 20  Temp:  97.6 F (36.4 C)  SpO2:  100%   Vitals:   06/12/18 0400 06/12/18 0500 06/12/18 0515 06/12/18 0743  BP: (!) 105/49 133/78  (!) 167/97  Pulse:    87  Resp:  14 16 20   Temp:    97.6 F (36.4 C)  TempSrc:    Oral  SpO2:    100%   Weight:      Height:        General: Pt is alert, awake, not in acute distress Cardiovascular: RRR, S1/S2 +, no rubs, no gallops Respiratory: CTA bilaterally, no wheezing, no rhonchi Abdominal: Soft, NT, ND, bowel sounds + Extremities: no edema, no cyanosis   The results of significant diagnostics from this hospitalization (including imaging, microbiology, ancillary and laboratory) are listed below for reference.     Microbiology: No results found for this or any previous visit (from the past 240 hour(s)).   Labs: BNP (last 3 results) No results for input(s): BNP in the last 8760 hours. Basic Metabolic Panel: Recent Labs  Lab 06/11/18 2059 06/12/18 0718  NA 140 143  K 5.4* 4.8  CL 109 114*  CO2 19* 21*  GLUCOSE 124* 107*  BUN 62* 59*  CREATININE 3.57* 3.46*  CALCIUM 8.4* 8.1*   Liver Function Tests: Recent Labs  Lab 06/11/18 2059  AST 34  ALT 38  ALKPHOS 161*  BILITOT 0.5  PROT 7.6  ALBUMIN 3.8   No results for input(s): LIPASE, AMYLASE in the last 168 hours. No results for input(s): AMMONIA in the last 168 hours. CBC: Recent Labs  Lab 06/11/18 2059 06/12/18 0718  WBC 7.4 4.6  HGB 9.8* 9.0*  HCT 31.4* 29.9*  MCV 95.2 96.1  PLT 240 204   Cardiac Enzymes: Recent Labs  Lab 06/11/18 2059 06/12/18 0143 06/12/18 0718  TROPONINI <0.03 <0.03 <0.03   BNP: Invalid input(s): POCBNP CBG: Recent Labs  Lab 06/12/18 0400 06/12/18 0755  GLUCAP 123* 98   D-Dimer No results for input(s): DDIMER in the last 72 hours. Hgb A1c No results for input(s): HGBA1C in the last 72 hours. Lipid Profile No results for input(s): CHOL, HDL, LDLCALC, TRIG, CHOLHDL, LDLDIRECT in the last 72 hours. Thyroid function studies No results for input(s): TSH, T4TOTAL, T3FREE, THYROIDAB in the  last 72 hours.  Invalid input(s): FREET3 Anemia work up No results for input(s): VITAMINB12, FOLATE, FERRITIN, TIBC, IRON, RETICCTPCT in the last 72 hours. Urinalysis    Component  Value Date/Time   COLORURINE STRAW (A) 06/13/2017 2250   APPEARANCEUR CLEAR 06/13/2017 2250   LABSPEC 1.009 06/13/2017 2250   PHURINE 5.0 06/13/2017 2250   GLUCOSEU NEGATIVE 06/13/2017 2250   HGBUR NEGATIVE 06/13/2017 2250   BILIRUBINUR NEGATIVE 06/13/2017 2250   Bee 06/13/2017 2250   PROTEINUR 30 (A) 06/13/2017 2250   NITRITE NEGATIVE 06/13/2017 2250   LEUKOCYTESUR SMALL (A) 06/13/2017 2250   Sepsis Labs Invalid input(s): PROCALCITONIN,  WBC,  LACTICIDVEN Microbiology No results found for this or any previous visit (from the past 240 hour(s)).  Time coordinating discharge:   SIGNED:  Irwin Brakeman, MD  [facility] 06/12/2018, 10:41 AM Pager [phone]  If 7PM-7AM, please contact night-coverage [email or web address] Password [email or web address]

## 2018-06-12 NOTE — Consult Note (Addendum)
Cardiology Consult    Patient ID: Kristen Kaufman; [id number]; 01/21/1931   Admit date: 06/11/2018 Date of Consult: 06/12/2018  Primary Care Provider: Sherrilee Gilles, DO Primary Cardiologist: Previously followed in [place], [place]  Patient Profile    Kristen Kaufman is a 84 y.o. female with past medical history of CAD, HTN, Type 2 DM, Stage 4 CKD and chronic anemia who is being seen today for the evaluation of chest pain at the request of Dr. Manuella Ghazi.   History of Present Illness    Kristen Kaufman reports being in her usual state of health until yesterday afternoon when she developed chest pain while sitting on her porch. She took 2 Tylenol tablets with no improvement in her symptoms, therefore she came to the ED later in the day for further evaluation. She describes the pain as a sharp pain which started along her sternal region but also radiated into her left shoulder and made it difficult to move her left arm. The pain started to resolve while she was in the ED and she says it has mostly improved at this time, but is worse with certain movements. Reports associated dyspnea but denies any nausea, vomiting, or diaphoresis. No recent orthopnea or PND. Has chronic edema by her report.   She is not active at baseline and uses an Transport planner at the grocery store for shopping due to chronic back pain. Denies any symptoms of chest pain or significant dyspnea in the weeks leading up to her admission.  She has a history of CAD listed in her chart but denies any known history of CAD or prior MI's.  Does have HTN, Type II DM, and Stage IV CKD. Follows with a Nephrologist on a monthly basis but is unable to recall the name of her MD.  Denies any known family history of CAD. She is a former smoker but quit over 10 years ago.  Initial labs show WBC 7.4, Hgb 9.8, platelets 240, Na+ 140, K+ 5.4, creatinine 3.57 (baseline 2.9- 3.0). Initial and repeat troponin values have been negative. CXR shows no acute  cardiopulmonary processes. EKG shows NSR, HR 83, with LAD and no acute ST changes when compared to prior tracings.    Past Medical History:  Diagnosis Date  . Chronic back pain   . Diabetes mellitus without complication ([place])   . Gout   . Hypertension   . Myocardial infarct ([place])   . Ulcer    stomach    Past Surgical History:  Procedure Laterality Date  . ABDOMINAL HYSTERECTOMY       Home Medications:  Prior to Admission medications   Medication Sig Start Date End Date Taking? Authorizing Provider  acetaminophen (TYLENOL) 500 MG tablet Take 500 mg by mouth every 6 (six) hours as needed for mild pain or moderate pain.    [provider]  amLODipine (NORVASC) 10 MG tablet Take 10 mg by mouth daily.    [provider]  bisacodyl (DULCOLAX) 10 MG suppository Place 1 suppository (10 mg total) rectally daily as needed for mild constipation, moderate constipation or severe constipation. Patient not taking: Reported on 02/13/2018 06/15/17   Janece Canterbury, MD  colchicine 0.6 MG tablet Take 1 tablet (0.6 mg total) by mouth daily. 02/16/18   Kayleen Memos, DO  febuxostat (ULORIC) 40 MG tablet Take 1 tablet (40 mg total) by mouth daily. Patient not taking: Reported on 02/13/2018 06/04/17   Isaac Bliss, Rayford Halsted, MD  insulin glargine (LANTUS) 100 UNIT/ML injection Inject  0.3 mLs (30 Units total) into the skin at bedtime. Patient not taking: Reported on 02/13/2018 06/15/17   Janece Canterbury, MD  methocarbamol (ROBAXIN) 500 MG tablet Take 500 mg by mouth 2 (two) times daily as needed for muscle spasms.    [provider]  polyethylene glycol powder (GLYCOLAX/MIRALAX) powder Take 17 g by mouth daily. Patient not taking: Reported on 02/13/2018 06/15/17   Janece Canterbury, MD  senna-docusate (SENOKOT-S) 8.6-50 MG tablet Take 1 tablet by mouth 2 (two) times daily. Patient not taking: Reported on 02/13/2018 06/15/17   Janece Canterbury, MD  sodium bicarbonate 650 MG tablet  Take 1 tablet (650 mg total) by mouth 2 (two) times daily. Patient not taking: Reported on 02/13/2018 06/15/17   Janece Canterbury, MD    Inpatient Medications: Scheduled Meds: . amLODipine  10 mg Oral Daily  . aspirin EC  81 mg Oral Daily  . febuxostat  40 mg Oral Daily  . heparin  5,000 Units Subcutaneous Q8H  . insulin aspart  0-15 Units Subcutaneous Q4H  . senna-docusate  1 tablet Oral BID  . sodium bicarbonate  650 mg Oral BID  . sodium chloride flush  3 mL Intravenous Q12H   Continuous Infusions: . sodium chloride     PRN Meds: sodium chloride, acetaminophen **OR** acetaminophen, bisacodyl, nitroGLYCERIN, ondansetron **OR** ondansetron (ZOFRAN) IV, sodium chloride flush  Allergies:    Allergies  Allergen Reactions  . Influenza Vaccines Anaphylaxis  . Menactra [Meningococcal A C Y&W-135 Conj]     Social History:   Social History   Socioeconomic History  . Marital status: Single    Spouse name: Not on file  . Number of children: Not on file  . Years of education: Not on file  . Highest education level: Not on file  Occupational History  . Occupation: Retired  Scientific laboratory technician  . Financial resource strain: Not on file  . Food insecurity:    Worry: Not on file    Inability: Not on file  . Transportation needs:    Medical: Not on file    Non-medical: Not on file  Tobacco Use  . Smoking status: Former Smoker    Years: 65.00    Last attempt to quit: 10/05/2006    Years since quitting: 11.6  . Smokeless tobacco: Never Used  Substance and Sexual Activity  . Alcohol use: Yes    Comment: Quit in 2007 - h/o heavy use  . Drug use: No  . Sexual activity: Not on file  Lifestyle  . Physical activity:    Days per week: Not on file    Minutes per session: Not on file  . Stress: Not on file  Relationships  . Social connections:    Talks on phone: Not on file    Gets together: Not on file    Attends religious service: Not on file    Active member of club or  organization: Not on file    Attends meetings of clubs or organizations: Not on file    Relationship status: Not on file  . Intimate partner violence:    Fear of current or ex partner: Not on file    Emotionally abused: Not on file    Physically abused: Not on file    Forced sexual activity: Not on file  Other Topics Concern  . Not on file  Social History Narrative  . Not on file     Family History:    Family History  Problem Relation Age of Onset  .  Chronic Renal Failure Mother 62       required hemodialysis x 6 months  . Hypertension Mother   . Prostate cancer Brother       Review of Systems    General:  No chills, fever, night sweats or weight changes.  Cardiovascular:  No dyspnea on exertion, edema, orthopnea, palpitations, paroxysmal nocturnal dyspnea. Positive for chest pain.  Dermatological: No rash, lesions/masses Respiratory: No cough, dyspnea Urologic: No hematuria, dysuria Abdominal:   No nausea, vomiting, diarrhea, bright red blood per rectum, melena, or hematemesis Neurologic:  No visual changes, wkns, changes in mental status. All other systems reviewed and are otherwise negative except as noted above.  Physical Exam/Data    Vitals:   06/12/18 0300 06/12/18 0400 06/12/18 0500 06/12/18 0515  BP: 140/61 (!) 105/49 133/78   Pulse:      Resp:   14 16  Temp:      TempSrc:      SpO2:      Weight:      Height:        Intake/Output Summary (Last 24 hours) at 06/12/2018 0721 Last data filed at 06/11/2018 2347 Gross per 24 hour  Intake 500 ml  Output -  Net 500 ml   Filed Weights   06/11/18 2043  Weight: 178 lb (80.7 kg)   Body mass index is 33.63 kg/m.   General: Pleasant, elderly African American female appearing in NAD Psych: Normal affect. Neuro: Alert and oriented X 3. Moves all extremities spontaneously. HEENT: Normal  Neck: Supple without bruits or JVD. Lungs:  Resp regular and unlabored, CTA without wheezing or rales. Heart: RRR no s3,  s4, 2/6 diastolic murmur along Apex. Tender to palpation along sternum.  Abdomen: Soft, non-tender, non-distended, BS + x 4.  Extremities: No clubbing, cyanosis or edema. DP/PT/Radials 2+ and equal bilaterally.   EKG:  The EKG was personally reviewed and demonstrates: NSR, HR 83, with LAD and no acute ST changes when compared to prior tracings.   Telemetry:  Telemetry was personally reviewed and demonstrates:  NSR, in 70's to 80's.    Labs/Studies     Relevant CV Studies:  Echocardiogram: 05/2017 Study Conclusions  - Left ventricle: The cavity size was normal. Systolic function was   vigorous. The estimated ejection fraction was in the range of 65%   to 70%. Wall motion was normal; there were no regional wall   motion abnormalities. Doppler parameters are consistent with   abnormal left ventricular relaxation (grade 1 diastolic   dysfunction). Doppler parameters are consistent with high   ventricular filling pressure. Moderate to severe LVH. - Mitral valve: Calcified annulus. Mildly calcified leaflets . The   findings are consistent with mild stenosis. There was mild   regurgitation.   Laboratory Data:  Chemistry Recent Labs  Lab 06/11/18 2059  NA 140  K 5.4*  CL 109  CO2 19*  GLUCOSE 124*  BUN 62*  CREATININE 3.57*  CALCIUM 8.4*  GFRNONAA 11*  GFRAA 12*  ANIONGAP 12    Recent Labs  Lab 06/11/18 2059  PROT 7.6  ALBUMIN 3.8  AST 34  ALT 38  ALKPHOS 161*  BILITOT 0.5   Hematology Recent Labs  Lab 06/11/18 2059  WBC 7.4  RBC 3.30*  HGB 9.8*  HCT 31.4*  MCV 95.2  MCH 29.7  MCHC 31.2  RDW 14.2  PLT 240   Cardiac Enzymes Recent Labs  Lab 06/11/18 2059 06/12/18 0143  TROPONINI <0.03 <0.03   No results  for input(s): TROPIPOC in the last 168 hours.  BNPNo results for input(s): BNP, PROBNP in the last 168 hours.  DDimer No results for input(s): DDIMER in the last 168 hours.  Radiology/Studies:  Dg Chest 2 View  Result Date:  06/11/2018 CLINICAL DATA:  84 year old female with chest pain. EXAM: CHEST - 2 VIEW COMPARISON:  Chest radiograph dated 05/25/2017 FINDINGS: There is shallow inspiration with bibasilar atelectatic changes. No focal consolidation, pleural effusion, or pneumothorax. Stable borderline cardiomegaly. No acute osseous pathology. IMPRESSION: No acute cardiopulmonary process.  No interval change. Electronically Signed   By: Anner Crete M.D.   On: 06/11/2018 22:23    Assessment & Plan    1. Atypical Chest Pain - the patient reports developing chest pain while sitting on her porch yesterday which lasted for over 8 hours and resolved while in the ED. No association with exertion but was worse with certain movements. Her pain has resolved at this time but represents with palpation along her sternum.  - initial labs show WBC 7.4, Hgb 9.8, platelets 240, Na+ 140, K+ 5.4, creatinine 3.57 (baseline 2.9- 3.0). Initial and repeat troponin values have been negative. EKG shows NSR, HR 83, with LAD and no acute ST changes when compared to prior tracings.  - Her pain overall seems atypical for a cardiac etiology but she does have cardiac risk factors including HTN, CKD, and tobacco use and while a history of CAD is listed in her chart, she denies this. She has been NPO since midnight for consideration of stress testing. Will review with Dr. Harl Bowie in regards to inpatient versus outpatient evaluation. Would need to be a Lexiscan as she is unable to walk on the treadmill due to chronic back pain. If noted to have ischemia, would focus on medical management given her Stage 4-5 CKD. Continue ASA. Will check FLP.   2. HTN - BP has been variable at 105/49 - 178/143 since admission, improved to 133/78 this AM.  - continue PTA Amlodipine 10mg  daily. Consider addition of BB therapy if additional BP control is needed.   3. IDDM - no recent Hgb A1c on file. Fasting glucose at 98 this AM. Per admitting team.   4. Stage 4-5  CKD - baseline creatinine 2.9- 3.0. At 3.57 on admission with GFR at 12. Followed by Nephrology in [place], [place].      For questions or updates, please contact [facility] Please consult [email or web address] for contact info under Cardiology/STEMI.  Signed, Erma Heritage, PA-C 06/12/2018, 7:21 AM Pager: [phone]  Attending note  Patient seen and disucssed with PA Ahmed Prima, I agree with her documentation above. 84 yo female DNR status, CKD IV, chronic anemia, gout, CAD, DM2, HTN admitted with chest pain. She describes to me noncardiac chest pain. Symptoms started yesterday around 2pm, sharp pain midchest 10/10 with some associated SOB. Worst with deep breathing. Has been constant though varying in severity since onset (20 hrs at this time). Denies any prior issues with chest pain.   Trop neg x3 EKG SR, no ischemic changes 05/2017 echo LVEF 15-40%, grade I diatolic dysfunction, mod to severe LVH, mild MS   Noncardiac chest pain. Ongoing 20 hours constant, worst with deep breathing, and chest wall very tender to palpation. Probable MSK inflammation. Symptoms did improve with tylenol, would continue, avoid NSAIDs due to her renal function. No objective evidence of ischemia by EKG or enzymes. No plans for ischemic tesitng, would have her f/u in cardiology clinic in 3 weeks. We will sign  off   Carlyle Dolly MD

## 2018-06-12 NOTE — Progress Notes (Signed)
IV discontinued,catheter intact. Discharge instructions given on medications and follow up visits,patient verbalized understanding.Accompanied by staff to an awaiting vehicle. 

## 2018-07-11 NOTE — Progress Notes (Deleted)
Cardiology Office Note    Date:  07/11/2018   ID:  Kristen Kaufman, DOB 06-21-1931, MRN [id number]  PCP:  Sherrilee Gilles, DO  Cardiologist: Previously followed in [place], [place]  No chief complaint on file.   History of Present Illness:    Kristen Kaufman is a 84 y.o. female with past medical history of HTN, Type 2 DM, Stage 4 CKD and chronic anemia who presents to the office today for hospital follow-up.  She was recently admitted to [facility] from 6/24 to 06/12/2018 for evaluation of chest discomfort which occurred while sitting on her porch. She described the pain as a sharp discomfort which radiated into her left shoulder and made it difficult to move her left arm. Symptoms did improve with the use of Tylenol. CAD had been listed in her past medical history but she denied any known history of CAD or prior MI's. Her initial and cyclic troponin values were negative and her EKG showed normal sinus rhythm with no acute ST changes when compared to prior tracings. Creatinine was elevated to 3.57 on admission with a baseline of 2.9 to 3.0. With her pain being constant for over 20 hours, worse with deep breathing, and her chest wall being tender to palpation, her symptoms were thought to be most consistent with a musculoskeletal etiology and no further ischemic testing was pursued.   Past Medical History:  Diagnosis Date  . Chronic back pain   . Diabetes mellitus without complication ([place])   . Gout   . Hypertension   . Myocardial infarct ([place])   . Ulcer    stomach    Past Surgical History:  Procedure Laterality Date  . ABDOMINAL HYSTERECTOMY      Current Medications: Outpatient Medications Prior to Visit  Medication Sig Dispense Refill  . acetaminophen (TYLENOL) 325 MG tablet Take 2 tablets (650 mg total) by mouth every 4 (four) hours as needed for mild pain or moderate pain.    Marland Kitchen amLODipine (NORVASC) 10 MG tablet Take 10 mg by mouth daily.    Marland Kitchen aspirin EC 81 MG EC tablet Take 1 tablet  (81 mg total) by mouth daily.    . colchicine 0.6 MG tablet Take 1 tablet (0.6 mg total) by mouth daily. 4 tablet 0  . febuxostat (ULORIC) 40 MG tablet Take 1 tablet (40 mg total) by mouth daily. (Patient not taking: Reported on 02/13/2018) 30 tablet 1  . sodium bicarbonate 650 MG tablet Take 1 tablet (650 mg total) by mouth 2 (two) times daily. (Patient not taking: Reported on 02/13/2018) 60 tablet 0   No facility-administered medications prior to visit.      Allergies:   Influenza vaccines and Menactra [meningococcal a c y&w-135 conj]   Social History   Socioeconomic History  . Marital status: Single    Spouse name: Not on file  . Number of children: Not on file  . Years of education: Not on file  . Highest education level: Not on file  Occupational History  . Occupation: Retired  Scientific laboratory technician  . Financial resource strain: Not on file  . Food insecurity:    Worry: Not on file    Inability: Not on file  . Transportation needs:    Medical: Not on file    Non-medical: Not on file  Tobacco Use  . Smoking status: Former Smoker    Years: 65.00    Last attempt to quit: 10/05/2006    Years since quitting: 11.7  . Smokeless tobacco: Never  Used  Substance and Sexual Activity  . Alcohol use: Yes    Comment: Quit in 2007 - h/o heavy use  . Drug use: No  . Sexual activity: Not on file  Lifestyle  . Physical activity:    Days per week: Not on file    Minutes per session: Not on file  . Stress: Not on file  Relationships  . Social connections:    Talks on phone: Not on file    Gets together: Not on file    Attends religious service: Not on file    Active member of club or organization: Not on file    Attends meetings of clubs or organizations: Not on file    Relationship status: Not on file  Other Topics Concern  . Not on file  Social History Narrative  . Not on file     Family History:  The patient's ***family history includes Chronic Renal Failure (age of onset: 91) in  her mother; Hypertension in her mother; Prostate cancer in her brother.   Review of Systems:   Please see the history of present illness.     General:  No chills, fever, night sweats or weight changes.  Cardiovascular:  No chest pain, dyspnea on exertion, edema, orthopnea, palpitations, paroxysmal nocturnal dyspnea. Dermatological: No rash, lesions/masses Respiratory: No cough, dyspnea Urologic: No hematuria, dysuria Abdominal:   No nausea, vomiting, diarrhea, bright red blood per rectum, melena, or hematemesis Neurologic:  No visual changes, wkns, changes in mental status. All other systems reviewed and are otherwise negative except as noted above.   Physical Exam:    VS:  There were no vitals taken for this visit.   General: Well developed, well nourished,female appearing in no acute distress. Head: Normocephalic, atraumatic, sclera non-icteric, no xanthomas, nares are without discharge.  Neck: No carotid bruits. JVD not elevated.  Lungs: Respirations regular and unlabored, without wheezes or rales.  Heart: ***Regular rate and rhythm. No S3 or S4.  No murmur, no rubs, or gallops appreciated. Abdomen: Soft, non-tender, non-distended with normoactive bowel sounds. No hepatomegaly. No rebound/guarding. No obvious abdominal masses. Msk:  Strength and tone appear normal for age. No joint deformities or effusions. Extremities: No clubbing or cyanosis. No edema.  Distal pedal pulses are 2+ bilaterally. Neuro: Alert and oriented X 3. Moves all extremities spontaneously. No focal deficits noted. Psych:  Responds to questions appropriately with a normal affect. Skin: No rashes or lesions noted  Wt Readings from Last 3 Encounters:  06/11/18 178 lb (80.7 kg)  02/14/18 171 lb 15.3 oz (78 kg)  06/13/17 189 lb (85.7 kg)        Studies/Labs Reviewed:   EKG:  EKG is*** ordered today.  The ekg ordered today demonstrates ***  Recent Labs: 06/11/2018: ALT 38 06/12/2018: BUN 59; Creatinine,  Ser 3.46; Hemoglobin 9.0; Platelets 204; Potassium 4.8; Sodium 143   Lipid Panel    Component Value Date/Time   CHOL 201 (H) 06/12/2018 0718   TRIG 209 (H) 06/12/2018 0718   HDL 35 (L) 06/12/2018 0718   CHOLHDL 5.7 06/12/2018 0718   VLDL 42 (H) 06/12/2018 0718   LDLCALC 124 (H) 06/12/2018 0718    Additional studies/ records that were reviewed today include:   Echocardiogram: 05/2017 Study Conclusions  - Left ventricle: The cavity size was normal. Systolic function was   vigorous. The estimated ejection fraction was in the range of 65%   to 70%. Wall motion was normal; there were no regional wall  motion abnormalities. Doppler parameters are consistent with   abnormal left ventricular relaxation (grade 1 diastolic   dysfunction). Doppler parameters are consistent with high   ventricular filling pressure. Moderate to severe LVH. - Mitral valve: Calcified annulus. Mildly calcified leaflets . The   findings are consistent with mild stenosis. There was mild   regurgitation.  Assessment:    No diagnosis found.   Plan:   In order of problems listed above:  1. Atypical Chest Pain - ***  2. HTN - ***  3. IDDM - ***.   4. Stage 4-5 CKD - baseline creatinine 2.9- 3.0. ***   Medication Adjustments/Labs and Tests Ordered: Current medicines are reviewed at length with the patient today.  Concerns regarding medicines are outlined above.  Medication changes, Labs and Tests ordered today are listed in the Patient Instructions below. There are no Patient Instructions on file for this visit.   Signed, Erma Heritage, PA-C  07/11/2018 11:45 AM    [facility] [address] [place], [place] [address] Phone: [phone]

## 2018-07-12 ENCOUNTER — Ambulatory Visit: Payer: Medicaid - Out of State | Admitting: Student

## 2018-07-13 ENCOUNTER — Encounter: Payer: Self-pay | Admitting: Student

## 2018-10-30 ENCOUNTER — Emergency Department (HOSPITAL_COMMUNITY)
Admission: EM | Admit: 2018-10-30 | Discharge: 2018-10-30 | Disposition: A | Discharge status: Home / Self Care | Payer: 59 | Attending: Emergency Medicine | Admitting: Emergency Medicine

## 2018-10-30 ENCOUNTER — Encounter (HOSPITAL_COMMUNITY): Payer: Self-pay | Admitting: Emergency Medicine

## 2018-10-30 ENCOUNTER — Other Ambulatory Visit: Payer: Self-pay

## 2018-10-30 ENCOUNTER — Emergency Department (HOSPITAL_COMMUNITY): Payer: 59

## 2018-10-30 DIAGNOSIS — Z79899 Other long term (current) drug therapy: Secondary | ICD-10-CM | POA: Insufficient documentation

## 2018-10-30 DIAGNOSIS — I252 Old myocardial infarction: Secondary | ICD-10-CM | POA: Insufficient documentation

## 2018-10-30 DIAGNOSIS — I2510 Atherosclerotic heart disease of native coronary artery without angina pectoris: Secondary | ICD-10-CM | POA: Diagnosis not present

## 2018-10-30 DIAGNOSIS — I129 Hypertensive chronic kidney disease with stage 1 through stage 4 chronic kidney disease, or unspecified chronic kidney disease: Secondary | ICD-10-CM | POA: Diagnosis not present

## 2018-10-30 DIAGNOSIS — E1122 Type 2 diabetes mellitus with diabetic chronic kidney disease: Secondary | ICD-10-CM | POA: Diagnosis not present

## 2018-10-30 DIAGNOSIS — E875 Hyperkalemia: Secondary | ICD-10-CM | POA: Insufficient documentation

## 2018-10-30 DIAGNOSIS — Z87891 Personal history of nicotine dependence: Secondary | ICD-10-CM | POA: Insufficient documentation

## 2018-10-30 DIAGNOSIS — M545 Low back pain: Secondary | ICD-10-CM | POA: Diagnosis present

## 2018-10-30 DIAGNOSIS — Z7982 Long term (current) use of aspirin: Secondary | ICD-10-CM | POA: Insufficient documentation

## 2018-10-30 DIAGNOSIS — K8020 Calculus of gallbladder without cholecystitis without obstruction: Secondary | ICD-10-CM

## 2018-10-30 DIAGNOSIS — N184 Chronic kidney disease, stage 4 (severe): Secondary | ICD-10-CM | POA: Insufficient documentation

## 2018-10-30 DIAGNOSIS — R1031 Right lower quadrant pain: Secondary | ICD-10-CM | POA: Diagnosis not present

## 2018-10-30 DIAGNOSIS — R109 Unspecified abdominal pain: Secondary | ICD-10-CM

## 2018-10-30 LAB — CBC WITH DIFFERENTIAL/PLATELET
Abs Immature Granulocytes: 0.03 10*3/uL (ref 0.00–0.07)
BASOS ABS: 0 10*3/uL (ref 0.0–0.1)
Basophils Relative: 0 %
EOS ABS: 0.2 10*3/uL (ref 0.0–0.5)
EOS PCT: 3 %
HCT: 32.8 % — ABNORMAL LOW (ref 36.0–46.0)
HEMOGLOBIN: 10 g/dL — AB (ref 12.0–15.0)
Immature Granulocytes: 0 %
LYMPHS ABS: 2.4 10*3/uL (ref 0.7–4.0)
Lymphocytes Relative: 34 %
MCH: 30.4 pg (ref 26.0–34.0)
MCHC: 30.5 g/dL (ref 30.0–36.0)
MCV: 99.7 fL (ref 80.0–100.0)
MONO ABS: 0.7 10*3/uL (ref 0.1–1.0)
Monocytes Relative: 10 %
NRBC: 0 % (ref 0.0–0.2)
Neutro Abs: 3.7 10*3/uL (ref 1.7–7.7)
Neutrophils Relative %: 53 %
Platelets: 238 10*3/uL (ref 150–400)
RBC: 3.29 MIL/uL — ABNORMAL LOW (ref 3.87–5.11)
RDW: 13.8 % (ref 11.5–15.5)
WBC: 7 10*3/uL (ref 4.0–10.5)

## 2018-10-30 LAB — COMPREHENSIVE METABOLIC PANEL
ALK PHOS: 209 U/L — AB (ref 38–126)
ALT: 18 U/L (ref 0–44)
AST: 12 U/L — AB (ref 15–41)
Albumin: 3.9 g/dL (ref 3.5–5.0)
Anion gap: 6 (ref 5–15)
BUN: 73 mg/dL — AB (ref 8–23)
CALCIUM: 8.7 mg/dL — AB (ref 8.9–10.3)
CO2: 19 mmol/L — ABNORMAL LOW (ref 22–32)
CREATININE: 3.55 mg/dL — AB (ref 0.44–1.00)
Chloride: 116 mmol/L — ABNORMAL HIGH (ref 98–111)
GFR, EST AFRICAN AMERICAN: 13 mL/min — AB (ref >60)
GFR, EST NON AFRICAN AMERICAN: 11 mL/min — AB (ref >60)
Glucose, Bld: 107 mg/dL — ABNORMAL HIGH (ref 70–99)
Potassium: 5.6 mmol/L — ABNORMAL HIGH (ref 3.5–5.1)
Sodium: 141 mmol/L (ref 135–145)
Total Bilirubin: 0.7 mg/dL (ref 0.3–1.2)
Total Protein: 7.7 g/dL (ref 6.5–8.1)

## 2018-10-30 MED ORDER — SODIUM POLYSTYRENE SULFONATE 15 GM/60ML PO SUSP
15.0000 g | Freq: Once | ORAL | Status: AC
  Administered 2018-10-30: 15 g via ORAL
  Filled 2018-10-30: qty 60

## 2018-10-30 MED ORDER — FENTANYL CITRATE (PF) 100 MCG/2ML IJ SOLN
50.0000 ug | Freq: Once | INTRAMUSCULAR | Status: AC
Start: 2018-10-30 — End: 2018-10-30
  Administered 2018-10-30: 50 ug via INTRAVENOUS
  Filled 2018-10-30: qty 2

## 2018-10-30 MED ORDER — SODIUM CHLORIDE 0.9 % IV BOLUS
500.0000 mL | Freq: Once | INTRAVENOUS | Status: AC
  Administered 2018-10-30: 500 mL via INTRAVENOUS

## 2018-10-30 NOTE — ED Provider Notes (Signed)
[facility] EMERGENCY DEPARTMENT Provider Note   CSN: [id number] Arrival date & time: 10/30/18  1719     History   Chief Complaint Chief Complaint  Patient presents with  . Back Pain    HPI Kristen Kaufman is a 84 y.o. female.  HPI Patient presents with right posterior flank pain that goes down into the abdomen.  States covers most of the abdomen but points more in the mid to lower abdomen.  Has been there since yesterday.  Has not been able to sleep from it.  It is constant.  Has had some constipation.  No nausea vomiting.  No dysuria.  States she has had fevers.  Reportedly seen by PCP and sent in for further evaluation.  She has not had pains like this before.  States the pain is severe. Past Medical History:  Diagnosis Date  . Chronic back pain   . Diabetes mellitus without complication ([place])   . Gout   . Hypertension   . Myocardial infarct ([place])   . Ulcer    stomach    Patient Active Problem List   Diagnosis Date Noted  . Rheumatoid arthritis ([place]) 06/12/2018  . Chest pain 06/12/2018  . Gout 02/13/2018  . CAD (coronary artery disease) 02/13/2018  . Constipation 06/13/2017  . AKI (acute kidney injury) ([place]) 05/30/2017  . UTI (urinary tract infection), uncomplicated 40/98/1191  . UTI (urinary tract infection) 05/25/2017  . Hyperkalemia 05/25/2017  . Acute renal failure superimposed on stage 4 chronic kidney disease ([place])   . Anemia in stage 4 chronic kidney disease ([place])   . Renal insufficiency 05/24/2017  . Diabetes mellitus type 2 in obese ([place]) 05/24/2017  . Essential hypertension 05/24/2017    Past Surgical History:  Procedure Laterality Date  . ABDOMINAL HYSTERECTOMY       OB History   None      Home Medications    Prior to Admission medications   Medication Sig Start Date End Date Taking? Authorizing Provider  ACETAMINOPHEN EXTRA STRENGTH 500 MG tablet Take 500 mg by mouth 2 (two) times daily as needed for mild pain or moderate pain.  09/26/18   Yes [provider]  amLODipine (NORVASC) 10 MG tablet Take 10 mg by mouth daily.   Yes [provider]  aspirin EC 81 MG EC tablet Take 1 tablet (81 mg total) by mouth daily. 06/12/18  Yes Johnson, Clanford L, MD  colchicine 0.6 MG tablet Take 1 tablet (0.6 mg total) by mouth daily. Patient taking differently: Take 0.6 mg by mouth daily as needed (for gout).  02/16/18  Yes Kayleen Memos, DO    Family History Family History  Problem Relation Age of Onset  . Chronic Renal Failure Mother 38       required hemodialysis x 6 months  . Hypertension Mother   . Prostate cancer Brother     Social History Social History   Tobacco Use  . Smoking status: Former Smoker    Years: 65.00    Last attempt to quit: 10/05/2006    Years since quitting: 12.0  . Smokeless tobacco: Never Used  Substance Use Topics  . Alcohol use: Yes    Comment: Quit in 2007 - h/o heavy use  . Drug use: No     Allergies   Influenza vaccines and Menactra [meningococcal a c y&w-135 conj]   Review of Systems Review of Systems  Constitutional: Positive for appetite change and fever.  HENT: Negative for congestion.   Respiratory:  Negative for shortness of breath.   Cardiovascular: Negative for chest pain.  Gastrointestinal: Positive for abdominal pain and constipation. Negative for nausea.  Genitourinary: Positive for flank pain.  Musculoskeletal: Positive for back pain.  Skin: Negative for rash.  Neurological: Negative for weakness.  Psychiatric/Behavioral: Negative for confusion.     Physical Exam Updated Vital Signs BP (!) 131/91 (BP Location: Right Arm)   Pulse 80   Temp 97.9 F (36.6 C) (Oral)   Resp 20   Ht 5\' 1"  (1.549 m)   Wt 80.3 kg   SpO2 95%   BMI 33.44 kg/m   Physical Exam  Constitutional: She appears well-developed.  HENT:  Head: Atraumatic.  Eyes: Pupils are equal, round, and reactive to light.  Neck: Neck supple.  Cardiovascular: Normal rate.  Pulmonary/Chest:  She has no wheezes. She has no rales.  Abdominal: There is tenderness.  Moderate diffuse tenderness in the abdomen.  No hernia palpated.  Genitourinary:  Genitourinary Comments: Right sided CVA tenderness.  No rash.  Musculoskeletal: She exhibits no edema.  Neurological: She is alert.  Skin: Skin is warm. Capillary refill takes less than 2 seconds.     ED Treatments / Results  Labs (all labs ordered are listed, but only abnormal results are displayed) Labs Reviewed  COMPREHENSIVE METABOLIC PANEL - Abnormal; Notable for the following components:      Result Value   Potassium 5.6 (*)    Chloride 116 (*)    CO2 19 (*)    Glucose, Bld 107 (*)    BUN 73 (*)    Creatinine, Ser 3.55 (*)    Calcium 8.7 (*)    AST 12 (*)    Alkaline Phosphatase 209 (*)    GFR calc non Af Amer 11 (*)    GFR calc Af Amer 13 (*)    All other components within normal limits  CBC WITH DIFFERENTIAL/PLATELET - Abnormal; Notable for the following components:   RBC 3.29 (*)    Hemoglobin 10.0 (*)    HCT 32.8 (*)    All other components within normal limits  URINALYSIS, ROUTINE W REFLEX MICROSCOPIC    EKG EKG Interpretation  Date/Time:  Tuesday October 30 2018 20:07:15 EST Ventricular Rate:  78 PR Interval:    QRS Duration: 86 QT Interval:  399 QTC Calculation: 455 R Axis:   -35 Text Interpretation:  Sinus rhythm Left axis deviation Low voltage, precordial leads Abnormal R-wave progression, early transition Consider anterior infarct No significant change since last tracing Confirmed by Davonna Belling [id number]) on 10/30/2018 8:12:16 PM   Radiology Ct Abdomen Pelvis Wo Contrast  Result Date: 10/30/2018 CLINICAL DATA:  Right flank pain. EXAM: CT ABDOMEN AND PELVIS WITHOUT CONTRAST TECHNIQUE: Multidetector CT imaging of the abdomen and pelvis was performed following the standard protocol without IV contrast. COMPARISON:  CT scan of May 30, 2017. FINDINGS: Lower chest: No acute abnormality.  Hepatobiliary: Minimal cholelithiasis without inflammation. No biliary dilatation is noted. No focal abnormality seen in the liver on these unenhanced images. Pancreas: Unremarkable. No pancreatic ductal dilatation or surrounding inflammatory changes. Spleen: Normal in size without focal abnormality. Adrenals/Urinary Tract: Adrenal glands are unremarkable. Kidneys are normal, without renal calculi, focal lesion, or hydronephrosis. Bladder is unremarkable. Stomach/Bowel: The stomach appears normal. There is no evidence of bowel obstruction or inflammation. Diverticulosis of descending and sigmoid colon is noted. The appendix is not clearly visualized. Vascular/Lymphatic: Aortic atherosclerosis. No enlarged abdominal or pelvic lymph nodes. Reproductive: Status post hysterectomy. No adnexal masses. Other:  No abdominal wall hernia or abnormality. No abdominopelvic ascites. Musculoskeletal: No acute or significant osseous findings. IMPRESSION: Minimal cholelithiasis without inflammation. Diverticulosis of descending and sigmoid colon without inflammation. No hydronephrosis or renal obstruction is noted. No renal or ureteral calculi are noted. Aortic Atherosclerosis (ICD10-I70.0). Electronically Signed   By: Marijo Conception, M.D.   On: 10/30/2018 20:47   Dg Chest 2 View  Result Date: 10/30/2018 CLINICAL DATA:  Right mid posterior back pain x1 day. EXAM: CHEST - 2 VIEW COMPARISON:  None. FINDINGS: Stable cardiomegaly with tortuous atherosclerotic aorta. Subsegmental atelectasis and/or scarring is seen at each lung base. No overt pulmonary edema, effusion or pneumothorax. No acute osseous abnormality. Osteoarthritis of the glenohumeral joints with tapered appearance of the distal right clavicle potentially resected or which can be seen with inflammatory arthropathies. IMPRESSION: Stable cardiomegaly with aortic atherosclerosis. Bibasilar atelectasis and/or scarring. Electronically Signed   By: Ashley Royalty M.D.   On:  10/30/2018 18:53    Procedures Procedures (including critical care time)  Medications Ordered in ED Medications  sodium chloride 0.9 % bolus 500 mL (0 mLs Intravenous Stopped 10/30/18 1927)  fentaNYL (SUBLIMAZE) injection 50 mcg (50 mcg Intravenous Given 10/30/18 1823)  sodium polystyrene (KAYEXALATE) 15 GM/60ML suspension 15 g (15 g Oral Given 10/30/18 2252)     Initial Impression / Assessment and Plan / ED Course  I have reviewed the triage vital signs and the nursing notes.  Pertinent labs & imaging results that were available during my care of the patient were reviewed by me and considered in my medical decision making (see chart for details).     Patient with right flank and abdominal pain.  Lab work shows a renal insufficiency.  Has a mild hyperkalemia but no EKG changes.  Has had these changes previously.  Will give single dose of Kayexalate.  Patient has been here for 5 hours and still has not been able to give [place] urine.  Patient states she just urinated.  Patient not willing to wait further for the urine sample.  CT scan done and reassuring.  Has had the pain in her lower abdomen.  Does have cholelithiasis without cholecystitis.  Needs outpatient follow-up.  Discharge home.  Final Clinical Impressions(s) / ED Diagnoses   Final diagnoses:  Flank pain  Stage 4 chronic kidney disease ([place])  Hyperkalemia    ED Discharge Orders    None       Davonna Belling, MD 10/30/18 2253

## 2018-10-30 NOTE — ED Triage Notes (Signed)
Pt seen pcp today due to right mid posterior back pain. Pt states pcp pushed on her abd and pt jumped, states pcp wanted  Her to come to ED. Pain x 1 day. Denies n/v/d. Denies gu sx

## 2018-10-30 NOTE — Discharge Instructions (Signed)
Follow with your nephrologist tomorrow.  Your potassium was 5.6 today and your creatinine was 3.5.  You were given 15 g of Kayexalate.

## 2019-03-01 ENCOUNTER — Inpatient Hospital Stay (HOSPITAL_COMMUNITY)
Admission: EM | Admit: 2019-03-01 | Discharge: 2019-03-07 | DRG: 689 | Disposition: A | Discharge status: Home / Self Care | Payer: 59 | Attending: Internal Medicine | Admitting: Internal Medicine

## 2019-03-01 ENCOUNTER — Other Ambulatory Visit: Payer: Self-pay

## 2019-03-01 ENCOUNTER — Emergency Department (HOSPITAL_COMMUNITY): Payer: 59

## 2019-03-01 ENCOUNTER — Encounter (HOSPITAL_COMMUNITY): Payer: Self-pay | Admitting: Emergency Medicine

## 2019-03-01 DIAGNOSIS — Z7982 Long term (current) use of aspirin: Secondary | ICD-10-CM

## 2019-03-01 DIAGNOSIS — R0600 Dyspnea, unspecified: Secondary | ICD-10-CM

## 2019-03-01 DIAGNOSIS — E119 Type 2 diabetes mellitus without complications: Secondary | ICD-10-CM | POA: Diagnosis present

## 2019-03-01 DIAGNOSIS — N179 Acute kidney failure, unspecified: Secondary | ICD-10-CM | POA: Diagnosis not present

## 2019-03-01 DIAGNOSIS — Z887 Allergy status to serum and vaccine status: Secondary | ICD-10-CM

## 2019-03-01 DIAGNOSIS — Z8249 Family history of ischemic heart disease and other diseases of the circulatory system: Secondary | ICD-10-CM

## 2019-03-01 DIAGNOSIS — D649 Anemia, unspecified: Secondary | ICD-10-CM

## 2019-03-01 DIAGNOSIS — Z87891 Personal history of nicotine dependence: Secondary | ICD-10-CM

## 2019-03-01 DIAGNOSIS — J181 Lobar pneumonia, unspecified organism: Secondary | ICD-10-CM

## 2019-03-01 DIAGNOSIS — B9620 Unspecified Escherichia coli [E. coli] as the cause of diseases classified elsewhere: Secondary | ICD-10-CM | POA: Diagnosis present

## 2019-03-01 DIAGNOSIS — Z90710 Acquired absence of both cervix and uterus: Secondary | ICD-10-CM

## 2019-03-01 DIAGNOSIS — N189 Chronic kidney disease, unspecified: Secondary | ICD-10-CM

## 2019-03-01 DIAGNOSIS — R0689 Other abnormalities of breathing: Secondary | ICD-10-CM

## 2019-03-01 DIAGNOSIS — E1169 Type 2 diabetes mellitus with other specified complication: Secondary | ICD-10-CM

## 2019-03-01 DIAGNOSIS — N390 Urinary tract infection, site not specified: Secondary | ICD-10-CM | POA: Diagnosis not present

## 2019-03-01 DIAGNOSIS — Z888 Allergy status to other drugs, medicaments and biological substances status: Secondary | ICD-10-CM

## 2019-03-01 DIAGNOSIS — D631 Anemia in chronic kidney disease: Secondary | ICD-10-CM | POA: Diagnosis present

## 2019-03-01 DIAGNOSIS — E875 Hyperkalemia: Secondary | ICD-10-CM | POA: Diagnosis present

## 2019-03-01 DIAGNOSIS — Z7952 Long term (current) use of systemic steroids: Secondary | ICD-10-CM

## 2019-03-01 DIAGNOSIS — N3090 Cystitis, unspecified without hematuria: Principal | ICD-10-CM | POA: Diagnosis present

## 2019-03-01 DIAGNOSIS — N184 Chronic kidney disease, stage 4 (severe): Secondary | ICD-10-CM | POA: Diagnosis not present

## 2019-03-01 DIAGNOSIS — J189 Pneumonia, unspecified organism: Secondary | ICD-10-CM | POA: Diagnosis present

## 2019-03-01 DIAGNOSIS — M109 Gout, unspecified: Secondary | ICD-10-CM | POA: Diagnosis present

## 2019-03-01 DIAGNOSIS — I10 Essential (primary) hypertension: Secondary | ICD-10-CM | POA: Diagnosis present

## 2019-03-01 DIAGNOSIS — R300 Dysuria: Secondary | ICD-10-CM | POA: Diagnosis not present

## 2019-03-01 DIAGNOSIS — E669 Obesity, unspecified: Secondary | ICD-10-CM | POA: Diagnosis present

## 2019-03-01 DIAGNOSIS — Z79899 Other long term (current) drug therapy: Secondary | ICD-10-CM

## 2019-03-01 DIAGNOSIS — I1310 Hypertensive heart and chronic kidney disease without heart failure, with stage 1 through stage 4 chronic kidney disease, or unspecified chronic kidney disease: Secondary | ICD-10-CM | POA: Diagnosis present

## 2019-03-01 DIAGNOSIS — E1122 Type 2 diabetes mellitus with diabetic chronic kidney disease: Secondary | ICD-10-CM | POA: Diagnosis present

## 2019-03-01 DIAGNOSIS — Z841 Family history of disorders of kidney and ureter: Secondary | ICD-10-CM

## 2019-03-01 DIAGNOSIS — Z6841 Body Mass Index (BMI) 40.0 and over, adult: Secondary | ICD-10-CM

## 2019-03-01 DIAGNOSIS — Z8042 Family history of malignant neoplasm of prostate: Secondary | ICD-10-CM

## 2019-03-01 DIAGNOSIS — I252 Old myocardial infarction: Secondary | ICD-10-CM

## 2019-03-01 DIAGNOSIS — G8929 Other chronic pain: Secondary | ICD-10-CM | POA: Diagnosis present

## 2019-03-01 LAB — COMPREHENSIVE METABOLIC PANEL
ALK PHOS: 129 U/L — AB (ref 38–126)
ALT: 12 U/L (ref 0–44)
AST: 10 U/L — AB (ref 15–41)
Albumin: 3.2 g/dL — ABNORMAL LOW (ref 3.5–5.0)
Anion gap: 5 (ref 5–15)
BUN: 97 mg/dL — AB (ref 8–23)
CHLORIDE: 118 mmol/L — AB (ref 98–111)
CO2: 18 mmol/L — ABNORMAL LOW (ref 22–32)
CREATININE: 4.73 mg/dL — AB (ref 0.44–1.00)
Calcium: 7.9 mg/dL — ABNORMAL LOW (ref 8.9–10.3)
GFR calc Af Amer: 9 mL/min — ABNORMAL LOW (ref >60)
GFR, EST NON AFRICAN AMERICAN: 8 mL/min — AB (ref >60)
Glucose, Bld: 143 mg/dL — ABNORMAL HIGH (ref 70–99)
Potassium: 5.8 mmol/L — ABNORMAL HIGH (ref 3.5–5.1)
Sodium: 141 mmol/L (ref 135–145)
Total Bilirubin: 0.9 mg/dL (ref 0.3–1.2)
Total Protein: 6.8 g/dL (ref 6.5–8.1)

## 2019-03-01 LAB — RETICULOCYTES
Immature Retic Fract: 19.9 % — ABNORMAL HIGH (ref 2.3–15.9)
RBC.: 2.41 MIL/uL — ABNORMAL LOW (ref 3.87–5.11)
Retic Count, Absolute: 101.9 10*3/uL (ref 19.0–186.0)
Retic Ct Pct: 4.2 % — ABNORMAL HIGH (ref 0.4–3.1)

## 2019-03-01 LAB — CBC WITH DIFFERENTIAL/PLATELET
Abs Immature Granulocytes: 0.16 10*3/uL — ABNORMAL HIGH (ref 0.00–0.07)
BASOS ABS: 0 10*3/uL (ref 0.0–0.1)
Basophils Relative: 0 %
Eosinophils Absolute: 0.3 10*3/uL (ref 0.0–0.5)
Eosinophils Relative: 3 %
HEMATOCRIT: 26 % — AB (ref 36.0–46.0)
Hemoglobin: 7.6 g/dL — ABNORMAL LOW (ref 12.0–15.0)
Immature Granulocytes: 2 %
LYMPHS ABS: 2.3 10*3/uL (ref 0.7–4.0)
LYMPHS PCT: 24 %
MCH: 31.1 pg (ref 26.0–34.0)
MCHC: 29.2 g/dL — ABNORMAL LOW (ref 30.0–36.0)
MCV: 106.6 fL — ABNORMAL HIGH (ref 80.0–100.0)
Monocytes Absolute: 0.9 10*3/uL (ref 0.1–1.0)
Monocytes Relative: 9 %
NRBC: 0 % (ref 0.0–0.2)
Neutro Abs: 6.1 10*3/uL (ref 1.7–7.7)
Neutrophils Relative %: 62 %
Platelets: 226 10*3/uL (ref 150–400)
RBC: 2.44 MIL/uL — ABNORMAL LOW (ref 3.87–5.11)
RDW: 13.9 % (ref 11.5–15.5)
WBC: 9.8 10*3/uL (ref 4.0–10.5)

## 2019-03-01 LAB — LACTIC ACID, PLASMA
Lactic Acid, Venous: 1 mmol/L (ref 0.5–1.9)
Lactic Acid, Venous: 0.8 mmol/L (ref 0.5–1.9)

## 2019-03-01 LAB — GLUCOSE, CAPILLARY: Glucose-Capillary: 125 mg/dL — ABNORMAL HIGH (ref 70–99)

## 2019-03-01 LAB — URINALYSIS, ROUTINE W REFLEX MICROSCOPIC
BILIRUBIN URINE: NEGATIVE
Glucose, UA: NEGATIVE mg/dL
Ketones, ur: NEGATIVE mg/dL
NITRITE: NEGATIVE
Protein, ur: 100 mg/dL — AB
Specific Gravity, Urine: 1.011 (ref 1.005–1.030)
pH: 8 (ref 5.0–8.0)

## 2019-03-01 LAB — FERRITIN: Ferritin: 532 ng/mL — ABNORMAL HIGH (ref 11–307)

## 2019-03-01 LAB — IRON AND TIBC
Iron: 95 ug/dL (ref 28–170)
Saturation Ratios: 42 % — ABNORMAL HIGH (ref 10.4–31.8)
TIBC: 225 ug/dL — ABNORMAL LOW (ref 250–450)
UIBC: 130 ug/dL

## 2019-03-01 LAB — LIPASE, BLOOD: LIPASE: 42 U/L (ref 11–51)

## 2019-03-01 LAB — POC OCCULT BLOOD, ED: Fecal Occult Bld: NEGATIVE

## 2019-03-01 LAB — VITAMIN B12: Vitamin B-12: 381 pg/mL (ref 180–914)

## 2019-03-01 LAB — TROPONIN I

## 2019-03-01 LAB — FOLATE: Folate: 5.7 ng/mL — ABNORMAL LOW (ref >5.9)

## 2019-03-01 MED ORDER — PATIROMER SORBITEX CALCIUM 8.4 G PO PACK
8.4000 g | PACK | Freq: Every day | ORAL | Status: DC
  Administered 2019-03-03 – 2019-03-07 (×5): 8.4 g via ORAL
  Filled 2019-03-01 (×7): qty 1

## 2019-03-01 MED ORDER — SODIUM CHLORIDE 0.9 % IV BOLUS
500.0000 mL | Freq: Once | INTRAVENOUS | Status: AC
  Administered 2019-03-01: 500 mL via INTRAVENOUS

## 2019-03-01 MED ORDER — SODIUM CHLORIDE 0.9 % IV SOLN
500.0000 mg | INTRAVENOUS | Status: DC
  Administered 2019-03-01 – 2019-03-03 (×3): 500 mg via INTRAVENOUS
  Filled 2019-03-01 (×3): qty 500

## 2019-03-01 MED ORDER — AMLODIPINE BESYLATE 5 MG PO TABS
10.0000 mg | ORAL_TABLET | Freq: Every day | ORAL | Status: DC
  Administered 2019-03-02 – 2019-03-07 (×6): 10 mg via ORAL
  Filled 2019-03-01 (×6): qty 2

## 2019-03-01 MED ORDER — SODIUM CHLORIDE 0.9 % IV SOLN
1.0000 g | INTRAVENOUS | Status: DC
  Administered 2019-03-01 – 2019-03-03 (×3): 1 g via INTRAVENOUS
  Filled 2019-03-01 (×3): qty 10

## 2019-03-01 MED ORDER — ENOXAPARIN SODIUM 30 MG/0.3ML ~~LOC~~ SOLN
30.0000 mg | SUBCUTANEOUS | Status: DC
  Administered 2019-03-01 – 2019-03-06 (×6): 30 mg via SUBCUTANEOUS
  Filled 2019-03-01 (×5): qty 0.3

## 2019-03-01 MED ORDER — INSULIN ASPART 100 UNIT/ML ~~LOC~~ SOLN: 0.0000 [IU] | Freq: Every day | SUBCUTANEOUS | Status: DC

## 2019-03-01 MED ORDER — ASPIRIN EC 81 MG PO TBEC
81.0000 mg | DELAYED_RELEASE_TABLET | Freq: Every day | ORAL | Status: DC
  Administered 2019-03-02 – 2019-03-07 (×6): 81 mg via ORAL
  Filled 2019-03-01 (×6): qty 1

## 2019-03-01 MED ORDER — INSULIN ASPART 100 UNIT/ML ~~LOC~~ SOLN
0.0000 [IU] | Freq: Three times a day (TID) | SUBCUTANEOUS | Status: DC
  Administered 2019-03-02: 1 [IU] via SUBCUTANEOUS
  Administered 2019-03-03: 2 [IU] via SUBCUTANEOUS
  Administered 2019-03-03: 3 [IU] via SUBCUTANEOUS
  Administered 2019-03-04: 2 [IU] via SUBCUTANEOUS
  Administered 2019-03-04 – 2019-03-05 (×3): 3 [IU] via SUBCUTANEOUS
  Administered 2019-03-05: 2 [IU] via SUBCUTANEOUS
  Administered 2019-03-06: 3 [IU] via SUBCUTANEOUS
  Administered 2019-03-06: 2 [IU] via SUBCUTANEOUS

## 2019-03-01 MED ORDER — SODIUM CHLORIDE 0.9 % IV SOLN
INTRAVENOUS | Status: DC
  Administered 2019-03-01: 23:00:00 via INTRAVENOUS

## 2019-03-01 NOTE — ED Triage Notes (Signed)
Seen by physician in [place] yesterday   For "kidneys"  States she wanted him to give her something for pain but evidently he only offered antibiotics "two pills to take yesterday"  Tylenol at 1400

## 2019-03-01 NOTE — H&P (Signed)
History and Physical  Kristen Kaufman OAC:[id number] DOB: 07/30/1931 DOA: 03/01/2019  Referring physician: Dr Thurnell Garbe, ED physician PCP: Sherrilee Gilles, DO  Outpatient Specialists:   Patient Coming From: home  Chief Complaint: suprapubic pain  HPI: Kristen Kaufman is a 85 y.o. female with a history of diabetes, hypertension, stage IV chronic kidney disease, chronic hyper kalemia, gout.  Patient seen for increasing suprapubic pain over the past couple of weeks but is acutely worse over the past 3 days.  Patient saw her primary care physician who did some blood work and a urine sample.  She was told that she had a bladder infection, but said that no antibiotics have been prescribed.  She denies fevers, chills, nausea, vomiting.  She does have a cough that is productive with yellow phlegm and gradually worsening.  No palliating or provoking factors.  She also notes generalized fatigue, but no dizziness or lightheadedness when she is up and moving around.  Emergency Department Course: Chest x-ray shows subtle pneumonia in the right base.  UA significant for UTI.  Review of Systems:   Pt denies any fevers, chills, nausea, vomiting, diarrhea, constipation, abdominal pain, dyspnea on exertion, orthopnea, wheezing, palpitations, headache, vision changes, lightheadedness, dizziness, melena, rectal bleeding.  Review of systems are otherwise negative  Past Medical History:  Diagnosis Date   Chronic back pain    Diabetes mellitus without complication ([place])    Gout    Hypertension    Myocardial infarct ([place])    Ulcer    stomach   Past Surgical History:  Procedure Laterality Date   ABDOMINAL HYSTERECTOMY     Social History:  reports that she quit smoking about 12 years ago. She quit after 65.00 years of use. She has never used smokeless tobacco. She reports current alcohol use. She reports that she does not use drugs. Patient lives at home  Allergies  Allergen Reactions   Influenza  Vaccines Anaphylaxis   Menactra [Meningococcal A C Y&W-135 Conj]     Family History  Problem Relation Age of Onset   Chronic Renal Failure Mother 26       required hemodialysis x 6 months   Hypertension Mother    Prostate cancer Brother       Prior to Admission medications   Medication Sig Start Date End Date Taking? Authorizing Provider  ACETAMINOPHEN EXTRA STRENGTH 500 MG tablet Take 500 mg by mouth 2 (two) times daily as needed for mild pain or moderate pain.  09/26/18  Yes [provider]  amLODipine (NORVASC) 10 MG tablet Take 10 mg by mouth daily.   Yes [provider]  aspirin EC 81 MG EC tablet Take 1 tablet (81 mg total) by mouth daily. 06/12/18  Yes Johnson, Clanford L, MD  methylPREDNISolone (MEDROL DOSEPAK) 4 MG TBPK tablet Take 1 tablet by mouth daily. Take as package directs 02/18/19  Yes [provider]  colchicine 0.6 MG tablet Take 1 tablet (0.6 mg total) by mouth daily. Patient not taking: Reported on 03/01/2019 02/16/18   Kayleen Memos, DO    Physical Exam: BP (!) 167/63 (BP Location: Right Arm)    Pulse 81    Temp 98.1 F (36.7 C) (Oral)    Resp 19    Ht 5\' 1"  (1.549 m)    Wt 122.9 kg    SpO2 97%    BMI 51.21 kg/m    General: Elderly black female. Awake and alert and oriented x3. No acute cardiopulmonary distress.   HEENT: Normocephalic  atraumatic.  Right and left ears normal in appearance.  Pupils equal, round, reactive to light. Extraocular muscles are intact. Sclerae anicteric and noninjected.  Moist mucosal membranes. No mucosal lesions.   Neck: Neck supple without lymphadenopathy. No carotid bruits. No masses palpated.   Cardiovascular: Regular rate with normal S1-S2 sounds. No murmurs, rubs, gallops auscultated. No JVD.   Respiratory: Good respiratory effort with slight rale in the right base.  No accessory muscle use.  Abdomen: Soft, suprapubic tenderness without rebound or guarding. Nondistended. Active bowel sounds. No masses  or hepatosplenomegaly   Skin: No rashes, lesions, or ulcerations.  Dry, warm to touch. 2+ dorsalis pedis and radial pulses.  Musculoskeletal: No calf or leg pain. All major joints not erythematous nontender.  No upper or lower joint deformation.  Good ROM.  No contractures   Psychiatric: Intact judgment and insight. Pleasant and cooperative.  Neurologic: No focal neurological deficits. Strength is 5/5 and symmetric in upper and lower extremities.  Cranial nerves II through XII are grossly intact.           Labs on Admission: I have personally reviewed following labs and imaging studies  CBC: Recent Labs  Lab 03/01/19 1829  WBC 9.8  NEUTROABS 6.1  HGB 7.6*  HCT 26.0*  MCV 106.6*  PLT 175   Basic Metabolic Panel: Recent Labs  Lab 03/01/19 1829  NA 141  K 5.8*  CL 118*  CO2 18*  GLUCOSE 143*  BUN 97*  CREATININE 4.73*  CALCIUM 7.9*   GFR: Estimated Creatinine Clearance: 10.7 mL/min (A) (by C-G formula based on SCr of 4.73 mg/dL (H)). Liver Function Tests: Recent Labs  Lab 03/01/19 1829  AST 10*  ALT 12  ALKPHOS 129*  BILITOT 0.9  PROT 6.8  ALBUMIN 3.2*   Recent Labs  Lab 03/01/19 1829  LIPASE 42   No results for input(s): AMMONIA in the last 168 hours. Coagulation Profile: No results for input(s): INR, PROTIME in the last 168 hours. Cardiac Enzymes: Recent Labs  Lab 03/01/19 1829  TROPONINI <0.03   BNP (last 3 results) No results for input(s): PROBNP in the last 8760 hours. HbA1C: No results for input(s): HGBA1C in the last 72 hours. CBG: No results for input(s): GLUCAP in the last 168 hours. Lipid Profile: No results for input(s): CHOL, HDL, LDLCALC, TRIG, CHOLHDL, LDLDIRECT in the last 72 hours. Thyroid Function Tests: No results for input(s): TSH, T4TOTAL, FREET4, T3FREE, THYROIDAB in the last 72 hours. Anemia Panel: No results for input(s): VITAMINB12, FOLATE, FERRITIN, TIBC, IRON, RETICCTPCT in the last 72 hours. Urine analysis:      Component Value Date/Time   COLORURINE AMBER (A) 03/01/2019 1829   APPEARANCEUR CLOUDY (A) 03/01/2019 1829   LABSPEC 1.011 03/01/2019 1829   PHURINE 8.0 03/01/2019 1829   GLUCOSEU NEGATIVE 03/01/2019 1829   HGBUR SMALL (A) 03/01/2019 1829   BILIRUBINUR NEGATIVE 03/01/2019 [place] 03/01/2019 1829   PROTEINUR 100 (A) 03/01/2019 1829   NITRITE NEGATIVE 03/01/2019 1829   LEUKOCYTESUR SMALL (A) 03/01/2019 1829   Sepsis Labs: @LABRCNTIP (procalcitonin:4,lacticidven:4) )No results found for this or any previous visit (from the past 240 hour(s)).   Radiological Exams on Admission: Ct Abdomen Pelvis Wo Contrast  Result Date: 03/01/2019 CLINICAL DATA:  Abdominal pain.  History of hysterectomy. EXAM: CT ABDOMEN AND PELVIS WITHOUT CONTRAST TECHNIQUE: Multidetector CT imaging of the abdomen and pelvis was performed following the standard protocol without IV contrast. COMPARISON:  CT abdomen and pelvis October 30, 2018 FINDINGS:  LOWER CHEST: RIGHT middle lobe atelectasis. Included heart size mildly enlarged. Minimal pericardial effusion. HEPATOBILIARY: Punctate layering cholelithiasis without CT findings of acute cholecystitis. Punctate calcific occasion RIGHT lobe of the liver, otherwise negative non-contrast CT liver. PANCREAS: Normal. SPLEEN: Normal. ADRENALS/URINARY TRACT: Kidneys are orthotopic, demonstrating normal size and morphology. No nephrolithiasis, hydronephrosis; limited assessment for renal masses by nonenhanced CT. Stable 2.4 cm homogeneously hypodense benign-appearing cyst upper pole LEFT kidney. The unopacified ureters are normal in course and caliber. Urinary bladder is partially distended and unremarkable. Normal adrenal glands. STOMACH/BOWEL: The stomach, small and large bowel are normal in course and caliber without inflammatory changes, sensitivity decreased by lack of enteric contrast. Mild stool distended rectum. Moderate colonic diverticulosis. Small, normal  appendix. VASCULAR/LYMPHATIC: Aortoiliac vessels are normal in course and caliber. Mild calcific atherosclerosis. No lymphadenopathy by CT size criteria. REPRODUCTIVE: Status post hysterectomy. OTHER: No intraperitoneal free fluid or free air. MUSCULOSKELETAL: Non-acute. Moderate LEFT and mild RIGHT hip osteoarthrosis. Severe L4-5 and L5-S1 degenerative discs. Moderate to severe L4-5 canal stenosis. IMPRESSION: 1. No nephrolithiasis or obstructive uropathy. 2. Diverticulosis and cholelithiasis without acute intra-abdominal/pelvic process. Aortic Atherosclerosis (ICD10-I70.0). Electronically Signed   By: Elon Alas M.D.   On: 03/01/2019 19:46   Dg Chest 2 View  Result Date: 03/01/2019 CLINICAL DATA:  Pain.  Hypertension. EXAM: CHEST - 2 VIEW COMPARISON:  October 30, 2018 FINDINGS: There is scarring in the lower lung zones bilaterally. There is slight increased opacity in the right base compared to most recent study. Heart is enlarged with pulmonary vascularity normal. No adenopathy. There is aortic atherosclerosis. No bone lesions. IMPRESSION: Scarring in each lower lobe. Question subtle pneumonia superimposed on scarring in the right base. There is stable cardiac prominence. Aortic Atherosclerosis (ICD10-I70.0). Electronically Signed   By: Lowella Grip III M.D.   On: 03/01/2019 19:37    EKG: Independently reviewed. Sinus rhythm, left axis deviation, LVH.  No acute ST changes.  Assessment/Plan: Active Problems:   Diabetes mellitus type 2 in obese [facility])   Essential hypertension   Acute renal failure superimposed on stage 4 chronic kidney disease ([facility])   Anemia in stage 4 chronic kidney disease ([facility])   Hyperkalemia   CAP (community acquired pneumonia)   Acute lower UTI    This patient was discussed with the ED physician, including pertinent vitals, physical exam findings, labs, and imaging.  We also discussed care given by the ED provider.  1. Acute lower UTI a. Culture sent   b. Repeat CBC in the morning c. Rocephin 2. Community-acquired pneumonia Antibiotics: Rocephin and azithromycin Robitussin Blood cultures drawn in the emergency department Sputum cultures CBC tomorrow Strep antigen by urine 3. Acute renal failure superimposed on stage IV kidney disease a. 500 mL bolus b. IV fluids c. Encourage p.o. d. Recheck creatinine this evening and tomorrow 4. Anemia in stage IV chronic kidney disease a. Iron studies b. Likely secondary to chronic kidney disease c. May need iron d. As patient not symptomatic, will hold off on transfusion 5. Hyperkalemia a. Veltassa 8.4mg  b. Recheck potassium tonight and tomorrow 6. Diabetes a. Sliding scale insulin 7. Hypertension a. We will continue with amlodipine  DVT prophylaxis: Reduced dose of Lovenox Consultants: None Code Status: Discussed with patient, who desires full code Family Communication: Husband in the room Disposition Plan: Anticipate the patient needing at least 2 days for improvement of renal function and UTI symptoms.  Patient should be able to return home following admission   Truett Mainland, DO

## 2019-03-01 NOTE — ED Provider Notes (Signed)
[facility] EMERGENCY DEPARTMENT Provider Note   CSN: [id number] Arrival date & time: 03/01/19  1733    History   Chief Complaint Chief Complaint  Patient presents with   Dysuria    HPI Kristen Kaufman is a 85 y.o. female.     HPI  Pt was seen at [place]. Per pt and her family, c/o gradual onset and persistence of constant lower abd "pains" for the past 1 week. Has been associated with dysuria. Pt states she was evaluated by her PMD last week and again today, and was told to come to the ED "for my kidneys." Pt states her "kidneys are failing and I don't want to go on dialysis." Pt is unsure if her PMD told her she had a UTI. Denies N/V/D, no fevers, no rash, no CP/SOB, no back/flank pain, no hematuria.   2025:  Pt now reveals that her stools "have been black" intermittently and that she has had a cough for the past 2 weeks.     Past Medical History:  Diagnosis Date   Chronic back pain    Diabetes mellitus without complication ([place])    Gout    Hypertension    Myocardial infarct [facility])    Ulcer    stomach    Patient Active Problem List   Diagnosis Date Noted   Rheumatoid arthritis ([place]) 06/12/2018   Chest pain 06/12/2018   Gout 02/13/2018   CAD (coronary artery disease) 02/13/2018   Constipation 06/13/2017   AKI (acute kidney injury) ([place]) 05/30/2017   UTI (urinary tract infection), uncomplicated 85/46/2703   UTI (urinary tract infection) 05/25/2017   Hyperkalemia 05/25/2017   Acute renal failure superimposed on stage 4 chronic kidney disease ([place])    Anemia in stage 4 chronic kidney disease ([place])    Renal insufficiency 05/24/2017   Diabetes mellitus type 2 in obese ([place]) 05/24/2017   Essential hypertension 05/24/2017    Past Surgical History:  Procedure Laterality Date   ABDOMINAL HYSTERECTOMY       OB History   No obstetric history on file.      Home Medications    Prior to Admission medications   Medication Sig Start Date End Date  Taking? Authorizing Provider  ACETAMINOPHEN EXTRA STRENGTH 500 MG tablet Take 500 mg by mouth 2 (two) times daily as needed for mild pain or moderate pain.  09/26/18   [provider]  amLODipine (NORVASC) 10 MG tablet Take 10 mg by mouth daily.    [provider]  aspirin EC 81 MG EC tablet Take 1 tablet (81 mg total) by mouth daily. 06/12/18   Johnson, Clanford L, MD  colchicine 0.6 MG tablet Take 1 tablet (0.6 mg total) by mouth daily. Patient taking differently: Take 0.6 mg by mouth daily as needed (for gout).  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 ; ; Eyes: Negative for eye pain, redness and discharge. ; ; ENMT: Negative for ear pain, hoarseness, nasal congestion, sinus pressure and sore throat. ; ; Cardiovascular: Negative for chest pain, palpitations, diaphoresis, dyspnea and peripheral edema. ; ; Respiratory: Negative for cough, wheezing and stridor. ; ; Gastrointestinal: +abd pain. Negative for nausea, vomiting, diarrhea, blood in stool, hematemesis, jaundice and rectal bleeding. . ; ; Genitourinary: +dysuria. Negative for flank pain and hematuria. ; ; Musculoskeletal: Negative for back pain and neck pain. Negative for swelling and trauma.; ; Skin:  Negative for pruritus, rash, abrasions, blisters, bruising and skin lesion.; ; Neuro: Negative for headache, lightheadedness and neck stiffness. Negative for weakness, altered level of consciousness, altered mental status, extremity weakness, paresthesias, involuntary movement, seizure and syncope.       Physical Exam Updated Vital Signs BP (!) 131/91 (BP Location: Left Arm)    Pulse 78    Temp 98.2 F (36.8 C) (Oral)    Resp 18    Ht 5\' 1"  (1.549 m)    Wt 122.9 kg    SpO2 95%    BMI 51.21 kg/m   Physical Exam 1810: Physical examination:  Nursing notes reviewed; Vital signs and O2 SAT reviewed;  Constitutional: Well developed, Well nourished, Well hydrated, In no acute distress; Head:  Normocephalic, atraumatic; Eyes: EOMI, PERRL, No scleral icterus; ENMT: Mouth and pharynx normal, Mucous membranes moist; Neck: Supple, Full range of motion, No lymphadenopathy; Cardiovascular: Regular rate and rhythm, No gallop; Respiratory: Breath sounds clear & equal bilaterally, No wheezes.  Speaking full sentences with ease, Normal respiratory effort/excursion; Chest: Nontender, Movement normal; Abdomen: Soft, +LLQ, suprapubic, RLQ tenderness to palp. No rebound or guarding. Nondistended, Normal bowel sounds; Genitourinary: No CVA tenderness; Spine:  No midline CS, TS, LS tenderness.;; Extremities: Peripheral pulses normal, No tenderness, No edema, No calf edema or asymmetry.; Neuro: AA&Ox3, tangential historian. Major CN grossly intact.  Speech clear. No gross focal motor deficits in extremities.; Skin: Color normal, Warm, Dry.   ED Treatments / Results  Labs (all labs ordered are listed, but only abnormal results are displayed)   EKG EKG Interpretation  Date/Time:  Friday March 01 2019 18:17:29 EDT Ventricular Rate:  75 PR Interval:    QRS Duration: 78 QT Interval:  410 QTC Calculation: 458 R Axis:   -21 Text Interpretation:  Sinus rhythm Left axis deviation Low voltage, precordial leads Left  ventricular hypertrophy Baseline wander When compared with ECG of 10/30/2018 No significant change was found Confirmed by Francine Graven [id number]) on 03/01/2019 7:22:05 PM   Radiology   Procedures Procedures (including critical care time)  Medications Ordered in ED Medications - No data to display   Initial Impression / Assessment and Plan / ED Course  I have reviewed the triage vital signs and the nursing notes.  Pertinent labs & imaging results that were available during my care of the patient were reviewed by me and considered in my medical decision making (see chart for details).    MDM Reviewed: previous chart, nursing note and vitals Reviewed previous: labs and ECG Interpretation: labs, ECG, x-ray and CT scan   Results for orders placed or performed during the hospital encounter of 03/01/19  Comprehensive metabolic panel  Result Value Ref Range   Sodium 141  135 - 145 mmol/L   Potassium 5.8 (H) 3.5 - 5.1 mmol/L   Chloride 118 (H) 98 - 111 mmol/L   CO2 18 (L) 22 - 32 mmol/L   Glucose, Bld 143 (H) 70 - 99 mg/dL   BUN 97 (H) 8 - 23 mg/dL   Creatinine, Ser 4.73 (H) 0.44 - 1.00 mg/dL   Calcium 7.9 (L) 8.9 - 10.3 mg/dL   Total Protein 6.8 6.5 - 8.1 g/dL   Albumin 3.2 (L) 3.5 - 5.0 g/dL   AST 10 (L) 15 - 41 U/L   ALT 12 0 - 44 U/L   Alkaline Phosphatase 129 (H) 38 - 126 U/L   Total Bilirubin 0.9 0.3 - 1.2 mg/dL   GFR calc non Af Amer 8 (L) >60 mL/min   GFR calc Af Amer 9 (L) >60 mL/min   Anion gap 5 5 - 15  Lipase, blood  Result Value Ref Range   Lipase 42 11 - 51 U/L  Lactic acid, plasma  Result Value Ref Range   Lactic Acid, Venous 1.0 0.5 - 1.9 mmol/L  Lactic acid, plasma  Result Value Ref Range   Lactic Acid, Venous 0.8 0.5 - 1.9 mmol/L  CBC with Differential  Result Value Ref Range   WBC 9.8 4.0 - 10.5 K/uL   RBC 2.44 (L) 3.87 - 5.11 MIL/uL   Hemoglobin 7.6 (L) 12.0 - 15.0 g/dL   HCT 26.0 (L) 36.0 - 46.0 %   MCV 106.6 (H) 80.0 - 100.0 fL   MCH 31.1 26.0  - 34.0 pg   MCHC 29.2 (L) 30.0 - 36.0 g/dL   RDW 13.9 11.5 - 15.5 %   Platelets 226 150 - 400 K/uL   nRBC 0.0 0.0 - 0.2 %   Neutrophils Relative % 62 %   Neutro Abs 6.1 1.7 - 7.7 K/uL   Lymphocytes Relative 24 %   Lymphs Abs 2.3 0.7 - 4.0 K/uL   Monocytes Relative 9 %   Monocytes Absolute 0.9 0.1 - 1.0 K/uL   Eosinophils Relative 3 %   Eosinophils Absolute 0.3 0.0 - 0.5 K/uL   Basophils Relative 0 %   Basophils Absolute 0.0 0.0 - 0.1 K/uL   Immature Granulocytes 2 %   Abs Immature Granulocytes 0.16 (H) 0.00 - 0.07 K/uL  Urinalysis, Routine w reflex microscopic  Result Value Ref Range   Color, Urine AMBER (A) YELLOW   APPearance CLOUDY (A) CLEAR   Specific Gravity, Urine 1.011 1.005 - 1.030   pH 8.0 5.0 - 8.0   Glucose, UA NEGATIVE NEGATIVE mg/dL   Hgb urine dipstick SMALL (A) NEGATIVE   Bilirubin Urine NEGATIVE NEGATIVE   Ketones, ur NEGATIVE NEGATIVE mg/dL   Protein, ur 100 (A) NEGATIVE mg/dL   Nitrite NEGATIVE NEGATIVE   Leukocytes,Ua SMALL (A) NEGATIVE   RBC / HPF 0-5 0 - 5 RBC/hpf   WBC, UA 11-20 0 - 5 WBC/hpf   Bacteria, UA FEW (A) NONE SEEN   Squamous Epithelial / LPF 0-5 0 - 5  Troponin I - Once  Result Value Ref Range   Troponin I <0.03 <0.03 ng/mL   Ct Abdomen Pelvis Wo Contrast Result Date: 03/01/2019 CLINICAL DATA:  Abdominal pain.  History of hysterectomy. EXAM: CT ABDOMEN AND PELVIS WITHOUT CONTRAST TECHNIQUE: Multidetector CT imaging of the abdomen and pelvis was performed following the standard protocol without IV contrast. COMPARISON:  CT abdomen and pelvis October 30, 2018 FINDINGS: LOWER CHEST: RIGHT middle lobe atelectasis. Included heart size mildly enlarged.  Minimal pericardial effusion. HEPATOBILIARY: Punctate layering cholelithiasis without CT findings of acute cholecystitis. Punctate calcific occasion RIGHT lobe of the liver, otherwise negative non-contrast CT liver. PANCREAS: Normal. SPLEEN: Normal. ADRENALS/URINARY TRACT: Kidneys are orthotopic,  demonstrating normal size and morphology. No nephrolithiasis, hydronephrosis; limited assessment for renal masses by nonenhanced CT. Stable 2.4 cm homogeneously hypodense benign-appearing cyst upper pole LEFT kidney. The unopacified ureters are normal in course and caliber. Urinary bladder is partially distended and unremarkable. Normal adrenal glands. STOMACH/BOWEL: The stomach, small and large bowel are normal in course and caliber without inflammatory changes, sensitivity decreased by lack of enteric contrast. Mild stool distended rectum. Moderate colonic diverticulosis. Small, normal appendix. VASCULAR/LYMPHATIC: Aortoiliac vessels are normal in course and caliber. Mild calcific atherosclerosis. No lymphadenopathy by CT size criteria. REPRODUCTIVE: Status post hysterectomy. OTHER: No intraperitoneal free fluid or free air. MUSCULOSKELETAL: Non-acute. Moderate LEFT and mild RIGHT hip osteoarthrosis. Severe L4-5 and L5-S1 degenerative discs. Moderate to severe L4-5 canal stenosis. IMPRESSION: 1. No nephrolithiasis or obstructive uropathy. 2. Diverticulosis and cholelithiasis without acute intra-abdominal/pelvic process. Aortic Atherosclerosis (ICD10-I70.0). Electronically Signed   By: Elon Alas M.D.   On: 03/01/2019 19:46   Dg Chest 2 View Result Date: 03/01/2019 CLINICAL DATA:  Pain.  Hypertension. EXAM: CHEST - 2 VIEW COMPARISON:  October 30, 2018 FINDINGS: There is scarring in the lower lung zones bilaterally. There is slight increased opacity in the right base compared to most recent study. Heart is enlarged with pulmonary vascularity normal. No adenopathy. There is aortic atherosclerosis. No bone lesions. IMPRESSION: Scarring in each lower lobe. Question subtle pneumonia superimposed on scarring in the right base. There is stable cardiac prominence. Aortic Atherosclerosis (ICD10-I70.0). Electronically Signed   By: Lowella Grip III M.D.   On: 03/01/2019 19:37    Results for ZAKARIAH, DEJARNETTE  (MRN [id number]) as of 03/01/2019 20:14  Ref. Range 02/14/2018 06:18 06/11/2018 20:59 06/12/2018 07:18 10/30/2018 18:02 03/01/2019 18:29  Hemoglobin Latest Ref Range: 12.0 - 15.0 g/dL 9.0 (L) 9.8 (L) 9.0 (L) 10.0 (L) 7.6 (L)  HCT Latest Ref Range: 36.0 - 46.0 % 29.5 (L) 31.4 (L) 29.9 (L) 32.8 (L) 26.0 (L)    Results for NGA, RABON (MRN [id number]) as of 03/01/2019 20:14  Ref. Range 06/12/2018 07:18 10/30/2018 18:02 03/01/2019 18:29  BUN Latest Ref Range: 8 - 23 mg/dL 59 (H) 73 (H) 97 (H)  Creatinine Latest Ref Range: 0.44 - 1.00 mg/dL 3.46 (H) 3.55 (H) 4.73 (H)    2030:  As I was reviewing her testing results with her, pt now reveals she has had a cough and intermittent "black stools" for the past 2 weeks. Rectal exam performed w/permission of pt and ED RN chaperone present:  Anal tone normal.  Non-tender, soft light brown stool in rectal vault, heme neg.  No fissures, no external hemorrhoids, no palp masses. AKI superimposed on CKD; judicious IVF given and Pharmacy consulted for IV abx dosing for UTI and CAP. No acute EKG changes. BP stable.   2030:  T/C returned from [facility] Nehemiah Settle, case discussed, including:  HPI, pertinent PM/SHx, VS/PE, dx testing, ED course and treatment:  Agreeable to admit.       Final Clinical Impressions(s) / ED Diagnoses   Final diagnoses:  None    ED Discharge Orders    None       Francine Graven, DO 03/04/19 2114

## 2019-03-01 NOTE — Progress Notes (Addendum)
Pharmacy Antibiotic Note  Kristen Kaufman is a 85 y.o. female admitted on 03/01/2019 with UTI./CAP  Pharmacy has been consulted for ceftriaxone and azithromycin  dosing.  Plan: Ceftriaxone 1gm IV q24h Azithromycin 500mg  IV q24h x 5 days (transition to po as tolerated) F/U cx and clinical progress Monitor V/s and labs  Height: 5\' 1"  (154.9 cm) Weight: 271 lb (122.9 kg) IBW/kg (Calculated) : 47.8  Temp (24hrs), Avg:98.2 F (36.8 C), Min:98.2 F (36.8 C), Max:98.2 F (36.8 C)  Recent Labs  Lab 03/01/19 1829 03/01/19 1830  WBC 9.8  --   CREATININE 4.73*  --   LATICACIDVEN  --  1.0    Estimated Creatinine Clearance: 10.7 mL/min (A) (by C-G formula based on SCr of 4.73 mg/dL (H)).    Allergies  Allergen Reactions  . Influenza Vaccines Anaphylaxis  . Menactra [Meningococcal A C Y&W-135 Conj]     Antimicrobials this admission: Ceftriaxone 3/13 >>  Azithromycin 3/13  Dose adjustments this admission: n/a  Microbiology results: 3/13 UCx: pending  Thank you for allowing pharmacy to be a part of this patient's care.  Isac Sarna, BS [place], [place] Clinical Pharmacist Pager [phone] 03/01/2019 8:29 PM

## 2019-03-02 DIAGNOSIS — N390 Urinary tract infection, site not specified: Secondary | ICD-10-CM | POA: Diagnosis not present

## 2019-03-02 DIAGNOSIS — J181 Lobar pneumonia, unspecified organism: Secondary | ICD-10-CM | POA: Diagnosis not present

## 2019-03-02 LAB — BASIC METABOLIC PANEL WITH GFR
Anion gap: 6 (ref 5–15)
Anion gap: 4 — ABNORMAL LOW (ref 5–15)
BUN: 92 mg/dL — ABNORMAL HIGH (ref 8–23)
BUN: 90 mg/dL — ABNORMAL HIGH (ref 8–23)
CO2: 17 mmol/L — ABNORMAL LOW (ref 22–32)
CO2: 17 mmol/L — ABNORMAL LOW (ref 22–32)
Calcium: 7.7 mg/dL — ABNORMAL LOW (ref 8.9–10.3)
Calcium: 7.7 mg/dL — ABNORMAL LOW (ref 8.9–10.3)
Chloride: 118 mmol/L — ABNORMAL HIGH (ref 98–111)
Chloride: 118 mmol/L — ABNORMAL HIGH (ref 98–111)
Creatinine, Ser: 4.51 mg/dL — ABNORMAL HIGH (ref 0.44–1.00)
Creatinine, Ser: 4.19 mg/dL — ABNORMAL HIGH (ref 0.44–1.00)
GFR calc Af Amer: 10 mL/min — ABNORMAL LOW
GFR calc Af Amer: 11 mL/min — ABNORMAL LOW
GFR calc non Af Amer: 8 mL/min — ABNORMAL LOW
GFR calc non Af Amer: 9 mL/min — ABNORMAL LOW
Glucose, Bld: 108 mg/dL — ABNORMAL HIGH (ref 70–99)
Glucose, Bld: 93 mg/dL (ref 70–99)
Potassium: 5.5 mmol/L — ABNORMAL HIGH (ref 3.5–5.1)
Potassium: 5.5 mmol/L — ABNORMAL HIGH (ref 3.5–5.1)
Sodium: 141 mmol/L (ref 135–145)
Sodium: 139 mmol/L (ref 135–145)

## 2019-03-02 LAB — CBC
HCT: 25.2 % — ABNORMAL LOW (ref 36.0–46.0)
Hemoglobin: 7.4 g/dL — ABNORMAL LOW (ref 12.0–15.0)
MCH: 31.1 pg (ref 26.0–34.0)
MCHC: 29.4 g/dL — ABNORMAL LOW (ref 30.0–36.0)
MCV: 105.9 fL — ABNORMAL HIGH (ref 80.0–100.0)
Platelets: 228 10*3/uL (ref 150–400)
RBC: 2.38 MIL/uL — ABNORMAL LOW (ref 3.87–5.11)
RDW: 13.8 % (ref 11.5–15.5)
WBC: 10.3 10*3/uL (ref 4.0–10.5)
nRBC: 0 % (ref 0.0–0.2)

## 2019-03-02 LAB — GLUCOSE, CAPILLARY
Glucose-Capillary: 84 mg/dL (ref 70–99)
Glucose-Capillary: 121 mg/dL — ABNORMAL HIGH (ref 70–99)
Glucose-Capillary: 98 mg/dL (ref 70–99)
Glucose-Capillary: 133 mg/dL — ABNORMAL HIGH (ref 70–99)

## 2019-03-02 LAB — HEMOGLOBIN A1C
Hgb A1c MFr Bld: 5.6 % (ref 4.8–5.6)
Mean Plasma Glucose: 114.02 mg/dL

## 2019-03-02 MED ORDER — SODIUM BICARBONATE 650 MG PO TABS
650.0000 mg | ORAL_TABLET | Freq: Three times a day (TID) | ORAL | Status: DC
  Administered 2019-03-02 – 2019-03-07 (×16): 650 mg via ORAL
  Filled 2019-03-02 (×16): qty 1

## 2019-03-02 MED ORDER — SODIUM CHLORIDE 0.45 % IV SOLN
INTRAVENOUS | Status: DC
  Administered 2019-03-02: 11:00:00 via INTRAVENOUS

## 2019-03-02 MED ORDER — SODIUM POLYSTYRENE SULFONATE 15 GM/60ML PO SUSP
30.0000 g | Freq: Once | ORAL | Status: AC
  Administered 2019-03-02: 30 g via ORAL
  Filled 2019-03-02: qty 120

## 2019-03-02 MED ORDER — DARBEPOETIN ALFA 60 MCG/0.3ML IJ SOSY
60.0000 ug | PREFILLED_SYRINGE | Freq: Once | INTRAMUSCULAR | Status: AC
  Administered 2019-03-02: 60 ug via SUBCUTANEOUS
  Filled 2019-03-02: qty 0.3

## 2019-03-02 MED ORDER — SODIUM CHLORIDE 0.45 % IV SOLN
INTRAVENOUS | Status: AC
  Administered 2019-03-02: 12:00:00 via INTRAVENOUS

## 2019-03-02 NOTE — Progress Notes (Signed)
PROGRESS NOTE    Kristen Kaufman  ZYS:[id number] DOB: [birth year] DOA: 03/01/2019 PCP: Sherrilee Gilles, DO   Brief Narrative:  Per HPI: Kristen Kaufman is a 85 y.o. female with a history of diabetes, hypertension, stage IV chronic kidney disease, chronic hyper kalemia, gout.  Patient seen for increasing suprapubic pain over the past couple of weeks but is acutely worse over the past 3 days.  Patient saw her primary care physician who did some blood work and a urine sample.  She was told that she had a bladder infection, but said that no antibiotics have been prescribed.  She denies fevers, chills, nausea, vomiting.  She does have a cough that is productive with yellow phlegm and gradually worsening.  No palliating or provoking factors.  She also notes generalized fatigue, but no dizziness or lightheadedness when she is up and moving around.  Patient has been admitted with right lobar pneumonia as well as UTI in the setting of AKI on CKD stage IV.  Patient is also hyperkalemic and states that she has not interested in hemodialysis.  She does follow with a nephrologist in [place], [place].  Assessment & Plan:   Active Problems:   Diabetes mellitus type 2 in obese [facility])   Essential hypertension   Acute renal failure superimposed on stage 4 chronic kidney disease (H[facility])   Anemia in stage 4 chronic kidney disease ([facility])   Hyperkalemia   CAP (community acquired pneumonia)   Acute lower UTI  1. Acute lower UTI a. Culture pending b. Repeat CBC in the morning c. Rocephin 2. Community-acquired pneumonia a. Antibiotics: Rocephin and azithromycin b. Robitussin c. Blood cultures drawn in the emergency department d. Sputum cultures e. CBC tomorrow f. Strep antigen by urine pending 3. Acute renal failure superimposed on stage IV kidney disease-improving a. Change to 1/2NS b. Add sodium bicarb tid c. UO adequate with no hypervolemia; will avoid Nephrology evaluation as patient is not interested in  HD 4. Anemia in stage IV chronic kidney disease-stable a. Iron studies-with no deficiency b. Likely secondary to chronic kidney disease c. Will give dose of darbopoetin today d. Recheck CBC in am 5. Hyperkalemia-persistent a. Kayexelate today as this is persistent b. Recheck in am 6. Diabetes-controlled a. Sliding scale insulin 7. Hypertension-stable a. We will continue with amlodipine    DVT prophylaxis: Lovenox Code Status: Full Family Communication: None at bedside Disposition Plan: Continue treatment with azithromycin and Rocephin for UTI and pneumonia.  Await urine cultures.  Continue treatment of AKI with IV fluid as well as additional sodium bicarbonate.  Will follow a.m. labs and add Kayexalate for hyperkalemia as well.   Consultants:   None  Procedures:   None  Antimicrobials:   Rocephin and azithromycin 3/13->   Subjective: Patient seen and evaluated today with no new acute complaints or concerns. No acute concerns or events noted overnight.  Objective: Vitals:   03/01/19 2218 03/01/19 2222 03/02/19 0704 03/02/19 0845  BP: (!) 144/94  (!) 145/68   Pulse: 79  76   Resp:   18   Temp: 97.8 F (36.6 C)  98.2 F (36.8 C)   TempSrc: Oral  Oral   SpO2: 92% 92% 100% 97%  Weight: 85.1 kg     Height: 5\' 1"  (1.549 m)       Intake/Output Summary (Last 24 hours) at 03/02/2019 0956 Last data filed at 03/02/2019 0900 Gross per 24 hour  Intake 1072.74 ml  Output 1013 ml  Net 59.74 ml   Danley Danker  Weights   03/01/19 1746 03/01/19 2218  Weight: 122.9 kg 85.1 kg    Examination:  General exam: Appears calm and comfortable  Respiratory system: Clear to auscultation. Respiratory effort normal. Cardiovascular system: S1 & S2 heard, RRR. No JVD, murmurs, rubs, gallops or clicks. No pedal edema. Gastrointestinal system: Abdomen is nondistended, soft and nontender. No organomegaly or masses felt. Normal bowel sounds heard. Central nervous system: Alert and oriented. No  focal neurological deficits. Extremities: Symmetric 5 x 5 power. Skin: No rashes, lesions or ulcers Psychiatry: Judgement and insight appear normal. Mood & affect appropriate.     Data Reviewed: I have personally reviewed following labs and imaging studies  CBC: Recent Labs  Lab 03/01/19 1829 03/02/19 0652  WBC 9.8 10.3  NEUTROABS 6.1  --   HGB 7.6* 7.4*  HCT 26.0* 25.2*  MCV 106.6* 105.9*  PLT 226 093   Basic Metabolic Panel: Recent Labs  Lab 03/01/19 1829 03/02/19 0027 03/02/19 0652  NA 141 141 139  K 5.8* 5.5* 5.5*  CL 118* 118* 118*  CO2 18* 17* 17*  GLUCOSE 143* 108* 93  BUN 97* 92* 90*  CREATININE 4.73* 4.51* 4.19*  CALCIUM 7.9* 7.7* 7.7*   GFR: Estimated Creatinine Clearance: 9.7 mL/min (A) (by C-G formula based on SCr of 4.19 mg/dL (H)). Liver Function Tests: Recent Labs  Lab 03/01/19 1829  AST 10*  ALT 12  ALKPHOS 129*  BILITOT 0.9  PROT 6.8  ALBUMIN 3.2*   Recent Labs  Lab 03/01/19 1829  LIPASE 42   No results for input(s): AMMONIA in the last 168 hours. Coagulation Profile: No results for input(s): INR, PROTIME in the last 168 hours. Cardiac Enzymes: Recent Labs  Lab 03/01/19 1829  TROPONINI <0.03   BNP (last 3 results) No results for input(s): PROBNP in the last 8760 hours. HbA1C: No results for input(s): HGBA1C in the last 72 hours. CBG: Recent Labs  Lab 03/01/19 2258 03/02/19 0824  GLUCAP 125* 84   Lipid Profile: No results for input(s): CHOL, HDL, LDLCALC, TRIG, CHOLHDL, LDLDIRECT in the last 72 hours. Thyroid Function Tests: No results for input(s): TSH, T4TOTAL, FREET4, T3FREE, THYROIDAB in the last 72 hours. Anemia Panel: Recent Labs    03/01/19 1829 03/01/19 2000  VITAMINB12 381  --   FOLATE  --  5.7*  FERRITIN 532*  --   TIBC 225*  --   IRON 95  --   RETICCTPCT 4.2*  --    Sepsis Labs: Recent Labs  Lab 03/01/19 1830 03/01/19 2000  LATICACIDVEN 1.0 0.8    No results found for this or any previous  visit (from the past 240 hour(s)).       Radiology Studies: Ct Abdomen Pelvis Wo Contrast  Result Date: 03/01/2019 CLINICAL DATA:  Abdominal pain.  History of hysterectomy. EXAM: CT ABDOMEN AND PELVIS WITHOUT CONTRAST TECHNIQUE: Multidetector CT imaging of the abdomen and pelvis was performed following the standard protocol without IV contrast. COMPARISON:  CT abdomen and pelvis October 30, 2018 FINDINGS: LOWER CHEST: RIGHT middle lobe atelectasis. Included heart size mildly enlarged. Minimal pericardial effusion. HEPATOBILIARY: Punctate layering cholelithiasis without CT findings of acute cholecystitis. Punctate calcific occasion RIGHT lobe of the liver, otherwise negative non-contrast CT liver. PANCREAS: Normal. SPLEEN: Normal. ADRENALS/URINARY TRACT: Kidneys are orthotopic, demonstrating normal size and morphology. No nephrolithiasis, hydronephrosis; limited assessment for renal masses by nonenhanced CT. Stable 2.4 cm homogeneously hypodense benign-appearing cyst upper pole LEFT kidney. The unopacified ureters are normal in course and caliber. Urinary  bladder is partially distended and unremarkable. Normal adrenal glands. STOMACH/BOWEL: The stomach, small and large bowel are normal in course and caliber without inflammatory changes, sensitivity decreased by lack of enteric contrast. Mild stool distended rectum. Moderate colonic diverticulosis. Small, normal appendix. VASCULAR/LYMPHATIC: Aortoiliac vessels are normal in course and caliber. Mild calcific atherosclerosis. No lymphadenopathy by CT size criteria. REPRODUCTIVE: Status post hysterectomy. OTHER: No intraperitoneal free fluid or free air. MUSCULOSKELETAL: Non-acute. Moderate LEFT and mild RIGHT hip osteoarthrosis. Severe L4-5 and L5-S1 degenerative discs. Moderate to severe L4-5 canal stenosis. IMPRESSION: 1. No nephrolithiasis or obstructive uropathy. 2. Diverticulosis and cholelithiasis without acute intra-abdominal/pelvic process. Aortic  Atherosclerosis (ICD10-I70.0). Electronically Signed   By: Elon Alas M.D.   On: 03/01/2019 19:46   Dg Chest 2 View  Result Date: 03/01/2019 CLINICAL DATA:  Pain.  Hypertension. EXAM: CHEST - 2 VIEW COMPARISON:  October 30, 2018 FINDINGS: There is scarring in the lower lung zones bilaterally. There is slight increased opacity in the right base compared to most recent study. Heart is enlarged with pulmonary vascularity normal. No adenopathy. There is aortic atherosclerosis. No bone lesions. IMPRESSION: Scarring in each lower lobe. Question subtle pneumonia superimposed on scarring in the right base. There is stable cardiac prominence. Aortic Atherosclerosis (ICD10-I70.0). Electronically Signed   By: Lowella Grip III M.D.   On: 03/01/2019 19:37        Scheduled Meds:  amLODipine  10 mg Oral Daily   aspirin EC  81 mg Oral Daily   enoxaparin (LOVENOX) injection  30 mg Subcutaneous Q24H   insulin aspart  0-15 Units Subcutaneous TID WC   insulin aspart  0-5 Units Subcutaneous QHS   patiromer  8.4 g Oral Daily   sodium bicarbonate  650 mg Oral TID   sodium polystyrene  30 g Oral Once   Continuous Infusions:  sodium chloride     azithromycin Stopped (03/02/19 0701)   cefTRIAXone (ROCEPHIN)  IV Stopped (03/01/19 2136)     LOS: 0 days    Time spent: 30 minutes    Shannara Winbush Darleen Crocker, DO [facility] Pager [phone]  If 7PM-7AM, please contact night-coverage [email or web address] Password [facility] 03/02/2019, 9:56 AM

## 2019-03-03 ENCOUNTER — Observation Stay (HOSPITAL_COMMUNITY): Payer: 59

## 2019-03-03 DIAGNOSIS — Z888 Allergy status to other drugs, medicaments and biological substances status: Secondary | ICD-10-CM | POA: Diagnosis not present

## 2019-03-03 DIAGNOSIS — N3090 Cystitis, unspecified without hematuria: Secondary | ICD-10-CM | POA: Diagnosis present

## 2019-03-03 DIAGNOSIS — N184 Chronic kidney disease, stage 4 (severe): Secondary | ICD-10-CM | POA: Diagnosis present

## 2019-03-03 DIAGNOSIS — J181 Lobar pneumonia, unspecified organism: Secondary | ICD-10-CM | POA: Diagnosis present

## 2019-03-03 DIAGNOSIS — Z7982 Long term (current) use of aspirin: Secondary | ICD-10-CM | POA: Diagnosis not present

## 2019-03-03 DIAGNOSIS — N179 Acute kidney failure, unspecified: Secondary | ICD-10-CM | POA: Diagnosis present

## 2019-03-03 DIAGNOSIS — Z887 Allergy status to serum and vaccine status: Secondary | ICD-10-CM | POA: Diagnosis not present

## 2019-03-03 DIAGNOSIS — R300 Dysuria: Secondary | ICD-10-CM | POA: Diagnosis present

## 2019-03-03 DIAGNOSIS — D631 Anemia in chronic kidney disease: Secondary | ICD-10-CM | POA: Diagnosis present

## 2019-03-03 DIAGNOSIS — Z841 Family history of disorders of kidney and ureter: Secondary | ICD-10-CM | POA: Diagnosis not present

## 2019-03-03 DIAGNOSIS — I1310 Hypertensive heart and chronic kidney disease without heart failure, with stage 1 through stage 4 chronic kidney disease, or unspecified chronic kidney disease: Secondary | ICD-10-CM | POA: Diagnosis present

## 2019-03-03 DIAGNOSIS — Z6841 Body Mass Index (BMI) 40.0 and over, adult: Secondary | ICD-10-CM | POA: Diagnosis not present

## 2019-03-03 DIAGNOSIS — E875 Hyperkalemia: Secondary | ICD-10-CM | POA: Diagnosis present

## 2019-03-03 DIAGNOSIS — G8929 Other chronic pain: Secondary | ICD-10-CM | POA: Diagnosis present

## 2019-03-03 DIAGNOSIS — Z79899 Other long term (current) drug therapy: Secondary | ICD-10-CM | POA: Diagnosis not present

## 2019-03-03 DIAGNOSIS — J189 Pneumonia, unspecified organism: Secondary | ICD-10-CM | POA: Diagnosis not present

## 2019-03-03 DIAGNOSIS — I252 Old myocardial infarction: Secondary | ICD-10-CM | POA: Diagnosis not present

## 2019-03-03 DIAGNOSIS — N390 Urinary tract infection, site not specified: Secondary | ICD-10-CM | POA: Diagnosis not present

## 2019-03-03 DIAGNOSIS — Z90710 Acquired absence of both cervix and uterus: Secondary | ICD-10-CM | POA: Diagnosis not present

## 2019-03-03 DIAGNOSIS — E669 Obesity, unspecified: Secondary | ICD-10-CM | POA: Diagnosis present

## 2019-03-03 DIAGNOSIS — Z8042 Family history of malignant neoplasm of prostate: Secondary | ICD-10-CM | POA: Diagnosis not present

## 2019-03-03 DIAGNOSIS — Z87891 Personal history of nicotine dependence: Secondary | ICD-10-CM | POA: Diagnosis not present

## 2019-03-03 DIAGNOSIS — E1169 Type 2 diabetes mellitus with other specified complication: Secondary | ICD-10-CM | POA: Diagnosis not present

## 2019-03-03 DIAGNOSIS — M109 Gout, unspecified: Secondary | ICD-10-CM | POA: Diagnosis present

## 2019-03-03 DIAGNOSIS — B9620 Unspecified Escherichia coli [E. coli] as the cause of diseases classified elsewhere: Secondary | ICD-10-CM | POA: Diagnosis present

## 2019-03-03 DIAGNOSIS — Z7952 Long term (current) use of systemic steroids: Secondary | ICD-10-CM | POA: Diagnosis not present

## 2019-03-03 DIAGNOSIS — Z8249 Family history of ischemic heart disease and other diseases of the circulatory system: Secondary | ICD-10-CM | POA: Diagnosis not present

## 2019-03-03 DIAGNOSIS — E1122 Type 2 diabetes mellitus with diabetic chronic kidney disease: Secondary | ICD-10-CM | POA: Diagnosis present

## 2019-03-03 LAB — BLOOD GAS, ARTERIAL
Acid-base deficit: 4.4 mmol/L — ABNORMAL HIGH (ref 0.0–2.0)
Bicarbonate: 20.8 mmol/L (ref 20.0–28.0)
FIO2: 21
O2 Saturation: 92.7 %
Patient temperature: 37
pCO2 arterial: 31.7 mmHg — ABNORMAL LOW (ref 32.0–48.0)
pH, Arterial: 7.405 (ref 7.350–7.450)
pO2, Arterial: 65.3 mmHg — ABNORMAL LOW (ref 83.0–108.0)

## 2019-03-03 LAB — GLUCOSE, CAPILLARY
Glucose-Capillary: 150 mg/dL — ABNORMAL HIGH (ref 70–99)
Glucose-Capillary: 152 mg/dL — ABNORMAL HIGH (ref 70–99)
Glucose-Capillary: 82 mg/dL (ref 70–99)
Glucose-Capillary: 159 mg/dL — ABNORMAL HIGH (ref 70–99)

## 2019-03-03 LAB — CBC
HCT: 24.2 % — ABNORMAL LOW (ref 36.0–46.0)
HEMOGLOBIN: 7.1 g/dL — AB (ref 12.0–15.0)
MCH: 31.1 pg (ref 26.0–34.0)
MCHC: 29.3 g/dL — ABNORMAL LOW (ref 30.0–36.0)
MCV: 106.1 fL — ABNORMAL HIGH (ref 80.0–100.0)
Platelets: 230 10*3/uL (ref 150–400)
RBC: 2.28 MIL/uL — AB (ref 3.87–5.11)
RDW: 14 % (ref 11.5–15.5)
WBC: 13.6 10*3/uL — ABNORMAL HIGH (ref 4.0–10.5)
nRBC: 0 % (ref 0.0–0.2)

## 2019-03-03 LAB — BASIC METABOLIC PANEL
Anion gap: 8 (ref 5–15)
BUN: 81 mg/dL — ABNORMAL HIGH (ref 8–23)
CHLORIDE: 111 mmol/L (ref 98–111)
CO2: 18 mmol/L — ABNORMAL LOW (ref 22–32)
Calcium: 7.8 mg/dL — ABNORMAL LOW (ref 8.9–10.3)
Creatinine, Ser: 3.86 mg/dL — ABNORMAL HIGH (ref 0.44–1.00)
GFR calc Af Amer: 12 mL/min — ABNORMAL LOW (ref >60)
GFR calc non Af Amer: 10 mL/min — ABNORMAL LOW (ref >60)
Glucose, Bld: 156 mg/dL — ABNORMAL HIGH (ref 70–99)
Potassium: 4.6 mmol/L (ref 3.5–5.1)
Sodium: 137 mmol/L (ref 135–145)

## 2019-03-03 MED ORDER — HYDRALAZINE HCL 20 MG/ML IJ SOLN
10.0000 mg | INTRAMUSCULAR | Status: DC | PRN
  Administered 2019-03-03: 10 mg via INTRAVENOUS
  Filled 2019-03-03: qty 1

## 2019-03-03 MED ORDER — HALOPERIDOL LACTATE 5 MG/ML IJ SOLN
1.0000 mg | Freq: Four times a day (QID) | INTRAMUSCULAR | Status: DC | PRN
  Filled 2019-03-03: qty 1

## 2019-03-03 MED ORDER — LORAZEPAM 2 MG/ML IJ SOLN
2.0000 mg | Freq: Once | INTRAMUSCULAR | Status: AC
  Administered 2019-03-03: 2 mg via INTRAVENOUS
  Filled 2019-03-03: qty 1

## 2019-03-03 MED ORDER — SODIUM CHLORIDE 0.45 % IV SOLN
INTRAVENOUS | Status: DC
  Administered 2019-03-03: 13:00:00 via INTRAVENOUS

## 2019-03-03 NOTE — Progress Notes (Signed)
Patient becoming combative with care, hitting, coarse speech, pulling at IV and attempting to get out of bed. Patient is regarding as high fall risk . Dr Shanon Brow on call Hospitalitist paged and made aware of above assessment. New verbal order initiated for ativan 2mg  IV x once. Will continue to monitor patient has shift progresses

## 2019-03-03 NOTE — Progress Notes (Signed)
PROGRESS NOTE    Kristen Kaufman  DUK:[id number] DOB: [birth year] DOA: 03/01/2019 PCP: Sherrilee Gilles, DO   Brief Narrative:  Per HPI: Kristen Kaufman a 85 y.o.femalewith a history of diabetes, hypertension, stage IV chronic kidney disease, chronic hyper kalemia,gout. Patient seen for increasing suprapubic pain over the past couple of weeks but is acutely worse over the past 3 days. Patient saw her primary care physician who did some blood work and a urine sample. She was told that she had a bladder infection, but said that no antibiotics have been prescribed. She denies fevers, chills, nausea, vomiting. She does have a cough that is productive with yellow phlegm and gradually worsening. No palliating or provoking factors.  She also notes generalized fatigue, but no dizziness or lightheadedness when she is up and moving around.  Patient has been admitted with right lobar pneumonia as well as UTI in the setting of AKI on CKD stage IV.  Patient is also hyperkalemic and states that she has not interested in hemodialysis.  She does follow with a nephrologist in [place], [place]. she has been more agitated and anxious this morning.  Assessment & Plan:   Active Problems:   Diabetes mellitus type 2 in obese [facility])   Essential hypertension   Acute renal failure superimposed on stage 4 chronic kidney disease (HCC)   Anemia in stage 4 chronic kidney disease (HCC)   Hyperkalemia   CAP (community acquired pneumonia)   Acute lower UTI   1. Acute lower UTI with GNR a. Final culture pending b. Repeat CBC in the morning c. Rocephin d. Haldol started as needed for some restlessness/agitation 2. Community-acquired pneumonia a. Antibiotics:Rocephin and azithromycin b. Robitussin c. Blood cultures drawn in the emergency department d. Sputum cultures e. CBC tomorrow f. Strep antigen by urine pending 3. Acute renal failure superimposed on stage IV kidney disease-improving a. Continue on 1/2NS for  12 more hours b. Conitnue sodium bicarb tid c. UO adequate with no hypervolemia; will avoid Nephrology evaluation as patient is not interested in HD; 1700 mL UO over 24 hours 4. Anemia in stage IV chronic kidney disease-stable; not interested in hemodialysis a. Iron studies-with no deficiency b. Likely secondary to chronic kidney disease c. Darbopoetin given 3/14 d. Recheck CBC in am and monitor need for PRBC transfusion; BM with no overt bleeding noted earlier today 5. Hyperkalemia-resolved a. kayexelate given on 3/14 b. Recheck in am 6. Diabetes-controlled a. Sliding scale insulin 7. Hypertension-stable a. We will continue with amlodipine    DVT prophylaxis: Lovenox Code Status: Full Family Communication:  Son is at bedside Disposition Plan: Continue treatment with azithromycin and Rocephin for UTI and pneumonia.    Urine cultures noted with gram-negative rods with final result pending.  Follow a.m. labs.  Potassium improved with use of Kayexalate yesterday.  Continue treatment with IV antibiotics and follow cultures.  Will have hopeful improvement of mentation soon.   Consultants:   None  Procedures:   None  Antimicrobials:   Rocephin and azithromycin 3/13->  Subjective: Patient seen and evaluated today with some restlessness and agitation overnight.  She continues to have some ongoing symptoms this morning.  Objective: Vitals:   03/02/19 1332 03/02/19 1424 03/02/19 2115 03/03/19 0503  BP: (!) 159/70 (!) 141/74 (!) 173/67 (!) 163/73  Pulse: 73 71 86 91  Resp: (!) 22 16  20   Temp: 98 F (36.7 C) 98.5 F (36.9 C) 98.4 F (36.9 C) 98.6 F (37 C)  TempSrc: Oral Oral Oral Oral  SpO2: 96% 99% 94% 92%  Weight:      Height:        Intake/Output Summary (Last 24 hours) at 03/03/2019 1213 Last data filed at 03/03/2019 1100 Gross per 24 hour  Intake 794.06 ml  Output 700 ml  Net 94.06 ml   Filed Weights   03/01/19 1746 03/01/19 2218  Weight: 122.9 kg 85.1  kg    Examination:  General exam: Appears restless and anxious Respiratory system: Clear to auscultation. Respiratory effort normal. Cardiovascular system: S1 & S2 heard, RRR. No JVD, murmurs, rubs, gallops or clicks. No pedal edema. Gastrointestinal system: Abdomen is nondistended, soft and nontender. No organomegaly or masses felt. Normal bowel sounds heard. Central nervous system: Alert and awake. Extremities: Symmetric 5 x 5 power. Skin: No rashes, lesions or ulcers Psychiatry: Cannot be evaluated    Data Reviewed: I have personally reviewed following labs and imaging studies  CBC: Recent Labs  Lab 03/01/19 1829 03/02/19 0652 03/03/19 0834  WBC 9.8 10.3 13.6*  NEUTROABS 6.1  --   --   HGB 7.6* 7.4* 7.1*  HCT 26.0* 25.2* 24.2*  MCV 106.6* 105.9* 106.1*  PLT 226 228 063   Basic Metabolic Panel: Recent Labs  Lab 03/01/19 1829 03/02/19 0027 03/02/19 0652 03/03/19 0834  NA 141 141 139 137  K 5.8* 5.5* 5.5* 4.6  CL 118* 118* 118* 111  CO2 18* 17* 17* 18*  GLUCOSE 143* 108* 93 156*  BUN 97* 92* 90* 81*  CREATININE 4.73* 4.51* 4.19* 3.86*  CALCIUM 7.9* 7.7* 7.7* 7.8*   GFR: Estimated Creatinine Clearance: 10.5 mL/min (A) (by C-G formula based on SCr of 3.86 mg/dL (H)). Liver Function Tests: Recent Labs  Lab 03/01/19 1829  AST 10*  ALT 12  ALKPHOS 129*  BILITOT 0.9  PROT 6.8  ALBUMIN 3.2*   Recent Labs  Lab 03/01/19 1829  LIPASE 42   No results for input(s): AMMONIA in the last 168 hours. Coagulation Profile: No results for input(s): INR, PROTIME in the last 168 hours. Cardiac Enzymes: Recent Labs  Lab 03/01/19 1829  TROPONINI <0.03   BNP (last 3 results) No results for input(s): PROBNP in the last 8760 hours. HbA1C: Recent Labs    03/02/19 0652  HGBA1C 5.6   CBG: Recent Labs  Lab 03/02/19 1105 03/02/19 1630 03/02/19 2116 03/03/19 0804 03/03/19 1138  GLUCAP 121* 98 133* 150* 152*   Lipid Profile: No results for input(s): CHOL,  HDL, LDLCALC, TRIG, CHOLHDL, LDLDIRECT in the last 72 hours. Thyroid Function Tests: No results for input(s): TSH, T4TOTAL, FREET4, T3FREE, THYROIDAB in the last 72 hours. Anemia Panel: Recent Labs    03/01/19 1829 03/01/19 2000  VITAMINB12 381  --   FOLATE  --  5.7*  FERRITIN 532*  --   TIBC 225*  --   IRON 95  --   RETICCTPCT 4.2*  --    Sepsis Labs: Recent Labs  Lab 03/01/19 1830 03/01/19 2000  LATICACIDVEN 1.0 0.8    Recent Results (from the past 240 hour(s))  Urine culture     Status: Abnormal (Preliminary result)   Collection Time: 03/01/19  6:29 PM  Result Value Ref Range Status   Specimen Description   Final    URINE, CATHETERIZED Performed at [facility], [address], [place], [place] [address]    Special Requests   Final    NONE Performed at [facility], [address], [place], [place] [address]    Culture >=100,000 COLONIES/mL [place]  RODS (A)  Final   Report Status PENDING  Incomplete         Radiology Studies: Ct Abdomen Pelvis Wo Contrast  Result Date: 03/01/2019 CLINICAL DATA:  Abdominal pain.  History of hysterectomy. EXAM: CT ABDOMEN AND PELVIS WITHOUT CONTRAST TECHNIQUE: Multidetector CT imaging of the abdomen and pelvis was performed following the standard protocol without IV contrast. COMPARISON:  CT abdomen and pelvis October 30, 2018 FINDINGS: LOWER CHEST: RIGHT middle lobe atelectasis. Included heart size mildly enlarged. Minimal pericardial effusion. HEPATOBILIARY: Punctate layering cholelithiasis without CT findings of acute cholecystitis. Punctate calcific occasion RIGHT lobe of the liver, otherwise negative non-contrast CT liver. PANCREAS: Normal. SPLEEN: Normal. ADRENALS/URINARY TRACT: Kidneys are orthotopic, demonstrating normal size and morphology. No nephrolithiasis, hydronephrosis; limited assessment for renal masses by nonenhanced CT. Stable 2.4 cm homogeneously hypodense benign-appearing cyst upper pole LEFT kidney. The  unopacified ureters are normal in course and caliber. Urinary bladder is partially distended and unremarkable. Normal adrenal glands. STOMACH/BOWEL: The stomach, small and large bowel are normal in course and caliber without inflammatory changes, sensitivity decreased by lack of enteric contrast. Mild stool distended rectum. Moderate colonic diverticulosis. Small, normal appendix. VASCULAR/LYMPHATIC: Aortoiliac vessels are normal in course and caliber. Mild calcific atherosclerosis. No lymphadenopathy by CT size criteria. REPRODUCTIVE: Status post hysterectomy. OTHER: No intraperitoneal free fluid or free air. MUSCULOSKELETAL: Non-acute. Moderate LEFT and mild RIGHT hip osteoarthrosis. Severe L4-5 and L5-S1 degenerative discs. Moderate to severe L4-5 canal stenosis. IMPRESSION: 1. No nephrolithiasis or obstructive uropathy. 2. Diverticulosis and cholelithiasis without acute intra-abdominal/pelvic process. Aortic Atherosclerosis (ICD10-I70.0). Electronically Signed   By: Elon Alas M.D.   On: 03/01/2019 19:46   Dg Chest 2 View  Result Date: 03/03/2019 CLINICAL DATA:  Dyspnea EXAM: CHEST - 2 VIEW COMPARISON:  03/01/2019 and 10/30/2018 FINDINGS: Mild lingular scarring. Additional mild opacities in the bilateral lower lungs favor scarring/atelectasis, especially when correlating with 2019, less likely mild infection. No frank interstitial edema. No pleural effusion or pneumothorax. Cardiomegaly. IMPRESSION: Mild lingular scarring. Suspected bilateral lower lobe scarring/atelectasis. Electronically Signed   By: Julian Hy M.D.   On: 03/03/2019 11:38   Dg Chest 2 View  Result Date: 03/01/2019 CLINICAL DATA:  Pain.  Hypertension. EXAM: CHEST - 2 VIEW COMPARISON:  October 30, 2018 FINDINGS: There is scarring in the lower lung zones bilaterally. There is slight increased opacity in the right base compared to most recent study. Heart is enlarged with pulmonary vascularity normal. No adenopathy. There is  aortic atherosclerosis. No bone lesions. IMPRESSION: Scarring in each lower lobe. Question subtle pneumonia superimposed on scarring in the right base. There is stable cardiac prominence. Aortic Atherosclerosis (ICD10-I70.0). Electronically Signed   By: Lowella Grip III M.D.   On: 03/01/2019 19:37        Scheduled Meds: . amLODipine  10 mg Oral Daily  . aspirin EC  81 mg Oral Daily  . enoxaparin (LOVENOX) injection  30 mg Subcutaneous Q24H  . insulin aspart  0-15 Units Subcutaneous TID WC  . insulin aspart  0-5 Units Subcutaneous QHS  . patiromer  8.4 g Oral Daily  . sodium bicarbonate  650 mg Oral TID   Continuous Infusions: . azithromycin 500 mg (03/02/19 2130)  . cefTRIAXone (ROCEPHIN)  IV 1 g (03/02/19 2134)     LOS: 0 days    Time spent: 30 minutes    Juanantonio Stolar Darleen Crocker, DO [facility] Pager [phone]  If 7PM-7AM, please contact night-coverage [email or web address] Password [facility] 03/03/2019, 12:13 PM

## 2019-03-04 DIAGNOSIS — G92 Toxic encephalopathy: Secondary | ICD-10-CM

## 2019-03-04 DIAGNOSIS — J189 Pneumonia, unspecified organism: Secondary | ICD-10-CM

## 2019-03-04 LAB — GLUCOSE, CAPILLARY
Glucose-Capillary: 153 mg/dL — ABNORMAL HIGH (ref 70–99)
Glucose-Capillary: 173 mg/dL — ABNORMAL HIGH (ref 70–99)
Glucose-Capillary: 138 mg/dL — ABNORMAL HIGH (ref 70–99)
Glucose-Capillary: 164 mg/dL — ABNORMAL HIGH (ref 70–99)

## 2019-03-04 LAB — URINE CULTURE

## 2019-03-04 MED ORDER — CEFDINIR 300 MG PO CAPS
300.0000 mg | ORAL_CAPSULE | Freq: Every day | ORAL | Status: DC
  Administered 2019-03-04 – 2019-03-07 (×4): 300 mg via ORAL
  Filled 2019-03-04 (×4): qty 1

## 2019-03-04 MED ORDER — QUETIAPINE FUMARATE 25 MG PO TABS
12.5000 mg | ORAL_TABLET | Freq: Two times a day (BID) | ORAL | Status: DC
  Administered 2019-03-04 – 2019-03-07 (×6): 12.5 mg via ORAL
  Filled 2019-03-04 (×6): qty 1

## 2019-03-04 NOTE — TOC Initial Note (Signed)
Transition of Care [facility]) - Initial/Assessment Note    Patient Details  Name: Kristen Kaufman MRN: [id number] Date of Birth: 08/16/1931  [facility] CM/SW Contact:    Sherald Barge, RN Phone Number: 03/04/2019, 1:29 PM  Clinical Narrative:     Son and significant other at bedside. PT recommends SNF, pt's son agreeable and would like for pt to be in [place] area. He lives in [place]. CMS list given and son plans to research and given preferences on 03/05/2019. This CM's contact info provided.              Expected Discharge Plan: [place]     Patient Goals and CMS Choice Patient states their goals for this hospitalization and ongoing recovery are:: be able to return home CMS Medicare.gov Compare Post Acute Care list provided to:: Patient Represenative (must comment)(son and POA Beatris Ship) Choice offered to / list presented to : Adult Children, HC POA / Guardian  Expected Discharge Plan and Services Expected Discharge Plan: [place] Choice: [place] arrangements for the past 2 months: Single Family Home                          Prior Living Arrangements/Services Living arrangements for the past 2 months: Single Family Home Lives with:: Significant Other Patient language and need for interpreter reviewed:: No Do you feel safe going back to the place where you live?: (N/A - going to SNF)      Need for Family Participation in Patient Care: Yes (Comment) Care giver support system in place?: Yes (comment) Current home services: Homehealth aide Criminal Activity/Legal Involvement Pertinent to Current Situation/Hospitalization: No - Comment as needed  Activities of Daily Living Home Assistive Devices/Equipment: Cane (specify quad or straight), Wheelchair, Environmental consultant (specify type) ADL Screening (condition at time of admission) Patient's cognitive ability adequate to safely complete  daily activities?: Yes Is the patient deaf or have difficulty hearing?: No Does the patient have difficulty seeing, even when wearing glasses/contacts?: No Does the patient have difficulty concentrating, remembering, or making decisions?: No Patient able to express need for assistance with ADLs?: Yes Does the patient have difficulty dressing or bathing?: No Independently performs ADLs?: No Communication: Independent Is this a change from baseline?: Pre-admission baseline Dressing (OT): Needs assistance Is this a change from baseline?: Pre-admission baseline Grooming: Needs assistance Is this a change from baseline?: Pre-admission baseline Feeding: Independent Bathing: Needs assistance Is this a change from baseline?: Pre-admission baseline Toileting: Needs assistance Is this a change from baseline?: Pre-admission baseline In/Out Bed: Needs assistance, Dependent Is this a change from baseline?: Pre-admission baseline Walks in Home: Needs assistance Is this a change from baseline?: Pre-admission baseline Does the patient have difficulty walking or climbing stairs?: Yes Weakness of Legs: Both Weakness of Arms/Hands: None  Permission Sought/Granted                  Emotional Assessment Appearance:: Appears stated age Attitude/Demeanor/Rapport: (appropriate) Affect (typically observed): Accepting, Appropriate, Pleasant Orientation: : Oriented to Place, Oriented to Self Alcohol / Substance Use: Not Applicable    Admission diagnosis:  Cystitis [N30.90] Acute kidney injury superimposed on CKD ([place]) [N17.9, N18.9] Anemia, unspecified type [D64.9] Community acquired pneumonia, unspecified laterality [J18.9] Patient Active Problem List   Diagnosis Date Noted  . CAP (community acquired pneumonia) 03/01/2019  . Acute lower UTI 03/01/2019  . Rheumatoid arthritis ([place]) 06/12/2018  .  Chest pain 06/12/2018  . Gout 02/13/2018  . CAD (coronary artery disease) 02/13/2018  .  Constipation 06/13/2017  . AKI (acute kidney injury) ([place]) 05/30/2017  . UTI (urinary tract infection), uncomplicated 40/98/1191  . UTI (urinary tract infection) 05/25/2017  . Hyperkalemia 05/25/2017  . Acute renal failure superimposed on stage 4 chronic kidney disease ([place])   . Anemia in stage 4 chronic kidney disease ([place])   . Renal insufficiency 05/24/2017  . Diabetes mellitus type 2 in obese ([place]) 05/24/2017  . Essential hypertension 05/24/2017   PCP:  Sherrilee Gilles, DO Pharmacy:   [facility] [place], [place] AT [place] [address] Phone: [phone] Fax: [phone]     Social Determinants of Health (SDOH) Interventions    Readmission Risk Interventions 30 Day Unplanned Readmission Risk Score     ED to Hosp-Admission (Current) from 03/01/2019 in [place]  30 Day Unplanned Readmission Risk Score (%)  23 Filed at 03/04/2019 1200     This score is the patient's risk of an unplanned readmission within 30 days of being discharged (0 -100%). The score is based on dignosis, age, lab data, medications, orders, and past utilization.   Low:  0-14.9   Medium: 15-21.9   High: 22-29.9   Extreme: 30 and above       No flowsheet data found.

## 2019-03-04 NOTE — Evaluation (Signed)
Physical Therapy Evaluation Patient Details Name: Kristen Kaufman MRN: [id number] DOB: 07/06/1931 Today's Date: 03/04/2019   History of Present Illness  Kristen Kaufman is a 85 y.o. female with a history of diabetes, hypertension, stage IV chronic kidney disease, chronic hyper kalemia, gout.  Patient seen for increasing suprapubic pain over the past couple of weeks but is acutely worse over the past 3 days.  Patient saw her primary care physician who did some blood work and a urine sample.  She was told that she had a bladder infection, but said that no antibiotics have been prescribed.  She denies fevers, chills, nausea, vomiting.  She does have a cough that is productive with yellow phlegm and gradually worsening.  No palliating or provoking factors.    Clinical Impression  Patient very weak requiring much assistance for bed mobility with constant/verbal tactile cueing for using BUE to help sit up, c/o severe pain with pressure to BLE especially LLE, unable to stand due to weakness and required 2 person assist to reposition when put back to bed.  Patient will benefit from continued physical therapy in hospital and recommended venue below to increase strength, balance, endurance for safe ADLs and gait.    Follow Up Recommendations SNF    Equipment Recommendations  None recommended by PT    Recommendations for Other Services       Precautions / Restrictions Restrictions Weight Bearing Restrictions: No      Mobility  Bed Mobility Overal bed mobility: Needs Assistance Bed Mobility: Supine to Sit;Sit to Supine     Supine to sit: Max assist Sit to supine: Max assist      Transfers                    Ambulation/Gait                Stairs            Wheelchair Mobility    Modified [place] (Stroke Patients Only)       Balance Overall balance assessment: Needs assistance Sitting-balance support: Feet supported;No upper extremity supported Sitting  balance-Leahy Scale: Fair         Standing balance comment: unable to stand                             Pertinent Vitals/Pain Pain Assessment: 0-10 Pain Score: 7  Pain Location: stomach Pain Descriptors / Indicators: Discomfort;Constant Pain Intervention(s): Limited activity within patient's tolerance;Monitored during session    [place] expects to be discharged to:: Private residence Living Arrangements: Spouse/significant other Available Help at Discharge: Family Type of Home: House Home Access: Stairs to enter Entrance Stairs-Rails: Right Entrance Stairs-Number of Steps: 3 Home Layout: One level Home Equipment: Shower seat;Wheelchair - manual;Cane - single point;Walker - 4 wheels      Prior Function Level of Independence: Independent with assistive device(s)         Comments: household ambulator with Rollator     Hand Dominance   Dominant Hand: Right    Extremity/Trunk Assessment   Upper Extremity Assessment Upper Extremity Assessment: Generalized weakness    Lower Extremity Assessment Lower Extremity Assessment: Generalized weakness    Cervical / Trunk Assessment Cervical / Trunk Assessment: Normal  Communication   Communication: No difficulties  Cognition Arousal/Alertness: Awake/alert Behavior During Therapy: WFL for tasks assessed/performed Overall Cognitive Status: Within Functional Limits for tasks assessed  General Comments      Exercises     Assessment/Plan    PT Assessment Patient needs continued PT services  PT Problem List Decreased strength;Decreased activity tolerance;Decreased balance;Decreased mobility       PT Treatment Interventions Gait training;Therapeutic exercise;Stair training;Functional mobility training;Therapeutic activities;Patient/family education    PT Goals (Current goals can be found in the Care Plan section)  Acute Rehab PT  Goals Patient Stated Goal: return home after rehab PT Goal Formulation: With patient/family Time For Goal Achievement: 03/18/19 Potential to Achieve Goals: Fair    Frequency Min 3X/week   Barriers to discharge        Co-evaluation               AM-PAC PT "6 Clicks" Mobility  Outcome Measure Help needed turning from your back to your side while in a flat bed without using bedrails?: A Lot Help needed moving from lying on your back to sitting on the side of a flat bed without using bedrails?: A Lot Help needed moving to and from a bed to a chair (including a wheelchair)?: Total Help needed standing up from a chair using your arms (e.g., wheelchair or bedside chair)?: Total Help needed to walk in hospital room?: Total Help needed climbing 3-5 steps with a railing? : Total 6 Click Score: 8    End of Session   Activity Tolerance: Patient tolerated treatment well;Patient limited by fatigue;Patient limited by pain Patient left: in bed;with call bell/phone within reach;with bed alarm set Nurse Communication: Mobility status PT Visit Diagnosis: Unsteadiness on feet (R26.81);Other abnormalities of gait and mobility (R26.89);Muscle weakness (generalized) (M62.81)    Time: 1779-3903 PT Time Calculation (min) (ACUTE ONLY): 31 min   Charges:   PT Evaluation $PT Eval Moderate Complexity: 1 Mod PT Treatments $Therapeutic Activity: 23-37 mins        12:33 PM, 03/04/19 Lonell Grandchild, MPT Physical Therapist with [facility] [phone] office [phone] mobile phone

## 2019-03-04 NOTE — Progress Notes (Signed)
PROGRESS NOTE    Kristen Kaufman  [id number]:[id number] DOB: January 23, 1931 DOA: 03/01/2019 PCP: Sherrilee Gilles, DO   Brief Narrative:  Per HPI: Kristen Kaufman a 85 y.o.femalewith a history of diabetes, hypertension, stage IV chronic kidney disease, chronic hyper kalemia,gout. Patient seen for increasing suprapubic pain over the past couple of weeks but is acutely worse over the past 3 days. Patient saw her primary care physician who did some blood work and a urine sample. She was told that she had a bladder infection, but said that no antibiotics have been prescribed. She denies fevers, chills, nausea, vomiting. She does have a cough that is productive with yellow phlegm and gradually worsening. No palliating or provoking factors.  She also notes generalized fatigue, but no dizziness or lightheadedness when she is up and moving around.  Patient has been admitted with right lobar pneumonia as well as UTI in the setting of AKI on CKD stage IV.  Patient is also hyperkalemic and states that she has not interested in hemodialysis.  She does follow with a nephrologist in [place], [place]. she has been more agitated and anxious this morning.  Assessment & Plan:   Active Problems:   Diabetes mellitus type 2 in obese [facility])   Essential hypertension   Acute renal failure superimposed on stage 4 chronic kidney disease (HCC)   Anemia in stage 4 chronic kidney disease (HCC)   UTI (urinary tract infection)   Hyperkalemia   CAP (community acquired pneumonia)   Acute lower UTI   1. Acute lower UTI; due to E. coli microorganism a. Pansensitive b. Patient without nausea/vomiting; will transition to cefdinir c. Continue supportive care, patient advised to maintain adequate hydration.  Follow clinical improvement d. Physical therapy evaluation to assist with discharge recommendations 2. Community-acquired pneumonia a. Will transition antibiotics to oral cefdinir. b. Continue as needed Robitussin c. Blood  cultures without any growth. d. Follow final sputum cultures e. Repeat CBC in a.m. f. Strep antigen in urine negative 3. Acute renal failure superimposed on stage IV kidney disease-has continue improving a. Discontinue IV fluids and asked patient to maintain adequate oral hydration. b. Conitnue sodium bicarb tid c. UO adequate with no hypervolemia 4. Anemia in stage IV chronic kidney disease-stable; not interested in hemodialysis a. Iron studies-with no deficiency b. Likely secondary to chronic kidney disease c. Darbopoetin given 3/14 d. No signs of overt bleeding appreciated.  Repeat CBC in a.m.; will transfuse for hemoglobin less than 7.  Patient hemodynamically stable. 5. Hyperkalemia- a. Patient received Kayexalate 3/14. b. Has remained stable and well-controlled.  Continue to monitor potassium trend. c. Will discontinue telemetry. 6. Diabetes-controlled a. Continue sliding scale insulin; follow CBGs and further adjust hypoglycemic regimen as needed. 7. Hypertension-stable a. Continue current antihypertensive regimen.    DVT prophylaxis: Lovenox Code Status: Full Family Communication:  Son is at bedside Disposition Plan:  Transition antibiotics to p.o., continue supportive care, follow CBC/basic metabolic panel and have physical therapy assess inpatient for recommendations of discharge.   Consultants:   None  Procedures:   None  Antimicrobials:   Rocephin and azithromycin 3/13-> 3/16  Cefdinir: 3/16  Subjective: Afebrile, denies chest pain, no shortness of breath.  Still with intermittent episode of confusion as per family member at bedside.  Patient reports feeling weak, tired/deconditioned and having still some suprapubic abdominal discomfort.  No nausea vomiting.  Objective: Vitals:   03/03/19 2102 03/03/19 2107 03/04/19 0601 03/04/19 1427  BP: 90/63 (!) 171/88 (!) 164/78 (!) 158/71  Pulse: (!) 101 Marland Kitchen)  101 (!) 105 98  Resp: (!) 21     Temp: 100.1 F  (37.8 C)  99.7 F (37.6 C) 98.8 F (37.1 C)  TempSrc: Oral  Oral Oral  SpO2: 98%  93% 93%  Weight:      Height:        Intake/Output Summary (Last 24 hours) at 03/04/2019 1725 Last data filed at 03/04/2019 1300 Gross per 24 hour  Intake 1405.04 ml  Output -  Net 1405.04 ml   Filed Weights   03/01/19 1746 03/01/19 2218  Weight: 122.9 kg 85.1 kg    Examination: General exam: Alert, awake, oriented x 2 (name and place); per family members at baseline still with intermittent episode of confusion.  Patient is afebrile and complaining of mild suprapubic discomfort. Respiratory system: Clear to auscultation. Respiratory effort normal. Cardiovascular system:RRR. No murmurs, rubs, gallops. Gastrointestinal system: Abdomen is nondistended, soft and mildly tender to palpation in suprapubic area and left lower quadrant.  No organomegaly or masses felt. Normal bowel sounds heard. Central nervous system: No focal neurological deficits; moving 4 limbs spontaneously. Extremities: No cyanosis or clubbing. Skin: No rashes, lesions or ulcers Psychiatry: Judgment and insight appears impaired in the setting of acute encephalopathic process.  Mood is a stable currently.    Data Reviewed: I have personally reviewed following labs and imaging studies  CBC: Recent Labs  Lab 03/01/19 1829 03/02/19 0652 03/03/19 0834  WBC 9.8 10.3 13.6*  NEUTROABS 6.1  --   --   HGB 7.6* 7.4* 7.1*  HCT 26.0* 25.2* 24.2*  MCV 106.6* 105.9* 106.1*  PLT 226 228 009   Basic Metabolic Panel: Recent Labs  Lab 03/01/19 1829 03/02/19 0027 03/02/19 0652 03/03/19 0834  NA 141 141 139 137  K 5.8* 5.5* 5.5* 4.6  CL 118* 118* 118* 111  CO2 18* 17* 17* 18*  GLUCOSE 143* 108* 93 156*  BUN 97* 92* 90* 81*  CREATININE 4.73* 4.51* 4.19* 3.86*  CALCIUM 7.9* 7.7* 7.7* 7.8*   GFR: Estimated Creatinine Clearance: 10.5 mL/min (A) (by C-G formula based on SCr of 3.86 mg/dL (H)).   Liver Function Tests: Recent Labs   Lab 03/01/19 1829  AST 10*  ALT 12  ALKPHOS 129*  BILITOT 0.9  PROT 6.8  ALBUMIN 3.2*   Recent Labs  Lab 03/01/19 1829  LIPASE 42   Cardiac Enzymes: Recent Labs  Lab 03/01/19 1829  TROPONINI <0.03   HbA1C: Recent Labs    03/02/19 0652  HGBA1C 5.6   CBG: Recent Labs  Lab 03/03/19 1631 03/03/19 2057 03/04/19 0748 03/04/19 1114 03/04/19 1607  GLUCAP 82 159* 153* 173* 138*   Anemia Panel: Recent Labs    03/01/19 1829 03/01/19 2000  VITAMINB12 381  --   FOLATE  --  5.7*  FERRITIN 532*  --   TIBC 225*  --   IRON 95  --   RETICCTPCT 4.2*  --    Sepsis Labs: Recent Labs  Lab 03/01/19 1830 03/01/19 2000  LATICACIDVEN 1.0 0.8    Recent Results (from the past 240 hour(s))  Urine culture     Status: Abnormal   Collection Time: 03/01/19  6:29 PM  Result Value Ref Range Status   Specimen Description   Final    URINE, CATHETERIZED Performed at [facility], [address] [place], [place] [address]    Special Requests   Final    NONE Performed at [facility], [address], [place], [place] [address]    Culture >=  100,000 COLONIES/mL ESCHERICHIA COLI (A)  Final   Report Status 03/04/2019 FINAL  Final   Organism ID, Bacteria ESCHERICHIA COLI (A)  Final      Susceptibility   Escherichia coli - MIC*    AMPICILLIN 8 SENSITIVE Sensitive     CEFAZOLIN <=4 SENSITIVE Sensitive     CEFTRIAXONE <=1 SENSITIVE Sensitive     CIPROFLOXACIN <=0.25 SENSITIVE Sensitive     GENTAMICIN <=1 SENSITIVE Sensitive     IMIPENEM <=0.25 SENSITIVE Sensitive     NITROFURANTOIN <=16 SENSITIVE Sensitive     TRIMETH/SULFA <=20 SENSITIVE Sensitive     AMPICILLIN/SULBACTAM 4 SENSITIVE Sensitive     PIP/TAZO <=4 SENSITIVE Sensitive     Extended ESBL NEGATIVE Sensitive     * >=100,000 COLONIES/mL ESCHERICHIA COLI     Radiology Studies: Dg Chest 2 View  Result Date: 03/03/2019 CLINICAL DATA:  Dyspnea EXAM: CHEST - 2 VIEW COMPARISON:  03/01/2019 and 10/30/2018 FINDINGS: Mild  lingular scarring. Additional mild opacities in the bilateral lower lungs favor scarring/atelectasis, especially when correlating with 2019, less likely mild infection. No frank interstitial edema. No pleural effusion or pneumothorax. Cardiomegaly. IMPRESSION: Mild lingular scarring. Suspected bilateral lower lobe scarring/atelectasis. Electronically Signed   By: Julian Hy M.D.   On: 03/03/2019 11:38    Scheduled Meds: . amLODipine  10 mg Oral Daily  . aspirin EC  81 mg Oral Daily  . cefdinir  300 mg Oral Daily  . enoxaparin (LOVENOX) injection  30 mg Subcutaneous Q24H  . insulin aspart  0-15 Units Subcutaneous TID WC  . insulin aspart  0-5 Units Subcutaneous QHS  . patiromer  8.4 g Oral Daily  . QUEtiapine  12.5 mg Oral BID  . sodium bicarbonate  650 mg Oral TID   Continuous Infusions:    LOS: 1 day    Time spent: 30 minutes    Barton Dubois, MD [facility] Pager [phone]  03/04/2019, 5:25 PM

## 2019-03-04 NOTE — Plan of Care (Signed)
  Problem: Acute Rehab PT Goals(only PT should resolve) Goal: Pt Will Go Supine/Side To Sit Outcome: Progressing Flowsheets (Taken 03/04/2019 1235) Pt will go Supine/Side to Sit: with moderate assist Goal: Patient Will Transfer Sit To/From Stand Outcome: Progressing Flowsheets (Taken 03/04/2019 1235) Patient will transfer sit to/from stand: with maximum assist Goal: Pt Will Transfer Bed To Chair/Chair To Bed Outcome: Progressing Flowsheets (Taken 03/04/2019 1235) Pt will Transfer Bed to Chair/Chair to Bed: with max assist Goal: Pt Will Ambulate Outcome: Progressing Flowsheets (Taken 03/04/2019 1235) Pt will Ambulate: 10 feet; with maximum assist; with rolling walker   12:36 PM, 03/04/19 Lonell Grandchild, MPT Physical Therapist with [facility] [phone] office [phone] mobile phone

## 2019-03-05 LAB — BASIC METABOLIC PANEL
Anion gap: 8 (ref 5–15)
BUN: 69 mg/dL — ABNORMAL HIGH (ref 8–23)
CO2: 18 mmol/L — ABNORMAL LOW (ref 22–32)
Calcium: 7.7 mg/dL — ABNORMAL LOW (ref 8.9–10.3)
Chloride: 111 mmol/L (ref 98–111)
Creatinine, Ser: 4.34 mg/dL — ABNORMAL HIGH (ref 0.44–1.00)
GFR calc non Af Amer: 9 mL/min — ABNORMAL LOW (ref >60)
GFR, EST AFRICAN AMERICAN: 10 mL/min — AB (ref >60)
Glucose, Bld: 134 mg/dL — ABNORMAL HIGH (ref 70–99)
Potassium: 4.1 mmol/L (ref 3.5–5.1)
Sodium: 137 mmol/L (ref 135–145)

## 2019-03-05 LAB — GLUCOSE, CAPILLARY
Glucose-Capillary: 121 mg/dL — ABNORMAL HIGH (ref 70–99)
Glucose-Capillary: 157 mg/dL — ABNORMAL HIGH (ref 70–99)
Glucose-Capillary: 103 mg/dL — ABNORMAL HIGH (ref 70–99)
Glucose-Capillary: 158 mg/dL — ABNORMAL HIGH (ref 70–99)

## 2019-03-05 LAB — PREPARE RBC (CROSSMATCH)

## 2019-03-05 LAB — CBC
HCT: 20.3 % — ABNORMAL LOW (ref 36.0–46.0)
Hemoglobin: 6.3 g/dL — CL (ref 12.0–15.0)
MCH: 32.1 pg (ref 26.0–34.0)
MCHC: 31 g/dL (ref 30.0–36.0)
MCV: 103.6 fL — ABNORMAL HIGH (ref 80.0–100.0)
NRBC: 0.1 % (ref 0.0–0.2)
Platelets: 181 10*3/uL (ref 150–400)
RBC: 1.96 MIL/uL — ABNORMAL LOW (ref 3.87–5.11)
RDW: 13.9 % (ref 11.5–15.5)
WBC: 16.1 10*3/uL — ABNORMAL HIGH (ref 4.0–10.5)

## 2019-03-05 LAB — ABO/RH: ABO/RH(D): A POS

## 2019-03-05 MED ORDER — COLCHICINE 0.6 MG PO TABS
0.3000 mg | ORAL_TABLET | Freq: Every day | ORAL | Status: DC
  Administered 2019-03-05 – 2019-03-07 (×3): 0.3 mg via ORAL
  Filled 2019-03-05: qty 0.5
  Filled 2019-03-05: qty 1
  Filled 2019-03-05: qty 0.5
  Filled 2019-03-05: qty 1
  Filled 2019-03-05: qty 0.5
  Filled 2019-03-05: qty 1
  Filled 2019-03-05: qty 0.5

## 2019-03-05 MED ORDER — SODIUM CHLORIDE 0.9% IV SOLUTION
Freq: Once | INTRAVENOUS | Status: AC
  Administered 2019-03-05: 14:00:00 via INTRAVENOUS

## 2019-03-05 NOTE — Progress Notes (Signed)
Telemetry called to report pt had a run of a fib in the 180s. Pt now running sinus tach 104. [facility] notified.

## 2019-03-05 NOTE — Progress Notes (Signed)
PT Cancellation Note  Patient Details Name: Kristen Kaufman MRN: [id number] DOB: [birth year]   Cancelled Treatment:    Reason Eval/Treat Not Completed: Medical issues which prohibited therapy.  Patient's HGB low, receiving blood transfusion today, will check back tomorrow.   10:17 AM, 03/05/19 Lonell Grandchild, MPT Physical Therapist with [facility] [phone] office [phone] mobile phone

## 2019-03-05 NOTE — TOC Progression Note (Signed)
Transition of Care [facility]) - Progression Note    Patient Details  Name: Kristen Kaufman MRN: [id number] Date of Birth: May 12, 1931  Transition of Care [facility]) CM/SW Contact  Roda Shutters Margretta Sidle, RN Phone Number: 03/05/2019, 8:28 AM  Clinical Narrative:   Contacted by son with facility preferences. Referral sent and awaiting bed offers.    Expected Discharge Plan: [place]    Expected Discharge Plan and Services Expected Discharge Plan: [place] Choice: [place] Living arrangements for the past 2 months: Single Family Home                     Readmission Risk Interventions 30 Day Unplanned Readmission Risk Score     ED to Hosp-Admission (Current) from 03/01/2019 in [place]  30 Day Unplanned Readmission Risk Score (%)  24 Filed at 03/05/2019 0801     This score is the patient's risk of an unplanned readmission within 30 days of being discharged (0 -100%). The score is based on dignosis, age, lab data, medications, orders, and past utilization.   Low:  0-14.9   Medium: 15-21.9   High: 22-29.9   Extreme: 30 and above       No flowsheet data found.

## 2019-03-05 NOTE — Progress Notes (Signed)
PROGRESS NOTE    Kristen Kaufman  MLY:[id number] DOB: February 28, 1931 DOA: 03/01/2019 PCP: Sherrilee Gilles, DO   Brief Narrative:  Per HPI: Kristen Kaufman a 85 y.o.femalewith a history of diabetes, hypertension, stage IV chronic kidney disease, chronic hyper kalemia,gout. Patient seen for increasing suprapubic pain over the past couple of weeks but is acutely worse over the past 3 days. Patient saw her primary care physician who did some blood work and a urine sample. She was told that she had a bladder infection, but said that no antibiotics have been prescribed. She denies fevers, chills, nausea, vomiting. She does have a cough that is productive with yellow phlegm and gradually worsening. No palliating or provoking factors.  She also notes generalized fatigue, but no dizziness or lightheadedness when she is up and moving around.  Patient has been admitted with right lobar pneumonia as well as UTI in the setting of AKI on CKD stage IV.  Patient is also hyperkalemic and states that she has not interested in hemodialysis.  She does follow with a nephrologist in [place], [place]. she has been more agitated and anxious this morning.  Assessment & Plan:   Active Problems:   Diabetes mellitus type 2 in obese [facility])   Essential hypertension   Acute renal failure superimposed on stage 4 chronic kidney disease (HCC)   Anemia in stage 4 chronic kidney disease (HCC)   UTI (urinary tract infection)   Hyperkalemia   CAP (community acquired pneumonia)   Acute lower UTI   1. Acute lower UTI; due to E. coli microorganism a. Pansensitive b. Patient without nausea/vomiting; will continue cefdinir c. Continue supportive care, patient advised to maintain adequate hydration.  Follow clinical improvement d. Physical therapy evaluation appreciated; patient found to be significantly deconditioned and in need of nursing home for rehabilitation.  Family and patient in agreement.  Social worker has been made  aware.  2. Community-acquired pneumonia a. Continue oral antibiotics at this time. b. Continue as needed Robitussin c. Blood cultures without any growth. d. Follow final sputum cultures e. Strep antigen in urine negative 3. Acute renal failure superimposed on stage IV kidney disease- a. Discontinue IV fluids and asked patient to maintain adequate oral hydration. b. Conitnue sodium bicarb tid c. UO adequate with no hypervolemia d. Continue to follow renal function intermittently. e. Patient has declined dialysis in the past and currently as well. 4. Anemia in stage IV chronic kidney disease-stable; not interested in hemodialysis a. Iron studies-with no deficiency b. Likely secondary to chronic kidney disease c. Darbopoetin given 3/14 d. No signs of overt bleeding appreciated.   e. CBC down to 6.3; patient will be typed and screened and 2 units of PRBCs will be transfused.  5. Hyperkalemia- a. Patient received Kayexalate 3/14. b. Has remained stable and well-controlled.  Continue to monitor potassium trend. c. Will discontinue telemetry. 6. Diabetes-controlled a. Continue sliding scale insulin; follow CBGs and further adjust hypoglycemic regimen as needed. 7. Gout: With left knee exacerbation.  Resume colchicine at half dose due to current renal failure. 8. Hypertension-stable a. Continue current antihypertensive regimen.   DVT prophylaxis: Lovenox Code Status: Full Family Communication:  Son is at bedside Disposition Plan:  Continue oral antibiotics, continue supportive care, hemoglobin below 7 and will transfuse 2 units.  Low-dose colchicine to be started as part of the treatment for left knee gout arthritis.   Consultants:   None  Procedures:   None  Antimicrobials:   Rocephin and azithromycin 3/13-> 3/16  Cefdinir: 3/16  Subjective: No nausea, no vomiting, no chest pain, no shortness of breath.  Mentation continue improving as per family members at bedside.   Patient weak and deconditioned.  Currently afebrile, but low-grade temperature appreciated on records overnight.  Complaining of left knee pain and swelling.  Objective: Vitals:   03/05/19 0500 03/05/19 0952 03/05/19 1424 03/05/19 1458  BP: (!) 139/55 132/65 (!) 149/74 (!) 161/62  Pulse: 97 93 95 91  Resp: 18 20 20 20   Temp: (!) 100.5 F (38.1 C) 100 F (37.8 C) 98.3 F (36.8 C) 98.4 F (36.9 C)  TempSrc: Oral Oral Oral Oral  SpO2: 100% 99% 100% 97%  Weight:      Height:        Intake/Output Summary (Last 24 hours) at 03/05/2019 1530 Last data filed at 03/05/2019 1443 Gross per 24 hour  Intake 620 ml  Output 200 ml  Net 420 ml   Filed Weights   03/01/19 1746 03/01/19 2218  Weight: 122.9 kg 85.1 kg    Examination: General exam: Alert, awake, oriented x 2; low-grade fever overnight appreciated were reviewing records.  No nausea, no vomiting, no chest pain, no shortness of breath, no abdominal pain.  Patient complaining of left knee pain and swelling. Respiratory system: Clear to auscultation. Respiratory effort normal. Cardiovascular system:RRR. No murmurs, rubs, gallops. Gastrointestinal system: Abdomen is nondistended, soft and nontender. No organomegaly or masses felt. Normal bowel sounds heard. Central nervous system: Alert and oriented. No focal neurological deficits. Extremities: No cyanosis or clubbing; no swelling in her lower extremities.  Left knee swelling, tenderness to palpation and more sensation. Skin: No rashes, no petechiae, no open wounds. Psychiatry: Mood and affect appropriate.  Data Reviewed: I have personally reviewed following labs and imaging studies  CBC: Recent Labs  Lab 03/01/19 1829 03/02/19 0652 03/03/19 0834 03/05/19 0439  WBC 9.8 10.3 13.6* 16.1*  NEUTROABS 6.1  --   --   --   HGB 7.6* 7.4* 7.1* 6.3*  HCT 26.0* 25.2* 24.2* 20.3*  MCV 106.6* 105.9* 106.1* 103.6*  PLT 226 228 230 350   Basic Metabolic Panel: Recent Labs  Lab 03/01/19  1829 03/02/19 0027 03/02/19 0652 03/03/19 0834 03/05/19 0439  NA 141 141 139 137 137  K 5.8* 5.5* 5.5* 4.6 4.1  CL 118* 118* 118* 111 111  CO2 18* 17* 17* 18* 18*  GLUCOSE 143* 108* 93 156* 134*  BUN 97* 92* 90* 81* 69*  CREATININE 4.73* 4.51* 4.19* 3.86* 4.34*  CALCIUM 7.9* 7.7* 7.7* 7.8* 7.7*   GFR: Estimated Creatinine Clearance: 9.4 mL/min (A) (by C-G formula based on SCr of 4.34 mg/dL (H)).   Liver Function Tests: Recent Labs  Lab 03/01/19 1829  AST 10*  ALT 12  ALKPHOS 129*  BILITOT 0.9  PROT 6.8  ALBUMIN 3.2*   Recent Labs  Lab 03/01/19 1829  LIPASE 42   Cardiac Enzymes: Recent Labs  Lab 03/01/19 1829  TROPONINI <0.03   CBG: Recent Labs  Lab 03/04/19 1114 03/04/19 1607 03/04/19 2128 03/05/19 0722 03/05/19 1202  GLUCAP 173* 138* 164* 121* 157*   Sepsis Labs: Recent Labs  Lab 03/01/19 1830 03/01/19 2000  LATICACIDVEN 1.0 0.8    Recent Results (from the past 240 hour(s))  Urine culture     Status: Abnormal   Collection Time: 03/01/19  6:29 PM  Result Value Ref Range Status   Specimen Description   Final    URINE, CATHETERIZED Performed at [facility], [address], [place], [place]  [place]    Special Requests   Final    NONE Performed at [facility], [address] [place], [place] [address]    Culture >=100,000 COLONIES/mL ESCHERICHIA COLI (A)  Final   Report Status 03/04/2019 FINAL  Final   Organism ID, Bacteria ESCHERICHIA COLI (A)  Final      Susceptibility   Escherichia coli - MIC*    AMPICILLIN 8 SENSITIVE Sensitive     CEFAZOLIN <=4 SENSITIVE Sensitive     CEFTRIAXONE <=1 SENSITIVE Sensitive     CIPROFLOXACIN <=0.25 SENSITIVE Sensitive     GENTAMICIN <=1 SENSITIVE Sensitive     IMIPENEM <=0.25 SENSITIVE Sensitive     NITROFURANTOIN <=16 SENSITIVE Sensitive     TRIMETH/SULFA <=20 SENSITIVE Sensitive     AMPICILLIN/SULBACTAM 4 SENSITIVE Sensitive     PIP/TAZO <=4 SENSITIVE Sensitive     Extended ESBL NEGATIVE Sensitive      * >=100,000 COLONIES/mL ESCHERICHIA COLI     Radiology Studies: No results found.  Scheduled Meds: . sodium chloride   Intravenous Once  . amLODipine  10 mg Oral Daily  . aspirin EC  81 mg Oral Daily  . cefdinir  300 mg Oral Daily  . enoxaparin (LOVENOX) injection  30 mg Subcutaneous Q24H  . insulin aspart  0-15 Units Subcutaneous TID WC  . insulin aspart  0-5 Units Subcutaneous QHS  . patiromer  8.4 g Oral Daily  . QUEtiapine  12.5 mg Oral BID  . sodium bicarbonate  650 mg Oral TID   Continuous Infusions:    LOS: 2 days    Time spent: 30 minutes    Barton Dubois, MD [facility] Pager [phone]  03/05/2019, 3:30 PM

## 2019-03-06 LAB — GLUCOSE, CAPILLARY
Glucose-Capillary: 116 mg/dL — ABNORMAL HIGH (ref 70–99)
Glucose-Capillary: 151 mg/dL — ABNORMAL HIGH (ref 70–99)
Glucose-Capillary: 121 mg/dL — ABNORMAL HIGH (ref 70–99)
Glucose-Capillary: 92 mg/dL (ref 70–99)

## 2019-03-06 LAB — BPAM RBC
Blood Product Expiration Date: 202004092359
Blood Product Expiration Date: 202004092359
ISSUE DATE / TIME: 202003171436
ISSUE DATE / TIME: 202003171819
Unit Type and Rh: 6200
Unit Type and Rh: 6200

## 2019-03-06 LAB — TYPE AND SCREEN
ABO/RH(D): A POS
Antibody Screen: NEGATIVE
Unit division: 0
Unit division: 0

## 2019-03-06 LAB — CBC
HCT: 29.7 % — ABNORMAL LOW (ref 36.0–46.0)
Hemoglobin: 9.4 g/dL — ABNORMAL LOW (ref 12.0–15.0)
MCH: 29.9 pg (ref 26.0–34.0)
MCHC: 31.6 g/dL (ref 30.0–36.0)
MCV: 94.6 fL (ref 80.0–100.0)
Platelets: 199 10*3/uL (ref 150–400)
RBC: 3.14 MIL/uL — AB (ref 3.87–5.11)
RDW: 17.6 % — ABNORMAL HIGH (ref 11.5–15.5)
WBC: 15.5 10*3/uL — ABNORMAL HIGH (ref 4.0–10.5)
nRBC: 0 % (ref 0.0–0.2)

## 2019-03-06 MED ORDER — DOCUSATE SODIUM 100 MG PO CAPS
100.0000 mg | ORAL_CAPSULE | Freq: Two times a day (BID) | ORAL | Status: DC
  Administered 2019-03-06 – 2019-03-07 (×2): 100 mg via ORAL
  Filled 2019-03-06 (×2): qty 1

## 2019-03-06 MED ORDER — BISACODYL 10 MG RE SUPP
10.0000 mg | Freq: Every day | RECTAL | Status: DC | PRN
  Administered 2019-03-07: 10 mg via RECTAL
  Filled 2019-03-06: qty 1

## 2019-03-06 MED ORDER — ACETAMINOPHEN 325 MG PO TABS
650.0000 mg | ORAL_TABLET | Freq: Four times a day (QID) | ORAL | Status: DC | PRN
  Administered 2019-03-06 – 2019-03-07 (×2): 650 mg via ORAL
  Filled 2019-03-06 (×2): qty 2

## 2019-03-06 MED ORDER — POLYETHYLENE GLYCOL 3350 17 G PO PACK
17.0000 g | PACK | Freq: Every day | ORAL | Status: DC
  Administered 2019-03-07: 17 g via ORAL
  Filled 2019-03-06: qty 1

## 2019-03-06 NOTE — Clinical Social Work Note (Signed)
Attempted to reach son with no luck, unable to leave VM message.  Called patient, who states son has gone home to take care of some business, will not be back until this evening.  Pt is still hoping to d/c today.  However, Dr spoke with son, and based on that conversation and some medical concerns, has decided to keep patient one more night.  Facility alerted.

## 2019-03-06 NOTE — NC FL2 (Addendum)
MEDICAID FL2 LEVEL OF CARE SCREENING TOOL     IDENTIFICATION  Patient Name: Kristen Kaufman Birthdate: 1931/03/05 Sex: female Admission Date (Current Location): 03/01/2019  [facility] and [place] Number:  [employer] and Address:  [place] [address] [place]      Provider Number: [id number]  Attending Physician Name and Address:  Barton Dubois, MD  Relative Name and Phone Number:  Shenaya Lebo [phone]    Current Level of Care: SNF Recommended Level of Care: [place] Prior Approval Number:    Date Approved/Denied:   PASRR Number: n/a  Discharge Plan: SNF [place], [place]   Current Diagnoses: Patient Active Problem List   Diagnosis Date Noted  . CAP (community acquired pneumonia) 03/01/2019  . Acute lower UTI 03/01/2019  . Rheumatoid arthritis ([place]) 06/12/2018  . Chest pain 06/12/2018  . Gout 02/13/2018  . CAD (coronary artery disease) 02/13/2018  . Constipation 06/13/2017  . AKI (acute kidney injury) ([place]) 05/30/2017  . UTI (urinary tract infection), uncomplicated 13/07/6577  . UTI (urinary tract infection) 05/25/2017  . Hyperkalemia 05/25/2017  . Acute renal failure superimposed on stage 4 chronic kidney disease ([place])   . Anemia in stage 4 chronic kidney disease ([place])   . Renal insufficiency 05/24/2017  . Diabetes mellitus type 2 in obese ([place]) 05/24/2017  . Essential hypertension 05/24/2017    Orientation RESPIRATION BLADDER Height & Weight     Self, Place  Normal Incontinent Weight: 85.1 kg Height:  5\' 1"  (154.9 cm)  BEHAVIORAL SYMPTOMS/MOOD NEUROLOGICAL BOWEL NUTRITION STATUS      Continent Diet(heart healthy)  AMBULATORY STATUS COMMUNICATION OF NEEDS Skin   Total Care Verbally Normal                       Personal Care Assistance Level of Assistance  Bathing, Feeding, Dressing Bathing Assistance: Limited assistance Feeding assistance: Independent Dressing  Assistance: Maximum assistance     Functional Limitations Info  Sight, Hearing, Speech Sight Info: Adequate Hearing Info: Impaired Speech Info: Adequate    SPECIAL CARE FACTORS FREQUENCY  PT (By licensed PT)     PT Frequency: 5days/week              Contractures Contractures Info: Not present    Additional Factors Info  Code Status, Allergies, Insulin Sliding Scale Code Status Info: full Allergies Info: influenza vaccines, menactra    Insulin Sliding Scale Info: see DC summary       Current Medications (03/06/2019):  This is the current hospital active medication list Current Facility-Administered Medications  Medication Dose Route Frequency Provider Last Rate Last Dose  . acetaminophen (TYLENOL) tablet 650 mg  650 mg Oral Q6H PRN Barton Dubois, MD      . amLODipine (NORVASC) tablet 10 mg  10 mg Oral Daily Truett Mainland, DO   10 mg at 03/06/19 1017  . aspirin EC tablet 81 mg  81 mg Oral Daily Truett Mainland, DO   81 mg at 03/06/19 1016  . cefdinir (OMNICEF) capsule 300 mg  300 mg Oral Daily Barton Dubois, MD   300 mg at 03/06/19 1015  . colchicine tablet 0.3 mg  0.3 mg Oral Daily Barton Dubois, MD   0.3 mg at 03/06/19 1016  . enoxaparin (LOVENOX) injection 30 mg  30 mg Subcutaneous Q24H Truett Mainland, DO   30 mg at 03/05/19 2237  . haloperidol lactate (HALDOL) injection 1 mg  1  mg Intravenous Q6H PRN Manuella Ghazi, Pratik D, DO      . hydrALAZINE (APRESOLINE) injection 10 mg  10 mg Intravenous Q4H PRN Manuella Ghazi, Pratik D, DO   10 mg at 03/03/19 0806  . insulin aspart (novoLOG) injection 0-15 Units  0-15 Units Subcutaneous TID WC Truett Mainland, DO   3 Units at 03/06/19 1141  . insulin aspart (novoLOG) injection 0-5 Units  0-5 Units Subcutaneous QHS Stinson, Jacob J, DO      . patiromer [facility]) packet 8.4 g  8.4 g Oral Daily Truett Mainland, DO   8.4 g at 03/06/19 1011  . QUEtiapine (SEROQUEL) tablet 12.5 mg  12.5 mg Oral BID Barton Dubois, MD   12.5 mg at 03/06/19 1015   . sodium bicarbonate tablet 650 mg  650 mg Oral TID Heath Lark D, DO   650 mg at 03/06/19 1017     Discharge Medications: Please see discharge summary for a list of discharge medications.  Relevant Imaging Results:  Relevant Lab Results:   Additional Information ss# [id number]  Trish Mage, [place]

## 2019-03-06 NOTE — Care Management Important Message (Signed)
Important Message  Patient Details  Name: Kristen Kaufman MRN: [id number] Date of Birth: [birth year]   Medicare Important Message Given:  Yes    Tommy Medal 03/06/2019, 1:55 PM

## 2019-03-06 NOTE — TOC Transition Note (Signed)
Transition of Care [facility]) - CM/SW Discharge Note   Patient Details  Name: Kristen Kaufman MRN: [id number] Date of Birth: August 18, 1931  Transition of Care [facility]) CM/SW Contact:  Sherald Barge, RN Phone Number: 03/06/2019, 12:04 PM   Clinical Narrative:   SNF auth received. MD aware. Will DC today.   Final next level of care: [place]     Patient Goals and CMS Choice Patient states their goals for this hospitalization and ongoing recovery are:: be able to return home CMS Medicare.gov Compare Post Acute Care list provided to:: Patient Represenative (must comment)(son and POA Beatris Ship) Choice offered to / list presented to : Adult Children, [place] / [place]  Discharge Placement              Patient chooses bed at: [facility] and [place] Patient to be transferred to facility by: [name]  Name of family member notified: larry Patient and family notified of of transfer: 03/06/19  Discharge Plan and Services   Post Acute Care Choice: [place]                     Readmission Risk Interventions Readmission Risk Prevention Plan 03/06/2019  Transportation Screening Complete  PCP or Specialist Appt within 3-5 Days Complete  HRI or [place] Complete  Social Work Consult for [place] Planning/Counseling Complete  Palliative Care Screening Not Applicable  Medication Review Press photographer) Complete

## 2019-03-06 NOTE — Progress Notes (Signed)
PT Cancellation Note  Patient Details Name: Kristen Kaufman MRN: [id number] DOB: 1931/05/16   Cancelled Treatment:    Reason Eval/Treat Not Completed: Other (comment)  Attempted PT session.  Pt being fed via family member, will try again later if available.  [address], LPTA; [place]  Aldona Lento 03/06/2019, 5:23 PM

## 2019-03-06 NOTE — Progress Notes (Signed)
PROGRESS NOTE    Kristen Kaufman  KZS:[id number] DOB: 11-21-1931 DOA: 03/01/2019 PCP: Sherrilee Gilles, DO   Brief Narrative:  Per HPI: Kristen Kaufman a 85 y.o.femalewith a history of diabetes, hypertension, stage IV chronic kidney disease, chronic hyper kalemia,gout. Patient seen for increasing suprapubic pain over the past couple of weeks but is acutely worse over the past 3 days. Patient saw her primary care physician who did some blood work and a urine sample. She was told that she had a bladder infection, but said that no antibiotics have been prescribed. She denies fevers, chills, nausea, vomiting. She does have a cough that is productive with yellow phlegm and gradually worsening. No palliating or provoking factors.  She also notes generalized fatigue, but no dizziness or lightheadedness when she is up and moving around.  Patient has been admitted with right lobar pneumonia as well as UTI in the setting of AKI on CKD stage IV.  Patient is also hyperkalemic and states that she has not interested in hemodialysis.  She does follow with a nephrologist in [place], [place]. she has been more agitated and anxious this morning.  Assessment & Plan:   Active Problems:   Diabetes mellitus type 2 in obese [facility])   Essential hypertension   Acute renal failure superimposed on stage 4 chronic kidney disease (HCC)   Anemia in stage 4 chronic kidney disease (HCC)   UTI (urinary tract infection)   Hyperkalemia   CAP (community acquired pneumonia)   Acute lower UTI   1. Acute lower UTI; due to E. coli microorganism a. Pansensitive b. Patient without nausea/vomiting; will continue cefdinir c. Continue supportive care, patient advised to maintain adequate hydration.  Follow clinical improvement d. Physical therapy evaluation appreciated; patient found to be significantly deconditioned and in need of nursing home for rehabilitation.  Family and patient in agreement.  Social worker has been made  aware.  2. Community-acquired pneumonia a. Continue oral antibiotics at this time. b. Continue as needed Robitussin c. Blood cultures without any growth. d. Follow final sputum cultures e. Strep antigen in urine negative 3. Acute renal failure superimposed on stage IV kidney disease- a. Discontinue IV fluids and asked patient to maintain adequate oral hydration. b. Conitnue sodium bicarb tid c. UO adequate with no hypervolemia d. Continue to follow renal function intermittently. e. Patient has declined dialysis in the past and currently as well. 4. Anemia in stage IV chronic kidney disease-stable; not interested in hemodialysis a. Iron studies-with no deficiency b. Likely secondary to chronic kidney disease c. Darbopoetin given 3/14 d. No signs of overt bleeding appreciated.   e. Hemoglobin 9.4 after 2 units of PRBCs transfused 5. Hyperkalemia- a. Patient received Kayexalate 3/14. b. Has remained stable and well-controlled.  Continue to monitor potassium trend. c. Will discontinue telemetry. 6. Diabetes-controlled a. Continue sliding scale insulin; follow CBGs and further adjust hypoglycemic regimen as needed. 7. Gout: With left knee exacerbation.  Resume colchicine at half dose due to current renal failure. 8. Hypertension-stable a. Continue current antihypertensive regimen.   DVT prophylaxis: Lovenox Code Status: Full Family Communication:  Son is at bedside Disposition Plan:  Continue oral antibiotics, hemoglobin improved after transfusion; continue to follow trend.  Will initiate treatment with Tylenol and continue colchicine for pain most likely associated with gouty arthritis.  Hopefully discharge to a skilled nursing facility for further care and rehabilitation in a.m.   Consultants:   None  Procedures:   None  Antimicrobials:   Rocephin and azithromycin 3/13-> 3/16  Cefdinir: 3/16  Subjective: No nausea, no vomiting, no chest pain, no shortness of  breath.  Stable mentation.  Complaining of left side discomfort (affecting shoulder, lower back and her left knee).  Appetite improving.  Objective: Vitals:   03/05/19 1845 03/05/19 2155 03/06/19 0535 03/06/19 1412  BP: (!) 155/66 (!) 143/69 (!) 151/70 (!) 137/58  Pulse: 93 83 88 89  Resp: 20 (!) 24 16 16   Temp: 99.2 F (37.3 C) 99.8 F (37.7 C) 99.4 F (37.4 C) 99.3 F (37.4 C)  TempSrc: Oral Oral Oral   SpO2: 99% 99% 97% 100%  Weight:      Height:        Intake/Output Summary (Last 24 hours) at 03/06/2019 1751 Last data filed at 03/06/2019 1300 Gross per 24 hour  Intake 1754 ml  Output 600 ml  Net 1154 ml   Filed Weights   03/01/19 1746 03/01/19 2218  Weight: 122.9 kg 85.1 kg    Examination: General exam: Alert, awake, oriented x 3; complaining of left side pain (shoulder, lower back and left knee), no nausea, no vomiting, no chest pain. Respiratory system: Clear to auscultation. Respiratory effort normal. Cardiovascular system:RRR. No murmurs, rubs, gallops. Gastrointestinal system: Abdomen is nondistended, soft and nontender. No organomegaly or masses felt. Normal bowel sounds heard. Central nervous system: Alert and oriented. No focal neurological deficits. Extremities: No cyanosis or clubbing appreciated.  Left knee swelling and tender to palpation.  No erythematous changes appreciated. Skin: No rashes, lesions or ulcers Psychiatry: Mood and affect appropriate.  Data Reviewed: I have personally reviewed following labs and imaging studies  CBC: Recent Labs  Lab 03/01/19 1829 03/02/19 0652 03/03/19 0834 03/05/19 0439 03/06/19 0548  WBC 9.8 10.3 13.6* 16.1* 15.5*  NEUTROABS 6.1  --   --   --   --   HGB 7.6* 7.4* 7.1* 6.3* 9.4*  HCT 26.0* 25.2* 24.2* 20.3* 29.7*  MCV 106.6* 105.9* 106.1* 103.6* 94.6  PLT 226 228 230 181 825   Basic Metabolic Panel: Recent Labs  Lab 03/01/19 1829 03/02/19 0027 03/02/19 0652 03/03/19 0834 03/05/19 0439  NA 141 141 139  137 137  K 5.8* 5.5* 5.5* 4.6 4.1  CL 118* 118* 118* 111 111  CO2 18* 17* 17* 18* 18*  GLUCOSE 143* 108* 93 156* 134*  BUN 97* 92* 90* 81* 69*  CREATININE 4.73* 4.51* 4.19* 3.86* 4.34*  CALCIUM 7.9* 7.7* 7.7* 7.8* 7.7*   GFR: Estimated Creatinine Clearance: 9.4 mL/min (A) (by C-G formula based on SCr of 4.34 mg/dL (H)).   Liver Function Tests: Recent Labs  Lab 03/01/19 1829  AST 10*  ALT 12  ALKPHOS 129*  BILITOT 0.9  PROT 6.8  ALBUMIN 3.2*   Recent Labs  Lab 03/01/19 1829  LIPASE 42   Cardiac Enzymes: Recent Labs  Lab 03/01/19 1829  TROPONINI <0.03   CBG: Recent Labs  Lab 03/05/19 1655 03/05/19 2116 03/06/19 0737 03/06/19 1115 03/06/19 1648  GLUCAP 103* 158* 116* 151* 121*   Sepsis Labs: Recent Labs  Lab 03/01/19 1830 03/01/19 2000  LATICACIDVEN 1.0 0.8    Recent Results (from the past 240 hour(s))  Urine culture     Status: Abnormal   Collection Time: 03/01/19  6:29 PM  Result Value Ref Range Status   Specimen Description   Final    URINE, CATHETERIZED Performed at [facility], [address], [place], [place] [address]    Special Requests   Final    NONE Performed at [facility]  [facility], [address] [place], [place] [address]    Culture >=100,000 COLONIES/mL ESCHERICHIA COLI (A)  Final   Report Status 03/04/2019 FINAL  Final   Organism ID, Bacteria ESCHERICHIA COLI (A)  Final      Susceptibility   Escherichia coli - MIC*    AMPICILLIN 8 SENSITIVE Sensitive     CEFAZOLIN <=4 SENSITIVE Sensitive     CEFTRIAXONE <=1 SENSITIVE Sensitive     CIPROFLOXACIN <=0.25 SENSITIVE Sensitive     GENTAMICIN <=1 SENSITIVE Sensitive     IMIPENEM <=0.25 SENSITIVE Sensitive     NITROFURANTOIN <=16 SENSITIVE Sensitive     TRIMETH/SULFA <=20 SENSITIVE Sensitive     AMPICILLIN/SULBACTAM 4 SENSITIVE Sensitive     PIP/TAZO <=4 SENSITIVE Sensitive     Extended ESBL NEGATIVE Sensitive     * >=100,000 COLONIES/mL ESCHERICHIA COLI     Radiology Studies: No  results found.  Scheduled Meds: . amLODipine  10 mg Oral Daily  . aspirin EC  81 mg Oral Daily  . cefdinir  300 mg Oral Daily  . colchicine  0.3 mg Oral Daily  . enoxaparin (LOVENOX) injection  30 mg Subcutaneous Q24H  . insulin aspart  0-15 Units Subcutaneous TID WC  . insulin aspart  0-5 Units Subcutaneous QHS  . patiromer  8.4 g Oral Daily  . QUEtiapine  12.5 mg Oral BID  . sodium bicarbonate  650 mg Oral TID   Continuous Infusions:    LOS: 3 days    Time spent: 30 minutes    Barton Dubois, MD [facility] Pager [phone]  03/06/2019, 5:51 PM

## 2019-03-07 LAB — BASIC METABOLIC PANEL
Anion gap: 9 (ref 5–15)
BUN: 69 mg/dL — ABNORMAL HIGH (ref 8–23)
CO2: 20 mmol/L — ABNORMAL LOW (ref 22–32)
Calcium: 7.8 mg/dL — ABNORMAL LOW (ref 8.9–10.3)
Chloride: 108 mmol/L (ref 98–111)
Creatinine, Ser: 4.14 mg/dL — ABNORMAL HIGH (ref 0.44–1.00)
GFR calc Af Amer: 11 mL/min — ABNORMAL LOW (ref >60)
GFR calc non Af Amer: 9 mL/min — ABNORMAL LOW (ref >60)
Glucose, Bld: 96 mg/dL (ref 70–99)
Potassium: 4 mmol/L (ref 3.5–5.1)
SODIUM: 137 mmol/L (ref 135–145)

## 2019-03-07 LAB — CBC
HCT: 29.8 % — ABNORMAL LOW (ref 36.0–46.0)
Hemoglobin: 9.5 g/dL — ABNORMAL LOW (ref 12.0–15.0)
MCH: 30.4 pg (ref 26.0–34.0)
MCHC: 31.9 g/dL (ref 30.0–36.0)
MCV: 95.2 fL (ref 80.0–100.0)
Platelets: 233 10*3/uL (ref 150–400)
RBC: 3.13 MIL/uL — ABNORMAL LOW (ref 3.87–5.11)
RDW: 16.7 % — ABNORMAL HIGH (ref 11.5–15.5)
WBC: 12.7 10*3/uL — ABNORMAL HIGH (ref 4.0–10.5)
nRBC: 0.2 % (ref 0.0–0.2)

## 2019-03-07 LAB — GLUCOSE, CAPILLARY: GLUCOSE-CAPILLARY: 87 mg/dL (ref 70–99)

## 2019-03-07 MED ORDER — QUETIAPINE FUMARATE 25 MG PO TABS: 12.5000 mg | ORAL_TABLET | Freq: Two times a day (BID) | ORAL | Status: DC

## 2019-03-07 MED ORDER — SODIUM BICARBONATE 650 MG PO TABS: 650.0000 mg | ORAL_TABLET | Freq: Three times a day (TID) | ORAL | Status: DC

## 2019-03-07 MED ORDER — POLYETHYLENE GLYCOL 3350 17 G PO PACK: 17.0000 g | PACK | Freq: Every day | ORAL | Status: DC

## 2019-03-07 MED ORDER — PATIROMER SORBITEX CALCIUM 8.4 G PO PACK: 8.4000 g | PACK | Freq: Every day | ORAL | Status: DC

## 2019-03-07 MED ORDER — ACETAMINOPHEN 325 MG PO TABS: 650.0000 mg | ORAL_TABLET | Freq: Four times a day (QID) | ORAL | Status: AC | PRN

## 2019-03-07 MED ORDER — COLCHICINE 0.6 MG PO TABS: 0.3000 mg | ORAL_TABLET | Freq: Every day | ORAL | Status: DC

## 2019-03-07 MED ORDER — CEFDINIR 300 MG PO CAPS: 300.0000 mg | ORAL_CAPSULE | Freq: Every day | ORAL | 0 refills | Status: AC

## 2019-03-07 MED ORDER — DOCUSATE SODIUM 100 MG PO CAPS: 100.0000 mg | ORAL_CAPSULE | Freq: Two times a day (BID) | ORAL | Status: DC

## 2019-03-07 NOTE — Progress Notes (Addendum)
Gave report to nursing staff Andee Poles, at [facility] rehab. Patient PIV removed, and she was assisted with dressing. We are awaiting son to transport to facility.

## 2019-03-07 NOTE — Progress Notes (Signed)
Clarified with son that he was comfortable transporting patient to rehab given her extreme weakness, and he stated he was.   Patient assisted into wheelchair by staff. It was very difficult to get her into her sons truck. Her son and husband essentially lifted her into the truck. [facility] staff called and made aware. Draw sheet and gait belt left on patient in sons truck to assist.

## 2019-03-07 NOTE — Progress Notes (Signed)
Physical Therapy Treatment Patient Details Name: Kristen Kaufman MRN: [id number] DOB: 04-19-1931 Today's Date: 03/07/2019    History of Present Illness Kristen Kaufman is a 85 y.o. female with a history of diabetes, hypertension, stage IV chronic kidney disease, chronic hyper kalemia, gout.  Patient seen for increasing suprapubic pain over the past couple of weeks but is acutely worse over the past 3 days.  Patient saw her primary care physician who did some blood work and a urine sample.  She was told that she had a bladder infection, but said that no antibiotics have been prescribed.  She denies fevers, chills, nausea, vomiting.  She does have a cough that is productive with yellow phlegm and gradually worsening.  No palliating or provoking factors.    PT Comments    Patient demonstrates increased tolerance for sitting up at bedside requiring much encouragement due to c/o of severe BLE pain.  Patient sat up at bed side for approximately 25 minutes while performing BLE exercises, required frequent rest breaks, limited to lifting BLE through 20% of available ROM due to c/o severe BLE pain and weakness.  Bleeding noted on dorsal surface left hand possibly from IV insertion site - RN notified and patient requested to go back to bed with Max assist to reposition.   Follow Up Recommendations  SNF     Equipment Recommendations  None recommended by PT    Recommendations for Other Services       Precautions / Restrictions Precautions Precautions: Fall Restrictions Weight Bearing Restrictions: No    Mobility  Bed Mobility Overal bed mobility: Needs Assistance Bed Mobility: Supine to Sit;Sit to Supine     Supine to sit: Mod assist Sit to supine: Mod assist;Max assist   General bed mobility comments: slow labored movement with difficulty moving BLE due to c/o severe pain  Transfers                    Ambulation/Gait                 Stairs             Wheelchair  Mobility    Modified Rankin (Stroke Patients Only)       Balance Overall balance assessment: Needs assistance Sitting-balance support: Feet supported;No upper extremity supported Sitting balance-Leahy Scale: Fair Sitting balance - Comments: fair/good with BUE supported                                    Cognition Arousal/Alertness: Awake/alert Behavior During Therapy: WFL for tasks assessed/performed Overall Cognitive Status: Within Functional Limits for tasks assessed                                 General Comments: slightly apprehensive      Exercises General Exercises - Lower Extremity Long Arc Quad: Seated;Strengthening;AAROM;Both;5 reps Hip Flexion/Marching: Seated;Strengthening;Both;5 reps;AAROM Toe Raises: Seated;Strengthening;AROM;Both;10 reps Heel Raises: Seated;AROM;Strengthening;Both;10 reps    General Comments        Pertinent Vitals/Pain Pain Assessment: Faces Faces Pain Scale: Hurts worst Pain Location: BLE/feet with any pressure or movement Pain Descriptors / Indicators: Sharp;Grimacing;Guarding;Crying;Sore Pain Intervention(s): Limited activity within patient's tolerance;Monitored during session;Premedicated before session    Home Living                      Prior Function  PT Goals (current goals can now be found in the care plan section) Acute Rehab PT Goals Patient Stated Goal: return home after rehab PT Goal Formulation: With patient Time For Goal Achievement: 03/18/19 Potential to Achieve Goals: Fair Progress towards PT goals: Progressing toward goals    Frequency    Min 3X/week      PT Plan Current plan remains appropriate    Co-evaluation              AM-PAC PT "6 Clicks" Mobility   Outcome Measure  Help needed turning from your back to your side while in a flat bed without using bedrails?: A Lot Help needed moving from lying on your back to sitting on the side of a  flat bed without using bedrails?: A Lot Help needed moving to and from a bed to a chair (including a wheelchair)?: Total Help needed standing up from a chair using your arms (e.g., wheelchair or bedside chair)?: Total Help needed to walk in hospital room?: Total Help needed climbing 3-5 steps with a railing? : Total 6 Click Score: 8    End of Session   Activity Tolerance: Patient tolerated treatment well;Patient limited by fatigue;Patient limited by pain Patient left: in bed;with call bell/phone within reach Nurse Communication: Mobility status PT Visit Diagnosis: Unsteadiness on feet (R26.81);Other abnormalities of gait and mobility (R26.89);Muscle weakness (generalized) (M62.81)     Time: 8250-0370 PT Time Calculation (min) (ACUTE ONLY): 33 min  Charges:  $Therapeutic Exercise: 8-22 mins $Therapeutic Activity: 8-22 mins                     10:03 AM, 03/07/19 Lonell Grandchild, MPT Physical Therapist with [facility] [phone] office [phone] mobile phone

## 2019-03-07 NOTE — TOC Transition Note (Signed)
Transition of Care [facility]) - CM/SW Discharge Note   Patient Details  Name: Denise Washburn MRN: [id number] Date of Birth: 1930/12/28  [facility] CM/SW Contact:  Sherald Barge, RN Phone Number: 03/07/2019, 11:28 AM   Clinical Narrative:   DC cancelled for yesterday. DC today. Son will transport. Facility updated and faxed pt's travel screening.     Final next level of care: [place]     Patient Goals and CMS Choice Patient states their goals for this hospitalization and ongoing recovery are:: be able to return home CMS Medicare.gov Compare Post Acute Care list provided to:: Patient Represenative (must comment)(son and POA Beatris Ship) Choice offered to / list presented to : Adult Children, [place] / [place]  Discharge Placement              Patient chooses bed at: [facility] and [place] Patient to be transferred to facility by: [name]  Name of family member notified: larry Patient and family notified of of transfer: 03/06/19  Discharge Plan and Services   Post Acute Care Choice: [place]                    Social Determinants of Health (SDOH) Interventions     Readmission Risk Interventions Readmission Risk Prevention Plan 03/06/2019  Transportation Screening Complete  PCP or Specialist Appt within 3-5 Days Complete  HRI or [place] Complete  Social Work Consult for [place] Planning/Counseling Complete  Palliative Care Screening Not Applicable  Medication Review Press photographer) Complete

## 2019-03-07 NOTE — Discharge Summary (Signed)
Physician Discharge Summary  Kristen Kaufman NWG:[id number] DOB: 11-11-1931 DOA: 03/01/2019  PCP: Sherrilee Gilles, DO  Admit date: 03/01/2019 Discharge date: 03/07/2019  Time spent: 35 minutes  Recommendations for Outpatient Follow-up:  1. Repeat basic metabolic panel to follow electrolytes and renal function 2. Reassess blood pressure and further adjust antihypertensive regimen as needed. 3. Repeat CBC to follow hemoglobin trend.   Discharge Diagnoses:    Acute lower E. 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  Discharge to skilled nursing facility for further care and rehabilitation.  Patient will need outpatient follow-up with PCP in 2 weeks after discharge from the skilled nursing facility and also follow-up with nephrology service.  Diet recommendation: Heart healthy and modified carbohydrates diet.  Filed Weights   03/01/19 1746 03/01/19 2218  Weight: 122.9 kg 85.1 kg    History of present illness:  As per H&P written by Dr. Nehemiah Settle on 03/01/2019 85 y.o. female with a history of diabetes, hypertension, stage IV chronic kidney disease, chronic hyper kalemia, gout.  Patient seen for increasing suprapubic pain over the past couple of weeks but is acutely worse over the past 3 days.  Patient saw her primary care physician who did some blood work and a urine sample.  She was told that she had a bladder infection, but said that no antibiotics have been prescribed.  She denies fevers, chills, nausea, vomiting.  She does have a cough that is productive with yellow phlegm and gradually worsening.  No palliating or provoking factors.  She also notes generalized fatigue, but no dizziness or  lightheadedness when she is up and moving around.  Emergency Department Course: Chest x-ray shows subtle pneumonia in the right base.  UA significant for UTI.  Hospital Course:  1. Acute lower UTI; due to E. coli microorganism a. Pansensitive as per urine culture results. b. Patient without nausea/vomiting; will continue cefdinir pleat antibiotic therapy. c. Continue supportive care, patient advised to maintain adequate hydration.   d. Physical therapy evaluation appreciated; patient found to be significantly deconditioned and in need of nursing home for rehabilitation.  Family and patient in agreement. Will transfer to skilled nursing facility today. e. No fever, no dysuria. 2. Community-acquired pneumonia a. Continue oral antibiotics at this time. b. Continue as needed antitussives. c. Blood cultures without any growth. d. Follow final sputum cultures e. Strep antigen in urine negative 3. Acute renal failure superimposed on stage IV kidney disease- a. Discussed with patient and family about importance of good hydration. b. Conitnue sodium bicarb tid c. UO adequate with no hypervolemia on examination. d. Continue to follow renal function intermittently and follow-up with nephrologist as an outpatient.Marland Kitchen e. Patient has declined dialysis in the past and currently as well. 4. Anemia in stage IV chronic kidney disease-stable; not interested in hemodialysis a. Iron studies-with no deficiency b. Likely secondary to chronic kidney disease c. Darbopoetin given 3/14 d. No signs of overt bleeding appreciated.   e. Hemoglobin 9.6 after 2 units of PRBCs transfused on 03/04/19 5. Hyperkalemia- a. Patient received Kayexalate 3/14. b. Has remained stable and well-controlled.  Continue to monitor potassium trend. 6. Diabetes-controlled a. Continue home hypoglycemic regimen.  Modify carbohydrate diet has  been discussed with patient.  Outpatient follow-up with PCP to further adjust diabetes  medications as needed. 7. Gout: With left knee/left houlder exacerbation.  Resume colchicine at half dose due to current renal failure. Use tylenol for pain control. 8. Hypertension-stable a. Continue current antihypertensive regimen. b. Heart healthy diet encouraged.  Procedures:  See below for x-ray reports.  Consultations:  None   Discharge Exam: Vitals:   03/06/19 2139 03/07/19 0500  BP: (!) 161/74 (!) 156/67  Pulse: 82 80  Resp: 18 20  Temp: 98.3 F (36.8 C) 98.7 F (37.1 C)  SpO2: 97% 95%    General: Afebrile, no chest pain, no nausea, no vomiting, no abdominal pain or shortness of breath.  Tolerating diet appropriately.  Alert, awake and oriented x3. Cardiovascular: S1 and S2, no rubs, no gallops, no JVD. Respiratory: Positive scattered rhonchi, no crackles, no wheezing, normal respiratory effort. Abdomen: Soft, nontender, nondistended, positive bowel sounds. Extremities: No cyanosis, no clubbing.  Decrease in the swelling and tenderness on her left knee.  Discharge Instructions    Allergies as of 03/07/2019      Reactions   Influenza Vaccines Anaphylaxis   Menactra [meningococcal A C Y&w-135 Conj]       Medication List    STOP taking these medications   methylPREDNISolone 4 MG Tbpk tablet Commonly known as:  MEDROL DOSEPAK     TAKE these medications   acetaminophen 325 MG tablet Commonly known as:  TYLENOL Take 2 tablets (650 mg total) by mouth every 6 (six) hours as needed for fever or headache (pain). What changed:    medication strength  how much to take  when to take this  reasons to take this   amLODipine 10 MG tablet Commonly known as:  NORVASC Take 10 mg by mouth daily.   aspirin 81 MG EC tablet Take 1 tablet (81 mg total) by mouth daily.   cefdinir 300 MG capsule Commonly known as:  OMNICEF Take 1 capsule (300 mg total) by mouth daily for 3 days.   colchicine 0.6 MG tablet Take 0.5 tablets (0.3 mg total) by mouth daily. What  changed:  how much to take   docusate sodium 100 MG capsule Commonly known as:  COLACE Take 1 capsule (100 mg total) by mouth 2 (two) times daily.   patiromer 8.4 g packet Commonly known as:  VELTASSA Take 1 packet (8.4 g total) by mouth daily.   polyethylene glycol packet Commonly known as:  MIRALAX / GLYCOLAX Take 17 g by mouth daily.   QUEtiapine 25 MG tablet Commonly known as:  SEROQUEL Take 0.5 tablets (12.5 mg total) by mouth 2 (two) times daily.   sodium bicarbonate 650 MG tablet Take 1 tablet (650 mg total) by mouth 3 (three) times daily.      Allergies  Allergen Reactions  . Influenza Vaccines Anaphylaxis  . Menactra [Meningococcal A C Y&W-135 Conj]    Follow-up Information    Sherrilee Gilles, DO. Schedule an appointment as soon as possible for a visit in 2 week(s).   Specialty:  Family Medicine Why:  After discharge from the skilled nursing facility. Contact information: [place] [address] [phone]        Arnoldo Lenis, MD .   Specialty:  Cardiology Contact information: [address] [place]  [address] [phone]            The results of significant diagnostics from this hospitalization (including imaging, microbiology, ancillary and laboratory) are listed below  for reference.    Significant Diagnostic Studies: Ct Abdomen Pelvis Wo Contrast  Result Date: 03/01/2019 CLINICAL DATA:  Abdominal pain.  History of hysterectomy. EXAM: CT ABDOMEN AND PELVIS WITHOUT CONTRAST TECHNIQUE: Multidetector CT imaging of the abdomen and pelvis was performed following the standard protocol without IV contrast. COMPARISON:  CT abdomen and pelvis October 30, 2018 FINDINGS: LOWER CHEST: RIGHT middle lobe atelectasis. Included heart size mildly enlarged. Minimal pericardial effusion. HEPATOBILIARY: Punctate layering cholelithiasis without CT findings of acute cholecystitis. Punctate calcific occasion RIGHT lobe of the liver, otherwise  negative non-contrast CT liver. PANCREAS: Normal. SPLEEN: Normal. ADRENALS/URINARY TRACT: Kidneys are orthotopic, demonstrating normal size and morphology. No nephrolithiasis, hydronephrosis; limited assessment for renal masses by nonenhanced CT. Stable 2.4 cm homogeneously hypodense benign-appearing cyst upper pole LEFT kidney. The unopacified ureters are normal in course and caliber. Urinary bladder is partially distended and unremarkable. Normal adrenal glands. STOMACH/BOWEL: The stomach, small and large bowel are normal in course and caliber without inflammatory changes, sensitivity decreased by lack of enteric contrast. Mild stool distended rectum. Moderate colonic diverticulosis. Small, normal appendix. VASCULAR/LYMPHATIC: Aortoiliac vessels are normal in course and caliber. Mild calcific atherosclerosis. No lymphadenopathy by CT size criteria. REPRODUCTIVE: Status post hysterectomy. OTHER: No intraperitoneal free fluid or free air. MUSCULOSKELETAL: Non-acute. Moderate LEFT and mild RIGHT hip osteoarthrosis. Severe L4-5 and L5-S1 degenerative discs. Moderate to severe L4-5 canal stenosis. IMPRESSION: 1. No nephrolithiasis or obstructive uropathy. 2. Diverticulosis and cholelithiasis without acute intra-abdominal/pelvic process. Aortic Atherosclerosis (ICD10-I70.0). Electronically Signed   By: Elon Alas M.D.   On: 03/01/2019 19:46   Dg Chest 2 View  Result Date: 03/03/2019 CLINICAL DATA:  Dyspnea EXAM: CHEST - 2 VIEW COMPARISON:  03/01/2019 and 10/30/2018 FINDINGS: Mild lingular scarring. Additional mild opacities in the bilateral lower lungs favor scarring/atelectasis, especially when correlating with 2019, less likely mild infection. No frank interstitial edema. No pleural effusion or pneumothorax. Cardiomegaly. IMPRESSION: Mild lingular scarring. Suspected bilateral lower lobe scarring/atelectasis. Electronically Signed   By: Julian Hy M.D.   On: 03/03/2019 11:38   Dg Chest 2  View  Result Date: 03/01/2019 CLINICAL DATA:  Pain.  Hypertension. EXAM: CHEST - 2 VIEW COMPARISON:  October 30, 2018 FINDINGS: There is scarring in the lower lung zones bilaterally. There is slight increased opacity in the right base compared to most recent study. Heart is enlarged with pulmonary vascularity normal. No adenopathy. There is aortic atherosclerosis. No bone lesions. IMPRESSION: Scarring in each lower lobe. Question subtle pneumonia superimposed on scarring in the right base. There is stable cardiac prominence. Aortic Atherosclerosis (ICD10-I70.0). Electronically Signed   By: Lowella Grip III M.D.   On: 03/01/2019 19:37    Microbiology: Recent Results (from the past 240 hour(s))  Urine culture     Status: Abnormal   Collection Time: 03/01/19  6:29 PM  Result Value Ref Range Status   Specimen Description   Final    URINE, CATHETERIZED Performed at [facility], [address], [place], [place] [address]    Special Requests   Final    NONE Performed at [facility], [address], [place], [place] [address]    Culture >=100,000 COLONIES/mL ESCHERICHIA COLI (A)  Final   Report Status 03/04/2019 FINAL  Final   Organism ID, Bacteria ESCHERICHIA COLI (A)  Final      Susceptibility   Escherichia coli - MIC*    AMPICILLIN 8 SENSITIVE Sensitive     CEFAZOLIN <=4 SENSITIVE Sensitive     CEFTRIAXONE <=1 SENSITIVE Sensitive  CIPROFLOXACIN <=0.25 SENSITIVE Sensitive     GENTAMICIN <=1 SENSITIVE Sensitive     IMIPENEM <=0.25 SENSITIVE Sensitive     NITROFURANTOIN <=16 SENSITIVE Sensitive     TRIMETH/SULFA <=20 SENSITIVE Sensitive     AMPICILLIN/SULBACTAM 4 SENSITIVE Sensitive     PIP/TAZO <=4 SENSITIVE Sensitive     Extended ESBL NEGATIVE Sensitive     * >=100,000 COLONIES/mL ESCHERICHIA COLI     Labs: Basic Metabolic Panel: Recent Labs  Lab 03/02/19 0027 03/02/19 0652 03/03/19 0834 03/05/19 0439 03/07/19 0425  NA 141 139 137 137 137  K 5.5* 5.5* 4.6 4.1 4.0   CL 118* 118* 111 111 108  CO2 17* 17* 18* 18* 20*  GLUCOSE 108* 93 156* 134* 96  BUN 92* 90* 81* 69* 69*  CREATININE 4.51* 4.19* 3.86* 4.34* 4.14*  CALCIUM 7.7* 7.7* 7.8* 7.7* 7.8*   Liver Function Tests: Recent Labs  Lab 03/01/19 1829  AST 10*  ALT 12  ALKPHOS 129*  BILITOT 0.9  PROT 6.8  ALBUMIN 3.2*   Recent Labs  Lab 03/01/19 1829  LIPASE 42   CBC: Recent Labs  Lab 03/01/19 1829 03/02/19 0652 03/03/19 0834 03/05/19 0439 03/06/19 0548 03/07/19 0425  WBC 9.8 10.3 13.6* 16.1* 15.5* 12.7*  NEUTROABS 6.1  --   --   --   --   --   HGB 7.6* 7.4* 7.1* 6.3* 9.4* 9.5*  HCT 26.0* 25.2* 24.2* 20.3* 29.7* 29.8*  MCV 106.6* 105.9* 106.1* 103.6* 94.6 95.2  PLT 226 228 230 181 199 233   Cardiac Enzymes: Recent Labs  Lab 03/01/19 1829  TROPONINI <0.03   CBG: Recent Labs  Lab 03/06/19 0737 03/06/19 1115 03/06/19 1648 03/06/19 2139 03/07/19 0750  GLUCAP 116* 151* 121* 92 87    Signed:  Barton Dubois MD.  [facility] 03/07/2019, 8:21 AM

## 2019-03-18 ENCOUNTER — Emergency Department (HOSPITAL_COMMUNITY)
Admission: EM | Admit: 2019-03-18 | Discharge: 2019-03-18 | Disposition: A | Discharge status: Home / Self Care | Payer: 59 | Attending: Emergency Medicine | Admitting: Emergency Medicine

## 2019-03-18 ENCOUNTER — Emergency Department (HOSPITAL_COMMUNITY): Payer: 59

## 2019-03-18 ENCOUNTER — Other Ambulatory Visit: Payer: Self-pay

## 2019-03-18 ENCOUNTER — Encounter (HOSPITAL_COMMUNITY): Payer: Self-pay | Admitting: Emergency Medicine

## 2019-03-18 DIAGNOSIS — I2510 Atherosclerotic heart disease of native coronary artery without angina pectoris: Secondary | ICD-10-CM | POA: Diagnosis not present

## 2019-03-18 DIAGNOSIS — I10 Essential (primary) hypertension: Secondary | ICD-10-CM

## 2019-03-18 DIAGNOSIS — Z87891 Personal history of nicotine dependence: Secondary | ICD-10-CM | POA: Insufficient documentation

## 2019-03-18 DIAGNOSIS — M79662 Pain in left lower leg: Secondary | ICD-10-CM | POA: Diagnosis not present

## 2019-03-18 DIAGNOSIS — E1122 Type 2 diabetes mellitus with diabetic chronic kidney disease: Secondary | ICD-10-CM | POA: Insufficient documentation

## 2019-03-18 DIAGNOSIS — R4182 Altered mental status, unspecified: Secondary | ICD-10-CM | POA: Diagnosis not present

## 2019-03-18 DIAGNOSIS — K5900 Constipation, unspecified: Secondary | ICD-10-CM | POA: Diagnosis not present

## 2019-03-18 DIAGNOSIS — M109 Gout, unspecified: Secondary | ICD-10-CM | POA: Insufficient documentation

## 2019-03-18 DIAGNOSIS — N189 Chronic kidney disease, unspecified: Secondary | ICD-10-CM

## 2019-03-18 DIAGNOSIS — I129 Hypertensive chronic kidney disease with stage 1 through stage 4 chronic kidney disease, or unspecified chronic kidney disease: Secondary | ICD-10-CM | POA: Diagnosis not present

## 2019-03-18 DIAGNOSIS — Z7982 Long term (current) use of aspirin: Secondary | ICD-10-CM | POA: Insufficient documentation

## 2019-03-18 DIAGNOSIS — M069 Rheumatoid arthritis, unspecified: Secondary | ICD-10-CM | POA: Diagnosis not present

## 2019-03-18 DIAGNOSIS — Z79899 Other long term (current) drug therapy: Secondary | ICD-10-CM | POA: Insufficient documentation

## 2019-03-18 DIAGNOSIS — N184 Chronic kidney disease, stage 4 (severe): Secondary | ICD-10-CM | POA: Diagnosis not present

## 2019-03-18 DIAGNOSIS — R109 Unspecified abdominal pain: Secondary | ICD-10-CM | POA: Diagnosis present

## 2019-03-18 HISTORY — DX: Schizoaffective disorder, unspecified: F25.9

## 2019-03-18 HISTORY — DX: Difficulty in walking, not elsewhere classified: R26.2

## 2019-03-18 HISTORY — DX: Bipolar disorder, unspecified: F31.9

## 2019-03-18 LAB — CBC
HCT: 35.5 % — ABNORMAL LOW (ref 36.0–46.0)
Hemoglobin: 10.7 g/dL — ABNORMAL LOW (ref 12.0–15.0)
MCH: 29.2 pg (ref 26.0–34.0)
MCHC: 30.1 g/dL (ref 30.0–36.0)
MCV: 96.7 fL (ref 80.0–100.0)
Platelets: 406 10*3/uL — ABNORMAL HIGH (ref 150–400)
RBC: 3.67 MIL/uL — ABNORMAL LOW (ref 3.87–5.11)
RDW: 14.6 % (ref 11.5–15.5)
WBC: 7.4 10*3/uL (ref 4.0–10.5)
nRBC: 0 % (ref 0.0–0.2)

## 2019-03-18 LAB — TROPONIN I: Troponin I: <0.03 ng/mL (ref <0.03)

## 2019-03-18 LAB — COMPREHENSIVE METABOLIC PANEL
ALT: 23 U/L (ref 0–44)
AST: 23 U/L (ref 15–41)
Albumin: 3.5 g/dL (ref 3.5–5.0)
Alkaline Phosphatase: 167 U/L — ABNORMAL HIGH (ref 38–126)
Anion gap: 10 (ref 5–15)
BUN: 65 mg/dL — ABNORMAL HIGH (ref 8–23)
CO2: 22 mmol/L (ref 22–32)
Calcium: 8.3 mg/dL — ABNORMAL LOW (ref 8.9–10.3)
Chloride: 104 mmol/L (ref 98–111)
Creatinine, Ser: 5.1 mg/dL — ABNORMAL HIGH (ref 0.44–1.00)
GFR, EST AFRICAN AMERICAN: 8 mL/min — AB (ref >60)
GFR, EST NON AFRICAN AMERICAN: 7 mL/min — AB (ref >60)
Glucose, Bld: 149 mg/dL — ABNORMAL HIGH (ref 70–99)
Potassium: 5.4 mmol/L — ABNORMAL HIGH (ref 3.5–5.1)
Sodium: 136 mmol/L (ref 135–145)
TOTAL PROTEIN: 7.9 g/dL (ref 6.5–8.1)
Total Bilirubin: 0.5 mg/dL (ref 0.3–1.2)

## 2019-03-18 LAB — URINALYSIS, ROUTINE W REFLEX MICROSCOPIC
Bilirubin Urine: NEGATIVE
Glucose, UA: NEGATIVE mg/dL
Ketones, ur: NEGATIVE mg/dL
Leukocytes,Ua: NEGATIVE
Nitrite: NEGATIVE
PH: 6 (ref 5.0–8.0)
Protein, ur: 100 mg/dL — AB
Specific Gravity, Urine: 1.009 (ref 1.005–1.030)

## 2019-03-18 LAB — LIPASE, BLOOD: Lipase: 23 U/L (ref 11–51)

## 2019-03-18 MED ORDER — SODIUM CHLORIDE 0.9 % IV BOLUS
500.0000 mL | Freq: Once | INTRAVENOUS | Status: AC
  Administered 2019-03-18: 500 mL via INTRAVENOUS

## 2019-03-18 MED ORDER — SODIUM CHLORIDE 0.9% FLUSH
3.0000 mL | Freq: Once | INTRAVENOUS | Status: AC
  Administered 2019-03-18: 3 mL via INTRAVENOUS

## 2019-03-18 MED ORDER — ACETAMINOPHEN 325 MG PO TABS
650.0000 mg | ORAL_TABLET | Freq: Once | ORAL | Status: AC
  Administered 2019-03-18: 650 mg via ORAL
  Filled 2019-03-18: qty 2

## 2019-03-18 MED ORDER — AMLODIPINE BESYLATE 5 MG PO TABS
10.0000 mg | ORAL_TABLET | Freq: Once | ORAL | Status: AC
  Administered 2019-03-18: 10 mg via ORAL
  Filled 2019-03-18: qty 2

## 2019-03-18 NOTE — ED Notes (Signed)
Son on way from [place] to get pt

## 2019-03-18 NOTE — Discharge Instructions (Signed)
Take over the counter laxative (such as miralax, milk of magnesia, senokot) or a dulcolax suppository (or an enema) for your increased stool seen on imaging of your abdomen today. Continue to take your usual prescriptions as previously directed.  Call your regular medical doctor tomorrow to schedule a follow up appointment within the next 2 days. Call your Renal doctor in [place] tomorrow to schedule a follow up appointment within the next week to discuss a long term plan for your elevated renal function tests.  Return to the Emergency Department immediately if worsening.

## 2019-03-18 NOTE — ED Triage Notes (Signed)
Patient states she was recently hospitalized for kidney failure. Began having left sided pain last night. Patient able to move all extremities.

## 2019-03-18 NOTE — ED Provider Notes (Signed)
[facility] EMERGENCY DEPARTMENT Provider Note   CSN: [id number] Arrival date & time: 03/18/19  1426    History   Chief Complaint Chief Complaint  Patient presents with   Acute Renal Failure    HPI Kristen Kaufman is a 85 y.o. female.        Pt was seen at 1555. Per pt, c/o gradual onset and persistence of constant "left sided pain" since yesterday. Pt states the pain is located in the "left side of her body." When asked where specifically she is having pain she states her left abd and left knee. Pt states she was admitted to the hospital 2.5 weeks ago for these same symptoms and "it never went away." Pt is unsure what she was dx with while hospitalized, only repeating "my kidneys are failing" and she was "here for emergency dialysis."  Pt states she has a Nephrologist in [place] and initially did not want HD, but states after talking with family "now I want it."  Denies CP/palpitations, no SOB/cough, no fevers, no rash, no focal motor weakness, no tingling/numbness in extremities, no back pain.      Past Medical History:  Diagnosis Date   Chronic back pain    Diabetes mellitus without complication ([place])    Gout    Hypertension    Myocardial infarct [facility])    Ulcer    stomach    Patient Active Problem List   Diagnosis Date Noted   CAP (community acquired pneumonia) 03/01/2019   Acute lower UTI 03/01/2019   Rheumatoid arthritis ([place]) 06/12/2018   Chest pain 06/12/2018   Gout 02/13/2018   CAD (coronary artery disease) 02/13/2018   Constipation 06/13/2017   AKI (acute kidney injury) ([place]) 05/30/2017   UTI (urinary tract infection), uncomplicated 78/93/8101   UTI (urinary tract infection) 05/25/2017   Hyperkalemia 05/25/2017   Acute renal failure superimposed on stage 4 chronic kidney disease (HCC)    Anemia in stage 4 chronic kidney disease ([place])    Renal insufficiency 05/24/2017   Diabetes mellitus type 2 in obese ([place]) 05/24/2017   Essential  hypertension 05/24/2017    Past Surgical History:  Procedure Laterality Date   ABDOMINAL HYSTERECTOMY       OB History    Gravida  6   Para  6   Term  6   Preterm      AB      Living  3     SAB      TAB      Ectopic      Multiple      Live Births               Home Medications    Prior to Admission medications   Medication Sig Start Date End Date Taking? Authorizing Provider  acetaminophen (TYLENOL) 325 MG tablet Take 2 tablets (650 mg total) by mouth every 6 (six) hours as needed for fever or headache (pain). 03/07/19   Barton Dubois, MD  amLODipine (NORVASC) 10 MG tablet Take 10 mg by mouth daily.    [provider]  aspirin EC 81 MG EC tablet Take 1 tablet (81 mg total) by mouth daily. 06/12/18   Johnson, Clanford L, MD  colchicine 0.6 MG tablet Take 0.5 tablets (0.3 mg total) by mouth daily. 03/07/19   Barton Dubois, MD  docusate sodium (COLACE) 100 MG capsule Take 1 capsule (100 mg total) by mouth 2 (two) times daily. 03/07/19   Barton Dubois, MD  patiromer Daryll Drown)  8.4 g packet Take 1 packet (8.4 g total) by mouth daily. 03/07/19   Barton Dubois, MD  polyethylene glycol [facility] Floria Raveling) packet Take 17 g by mouth daily. 03/07/19   Barton Dubois, MD  QUEtiapine (SEROQUEL) 25 MG tablet Take 0.5 tablets (12.5 mg total) by mouth 2 (two) times daily. 03/07/19   Barton Dubois, MD  sodium bicarbonate 650 MG tablet Take 1 tablet (650 mg total) by mouth 3 (three) times daily. 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 ; ; Eyes: Negative for eye pain, redness and discharge. ; ; ENMT: Negative for ear pain, hoarseness, nasal congestion, sinus pressure and sore throat. ; ; Cardiovascular: Negative for chest pain, palpitations, diaphoresis, dyspnea and peripheral edema. ; ; Respiratory: Negative for cough, wheezing and stridor. ; ; Gastrointestinal: +abd pain. Negative for nausea, vomiting, diarrhea, blood in stool, hematemesis, jaundice and rectal bleeding. . ; ; Genitourinary: Negative for dysuria, flank pain and hematuria. ; ; Musculoskeletal: +left knee pain. Negative for back pain and neck pain. Negative for swelling and trauma.; ; Skin: Negative for pruritus, rash, abrasions, blisters, bruising and skin lesion.; ; Neuro: Negative for headache, lightheadedness and neck stiffness. Negative for weakness, altered level of consciousness, altered mental status, extremity weakness, paresthesias, involuntary movement, seizure and syncope.       Physical Exam Updated Vital Signs BP (!) 131/119 (BP Location: Right Arm)    Pulse 92    Temp 98.5 F (36.9 C) (Oral)    Resp (!) 26    Ht 5\' 1"  (1.549 m)    Wt 77.1 kg    SpO2 91%    BMI 32.12 kg/m    Patient Vitals for the past 24 hrs:  BP Temp Temp src Pulse Resp SpO2 Height Weight  03/18/19 1830 (!) 182/82 -- -- 87 -- 92 % -- --  03/18/19 1815 -- -- -- 87 -- 94 % -- --  03/18/19 1800 (!) 186/91 -- -- -- -- -- -- --  03/18/19 1742 (!) 172/95 -- -- 88 16 97 % -- --  03/18/19 1700 (!) 197/94 -- -- 71 17 -- -- --  03/18/19 1441 (!) 131/119 98.5 F (36.9 C) Oral 92 20 91 % -- --  03/18/19 1435 -- -- -- -- -- -- 5\' 1"  (1.549 m) 77.1 kg     Physical Exam 1600: Physical  examination:  Nursing notes reviewed; Vital signs and O2 SAT reviewed;  Constitutional: Well developed, Well nourished, Well hydrated, In no acute distress; Head:  Normocephalic, atraumatic; Eyes: EOMI, PERRL, No scleral icterus; ENMT: Mouth and pharynx normal, Mucous membranes dry; Neck: Supple,  Full range of motion, No lymphadenopathy; Cardiovascular: Regular rate and rhythm, No gallop; Respiratory: Breath sounds clear & equal bilaterally, No wheezes.  Speaking full sentences with ease, Normal respiratory effort/excursion; Chest: Nontender, Movement normal; Abdomen: Soft, +mild LLQ tenderness to palp. No rebound or guarding. Nondistended, Normal bowel sounds; Genitourinary: No CVA tenderness; Spine:  No midline CS, TS, LS tenderness.;; Extremities: Peripheral pulses normal, +generalized TTP left knee with localized edema, no rash. +FROM left knee, including able to lift extended LLE off stretcher, and extend left lower leg against resistance.  No ligamentous laxity.  No patellar or quad tendon step-offs.  NMS intact left foot, strong pedal pp. +plantarflexion of left foot w/calf squeeze.  No palpable gap left Achilles's tendon.  No proximal fibular head tenderness.  No edema, erythema, warmth, ecchymosis or deformity.  NT left hip/ankle/foot. +tr pedal edema bilat feet to ankles. No calf tenderness, edema or asymmetry.; Neuro: AA&Ox3. No facial droop. Major CN grossly intact.  Speech clear. Grips equal. Strength 5/5 equal bilat UE's and LE's. No apparent gross focal motor or sensory deficits in extremities.; Skin: Color normal, Warm, Dry.; Psych:  Anxious, easily agitated.     ED Treatments / Results  Labs (all labs ordered are listed, but only abnormal results are displayed)   EKG EKG Interpretation  Date/Time:  Monday March 18 2019 14:37:59 EDT Ventricular Rate:  96 PR Interval:    QRS Duration: 99 QT Interval:  387 QTC Calculation: 490 R Axis:   -30 Text Interpretation:  Sinus rhythm Abnormal  R-wave progression, early transition Left axis deviation Inferior infarct, old Baseline wander Artifact When compared with ECG of 03/01/2019 No significant change was found Confirmed by Francine Graven [id number]) on 03/18/2019 4:07:47 PM   Radiology     Procedures Procedures (including critical care time)  Medications Ordered in ED Medications  acetaminophen (TYLENOL) tablet 650 mg (has no administration in time range)  sodium chloride 0.9 % bolus 500 mL (has no administration in time range)  sodium chloride flush (NS) 0.9 % injection 3 mL (3 mLs Intravenous Given 03/18/19 1505)     Initial Impression / Assessment and Plan / ED Course  I have reviewed the triage vital signs and the nursing notes.  Pertinent labs & imaging results that were available during my care of the patient were reviewed by me and considered in my medical decision making (see chart for details).       MDM Reviewed: previous chart, nursing note and vitals Reviewed previous: labs and ECG Interpretation: labs, ECG, x-ray, ultrasound and CT scan    Results for orders placed or performed during the hospital encounter of 03/18/19  Lipase, blood  Result Value Ref Range   Lipase 23 11 - 51 U/L  Comprehensive metabolic panel  Result Value Ref Range   Sodium 136 135 - 145 mmol/L   Potassium 5.4 (H) 3.5 - 5.1 mmol/L   Chloride 104 98 - 111 mmol/L   CO2 22 22 - 32 mmol/L   Glucose, Bld 149 (H) 70 - 99 mg/dL   BUN 65 (H) 8 - 23 mg/dL   Creatinine, Ser 5.10 (H) 0.44 - 1.00 mg/dL   Calcium 8.3 (L) 8.9 - 10.3 mg/dL   Total Protein 7.9 6.5 - 8.1 g/dL   Albumin 3.5 3.5 - 5.0 g/dL   AST 23 15 - 41 U/L   ALT 23 0 - 44 U/L   Alkaline Phosphatase 167 (H) 38 - 126 U/L   Total Bilirubin 0.5 0.3 -  1.2 mg/dL   GFR calc non Af Amer 7 (L) >60 mL/min   GFR calc Af Amer 8 (L) >60 mL/min   Anion gap 10 5 - 15  CBC  Result Value Ref Range   WBC 7.4 4.0 - 10.5 K/uL   RBC 3.67 (L) 3.87 - 5.11 MIL/uL   Hemoglobin 10.7 (L)  12.0 - 15.0 g/dL   HCT 35.5 (L) 36.0 - 46.0 %   MCV 96.7 80.0 - 100.0 fL   MCH 29.2 26.0 - 34.0 pg   MCHC 30.1 30.0 - 36.0 g/dL   RDW 14.6 11.5 - 15.5 %   Platelets 406 (H) 150 - 400 K/uL   nRBC 0.0 0.0 - 0.2 %  Urinalysis, Routine w reflex microscopic  Result Value Ref Range   Color, Urine YELLOW YELLOW   APPearance CLEAR CLEAR   Specific Gravity, Urine 1.009 1.005 - 1.030   pH 6.0 5.0 - 8.0   Glucose, UA NEGATIVE NEGATIVE mg/dL   Hgb urine dipstick SMALL (A) NEGATIVE   Bilirubin Urine NEGATIVE NEGATIVE   Ketones, ur NEGATIVE NEGATIVE mg/dL   Protein, ur 100 (A) NEGATIVE mg/dL   Nitrite NEGATIVE NEGATIVE   Leukocytes,Ua NEGATIVE NEGATIVE   RBC / HPF 0-5 0 - 5 RBC/hpf   WBC, UA 0-5 0 - 5 WBC/hpf   Bacteria, UA RARE (A) NONE SEEN   Squamous Epithelial / LPF 0-5 0 - 5   Mucus PRESENT   Troponin I - Once  Result Value Ref Range   Troponin I <0.03 <0.03 ng/mL     Dg Chest 2 View Result Date: 03/18/2019 CLINICAL DATA:  Recent hospitalization for kidney failure. Left-sided chest pain beginning last night. EXAM: CHEST - 2 VIEW COMPARISON:  03/03/2019 FINDINGS: Lungs are somewhat hypoinflated with minimal stable linear density left midlung likely atelectasis/scarring. Opacification over the mid to posterior lung bases on the lateral film which may be due to atelectasis or infection. Mild stable cardiomegaly. Remainder of the exam is unchanged. IMPRESSION: Mild opacification over the mid to posterior lung bases on the lateral film which may be due to atelectasis or infection. Electronically Signed   By: Marin Olp M.D.   On: 03/18/2019 16:33    Dg Knee Complete 4 Views Left Result Date: 03/18/2019 CLINICAL DATA:  Left knee pain. EXAM: LEFT KNEE - COMPLETE 4+ VIEW COMPARISON:  02/15/2018 FINDINGS: Moderate osteoarthritic change of the left knee most prominent over the medial compartment with slight interval progression. No evidence of acute fracture or dislocation. No significant joint  effusion. IMPRESSION: No acute findings. Moderate osteoarthritis most prominent over the medial compartment with slight interval progression. Electronically Signed   By: Marin Olp M.D.   On: 03/18/2019 16:31    Ct Abdomen Pelvis Wo Contrast Result Date: 03/18/2019 CLINICAL DATA:  Left-sided abdominal pain. EXAM: CT ABDOMEN AND PELVIS WITHOUT CONTRAST TECHNIQUE: Multidetector CT imaging of the abdomen and pelvis was performed following the standard protocol without IV contrast. COMPARISON:  CT abdomen pelvis dated March 01, 2019. FINDINGS: Lower chest: New bibasilar atelectasis. Unchanged trace pericardial effusion. Hepatobiliary: No focal liver abnormality. Unchanged cholelithiasis. No biliary dilatation. Pancreas: Unremarkable. No pancreatic ductal dilatation or surrounding inflammatory changes. Spleen: Normal in size without focal abnormality. Adrenals/Urinary Tract: Adrenal glands are unremarkable. Unchanged simple cyst in the upper pole the left kidney. No renal calculi or hydronephrosis. Bladder is unremarkable. Stomach/Bowel: Stomach is within normal limits. Appendix is normal. No evidence of bowel wall thickening, distention, or inflammatory changes. Unchanged moderate colonic  diverticulosis. Mild stool burden throughout the colon. Increasing stool ball in the rectum. Vascular/Lymphatic: Aortic atherosclerosis. No enlarged abdominal or pelvic lymph nodes. Reproductive: Status post hysterectomy. No adnexal masses. Other: New mild presacral soft tissue stranding, nonspecific. No free fluid or pneumoperitoneum. Musculoskeletal: No acute or significant osseous findings. IMPRESSION: 1. Increasing stool ball within the rectum with new presacral soft tissue stranding, potentially reflecting stercoral proctitis. 2. Unchanged cholelithiasis. 3.  Aortic atherosclerosis (ICD10-I70.0). Electronically Signed   By: Titus Dubin M.D.   On: 03/18/2019 17:51    Ct Head Wo Contrast Result Date:  03/18/2019 CLINICAL DATA:  Recently hospitalized for kidney failure. Altered mental status. EXAM: CT HEAD WITHOUT CONTRAST TECHNIQUE: Contiguous axial images were obtained from the base of the skull through the vertex without intravenous contrast. COMPARISON:  None. FINDINGS: Brain: Cavum septum variant is present. There is mild age related atrophic change as the ventricles and cisterns are otherwise unremarkable. There is moderate chronic ischemic microvascular disease. There is no mass, mass effect, shift of midline structures or acute hemorrhage. Small old lacunar infarct over the left basal ganglia. No acute infarction. Vascular: No hyperdense vessel or unexpected calcification. Skull: Normal. Negative for fracture or focal lesion. Sinuses/Orbits: Orbits are normal. Mild opacification over the right maxillary sinus. Mastoid air cells are clear. Other: None. IMPRESSION: No acute findings. Moderate chronic ischemic microvascular disease. Small old lacunar infarct left basal ganglia. Age related atrophy. Minimal chronic sinus inflammatory change. Electronically Signed   By: Marin Olp M.D.   On: 03/18/2019 17:41    US Venous Img Lower  Left (dvt Study) Result Date: 03/18/2019 CLINICAL DATA:  85 year old female with leg pain EXAM: LEFT LOWER EXTREMITY VENOUS DOPPLER ULTRASOUND TECHNIQUE: Gray-scale sonography with graded compression, as well as color Doppler and duplex ultrasound were performed to evaluate the lower extremity deep venous systems from the level of the common femoral vein and including the common femoral, femoral, profunda femoral, popliteal and calf veins including the posterior tibial, peroneal and gastrocnemius veins when visible. The superficial great saphenous vein was also interrogated. Spectral Doppler was utilized to evaluate flow at rest and with distal augmentation maneuvers in the common femoral, femoral and popliteal veins. COMPARISON:  None. FINDINGS: Contralateral Common Femoral  Vein: Respiratory phasicity is normal and symmetric with the symptomatic side. No evidence of thrombus. Normal compressibility. Common Femoral Vein: No evidence of thrombus. Normal compressibility, respiratory phasicity and response to augmentation. Saphenofemoral Junction: No evidence of thrombus. Normal compressibility and flow on color Doppler imaging. Profunda Femoral Vein: No evidence of thrombus. Normal compressibility and flow on color Doppler imaging. Femoral Vein: No evidence of thrombus. Normal compressibility, respiratory phasicity and response to augmentation. Popliteal Vein: No evidence of thrombus. Normal compressibility, respiratory phasicity and response to augmentation. Calf Veins: No evidence of thrombus. Normal compressibility and flow on color Doppler imaging. Superficial Great Saphenous Vein: No evidence of thrombus. Normal compressibility and flow on color Doppler imaging. Other Findings:  Edema IMPRESSION: Sonographic survey of the left lower extremity negative for DVT. Left lower extremity edema Electronically Signed   By: Corrie Mckusick D.O.   On: 03/18/2019 17:07    Results for LATRELLE, FUSTON (MRN [id number]) as of 03/18/2019 16:27  Ref. Range 03/01/2019 18:29 03/02/2019 00:27 03/02/2019 06:52 03/03/2019 08:34 03/05/2019 04:39 03/07/2019 04:25 03/18/2019 14:50  BUN Latest Ref Range: 8 - 23 mg/dL 97 (H) 92 (H) 90 (H) 81 (H) 69 (H) 69 (H) 65 (H)  Creatinine Latest Ref Range: 0.44 - 1.00 mg/dL 4.73 (H)  4.51 (H) 4.19 (H) 3.86 (H) 4.34 (H) 4.14 (H) 5.10 (H)    Results for JAZMON, KOS (MRN [id number]) as of 03/18/2019 16:27  Ref. Range 03/02/2019 06:52 03/03/2019 08:34 03/05/2019 04:39 03/06/2019 05:48 03/07/2019 04:25 03/18/2019 14:50  Hemoglobin Latest Ref Range: 12.0 - 15.0 g/dL 7.4 (L) 7.1 (L) 6.3 (LL) 9.4 (L) 9.5 (L) 10.7 (L)  HCT Latest Ref Range: 36.0 - 46.0 % 25.2 (L) 24.2 (L) 20.3 (L) 29.7 (L) 29.8 (L) 35.5 (L)    1830:  Pt's complaints today were addressed while she was hospitalized; pt  reminded she was dx with gout as cause for her knee pain. Pt stated she "didn't remember that" and is persistently focused on her "kidneys."  Epic chart reviewed: pt did have several days of agitation and anxiousness during her hospitalization while dx with gout; likely the reason why pt does not fully remember dx.  Judicious IVF bolus given for mildly elevated BUN/Cr from baseline. H/H per baseline. No UTI on Udip. Pt did not take her BP meds today per NH; dose given in ED.  ED RN and I spoke with pt regarding CT scan with stool ball in rectum and need for rectal exam/disimpaction. Pt refuses, states she would rather get an enema or laxative at [facility].   1835:  T/C returned from Renal Dr. Justin Mend, case discussed, including:  HPI, pertinent PM/SHx, VS/PE, dx testing, ED course and treatment:  No need to tx potassium level further at this time, no indication for admission or emergent HD, pt can be d/c back to NH with NH MD or PMD to discuss with pt's Renal MD in [facility] regarding tx plan for pt's CKD.    1910:  Pt has tol PO well without N/V.  Abd remains benign, resps easy. Home BP med given. Dx and testing, as well as d/w Renal MD, d/w pt.  Questions answered.  Verb understanding, agreeable to d/c back to NH with outpt f/u.     Final Clinical Impressions(s) / ED Diagnoses   Final diagnoses:  None    ED Discharge Orders    None       Francine Graven, DO 03/20/19 1703

## 2019-03-18 NOTE — ED Notes (Signed)
Pt unable to sign. Discharge given to son.

## 2019-03-18 NOTE — ED Notes (Signed)
Pt drank fluids without any problems

## 2019-03-20 LAB — URINE CULTURE: Culture: >=100000 — AB

## 2019-03-21 ENCOUNTER — Telehealth: Payer: Self-pay

## 2019-03-21 NOTE — Telephone Encounter (Signed)
No treatment for UC ED 03/18/2019 per [facility]

## 2019-03-21 NOTE — Progress Notes (Signed)
ED Antimicrobial Stewardship Positive Culture Follow Up   Kristen Kaufman is an 85 y.o. female who presented to [facility] on 03/18/2019 with a chief complaint of  Chief Complaint  Patient presents with  . Acute Renal Failure    Recent Results (from the past 720 hour(s))  Urine culture     Status: Abnormal   Collection Time: 03/01/19  6:29 PM  Result Value Ref Range Status   Specimen Description   Final    URINE, CATHETERIZED Performed at [facility], [address] [place], [place] [address]    Special Requests   Final    NONE Performed at [facility], [address], [place], [place] [address]    Culture >=100,000 COLONIES/mL ESCHERICHIA COLI (A)  Final   Report Status 03/04/2019 FINAL  Final   Organism ID, Bacteria ESCHERICHIA COLI (A)  Final      Susceptibility   Escherichia coli - MIC*    AMPICILLIN 8 SENSITIVE Sensitive     CEFAZOLIN <=4 SENSITIVE Sensitive     CEFTRIAXONE <=1 SENSITIVE Sensitive     CIPROFLOXACIN <=0.25 SENSITIVE Sensitive     GENTAMICIN <=1 SENSITIVE Sensitive     IMIPENEM <=0.25 SENSITIVE Sensitive     NITROFURANTOIN <=16 SENSITIVE Sensitive     TRIMETH/SULFA <=20 SENSITIVE Sensitive     AMPICILLIN/SULBACTAM 4 SENSITIVE Sensitive     PIP/TAZO <=4 SENSITIVE Sensitive     Extended ESBL NEGATIVE Sensitive     * >=100,000 COLONIES/mL ESCHERICHIA COLI  Urine culture     Status: Abnormal   Collection Time: 03/18/19  5:28 PM  Result Value Ref Range Status   Specimen Description   Final    URINE, CATHETERIZED Performed at [facility], [address], [place], [place] [address]    Special Requests   Final    NONE Performed at [facility], [address], [place], [place] [address]    Culture >=100,000 COLONIES/mL ENTEROCOCCUS FAECALIS (A)  Final   Report Status 03/20/2019 FINAL  Final   Organism ID, Bacteria ENTEROCOCCUS FAECALIS (A)  Final      Susceptibility   Enterococcus faecalis - MIC*    AMPICILLIN <=2 SENSITIVE Sensitive     LEVOFLOXACIN 1  SENSITIVE Sensitive     NITROFURANTOIN <=16 SENSITIVE Sensitive     VANCOMYCIN 1 SENSITIVE Sensitive     * >=100,000 COLONIES/mL ENTEROCOCCUS FAECALIS   Culture results reflective of ASB as pt does not report any urinary symptoms. No treatment needed.  ED Provider: Lenn Sink PA   Juanell Fairly, PharmD PGY1 Pharmacy Resident 03/21/2019 8:34 AM Clinical Pharmacist

## 2019-04-01 ENCOUNTER — Inpatient Hospital Stay (HOSPITAL_COMMUNITY)
Admission: EM | Admit: 2019-04-01 | Discharge: 2019-04-07 | DRG: 682 | Disposition: A | Discharge status: Hospice | Payer: 59 | Attending: Internal Medicine | Admitting: Internal Medicine

## 2019-04-01 ENCOUNTER — Encounter (HOSPITAL_COMMUNITY): Payer: Self-pay | Admitting: Emergency Medicine

## 2019-04-01 ENCOUNTER — Other Ambulatory Visit: Payer: Self-pay

## 2019-04-01 ENCOUNTER — Emergency Department (HOSPITAL_COMMUNITY): Payer: 59

## 2019-04-01 DIAGNOSIS — F319 Bipolar disorder, unspecified: Secondary | ICD-10-CM | POA: Diagnosis present

## 2019-04-01 DIAGNOSIS — N186 End stage renal disease: Secondary | ICD-10-CM

## 2019-04-01 DIAGNOSIS — E875 Hyperkalemia: Secondary | ICD-10-CM | POA: Diagnosis present

## 2019-04-01 DIAGNOSIS — Z8249 Family history of ischemic heart disease and other diseases of the circulatory system: Secondary | ICD-10-CM

## 2019-04-01 DIAGNOSIS — D72829 Elevated white blood cell count, unspecified: Secondary | ICD-10-CM | POA: Diagnosis not present

## 2019-04-01 DIAGNOSIS — K6289 Other specified diseases of anus and rectum: Secondary | ICD-10-CM | POA: Diagnosis present

## 2019-04-01 DIAGNOSIS — Z8673 Personal history of transient ischemic attack (TIA), and cerebral infarction without residual deficits: Secondary | ICD-10-CM

## 2019-04-01 DIAGNOSIS — Z7982 Long term (current) use of aspirin: Secondary | ICD-10-CM | POA: Diagnosis not present

## 2019-04-01 DIAGNOSIS — K8020 Calculus of gallbladder without cholecystitis without obstruction: Secondary | ICD-10-CM | POA: Diagnosis present

## 2019-04-01 DIAGNOSIS — I2510 Atherosclerotic heart disease of native coronary artery without angina pectoris: Secondary | ICD-10-CM | POA: Diagnosis present

## 2019-04-01 DIAGNOSIS — N19 Unspecified kidney failure: Secondary | ICD-10-CM | POA: Diagnosis present

## 2019-04-01 DIAGNOSIS — I252 Old myocardial infarction: Secondary | ICD-10-CM

## 2019-04-01 DIAGNOSIS — N179 Acute kidney failure, unspecified: Secondary | ICD-10-CM | POA: Diagnosis present

## 2019-04-01 DIAGNOSIS — L8990 Pressure ulcer of unspecified site, unspecified stage: Secondary | ICD-10-CM | POA: Diagnosis present

## 2019-04-01 DIAGNOSIS — G9341 Metabolic encephalopathy: Secondary | ICD-10-CM | POA: Diagnosis present

## 2019-04-01 DIAGNOSIS — I10 Essential (primary) hypertension: Secondary | ICD-10-CM | POA: Diagnosis present

## 2019-04-01 DIAGNOSIS — Z515 Encounter for palliative care: Secondary | ICD-10-CM | POA: Diagnosis present

## 2019-04-01 DIAGNOSIS — I120 Hypertensive chronic kidney disease with stage 5 chronic kidney disease or end stage renal disease: Principal | ICD-10-CM | POA: Diagnosis present

## 2019-04-01 DIAGNOSIS — Z79899 Other long term (current) drug therapy: Secondary | ICD-10-CM | POA: Diagnosis not present

## 2019-04-01 DIAGNOSIS — Z66 Do not resuscitate: Secondary | ICD-10-CM | POA: Diagnosis present

## 2019-04-01 DIAGNOSIS — E1122 Type 2 diabetes mellitus with diabetic chronic kidney disease: Secondary | ICD-10-CM | POA: Diagnosis present

## 2019-04-01 DIAGNOSIS — I700 Atherosclerosis of aorta: Secondary | ICD-10-CM | POA: Diagnosis present

## 2019-04-01 DIAGNOSIS — F259 Schizoaffective disorder, unspecified: Secondary | ICD-10-CM | POA: Diagnosis present

## 2019-04-01 DIAGNOSIS — M069 Rheumatoid arthritis, unspecified: Secondary | ICD-10-CM | POA: Diagnosis present

## 2019-04-01 DIAGNOSIS — Z7189 Other specified counseling: Secondary | ICD-10-CM

## 2019-04-01 DIAGNOSIS — Z888 Allergy status to other drugs, medicaments and biological substances status: Secondary | ICD-10-CM

## 2019-04-01 DIAGNOSIS — M549 Dorsalgia, unspecified: Secondary | ICD-10-CM | POA: Diagnosis present

## 2019-04-01 DIAGNOSIS — N185 Chronic kidney disease, stage 5: Secondary | ICD-10-CM

## 2019-04-01 DIAGNOSIS — G8929 Other chronic pain: Secondary | ICD-10-CM | POA: Diagnosis present

## 2019-04-01 DIAGNOSIS — Z887 Allergy status to serum and vaccine status: Secondary | ICD-10-CM

## 2019-04-01 DIAGNOSIS — E872 Acidosis: Secondary | ICD-10-CM | POA: Diagnosis present

## 2019-04-01 DIAGNOSIS — E669 Obesity, unspecified: Secondary | ICD-10-CM | POA: Diagnosis present

## 2019-04-01 DIAGNOSIS — Z6831 Body mass index (BMI) 31.0-31.9, adult: Secondary | ICD-10-CM

## 2019-04-01 DIAGNOSIS — Z87891 Personal history of nicotine dependence: Secondary | ICD-10-CM

## 2019-04-01 DIAGNOSIS — M109 Gout, unspecified: Secondary | ICD-10-CM | POA: Diagnosis present

## 2019-04-01 DIAGNOSIS — Z841 Family history of disorders of kidney and ureter: Secondary | ICD-10-CM

## 2019-04-01 LAB — BASIC METABOLIC PANEL
Anion gap: 12 (ref 5–15)
BUN: 120 mg/dL — ABNORMAL HIGH (ref 8–23)
CO2: 24 mmol/L (ref 22–32)
Calcium: 8.6 mg/dL — ABNORMAL LOW (ref 8.9–10.3)
Chloride: 105 mmol/L (ref 98–111)
Creatinine, Ser: 7.49 mg/dL — ABNORMAL HIGH (ref 0.44–1.00)
GFR calc Af Amer: 5 mL/min — ABNORMAL LOW (ref >60)
GFR calc non Af Amer: 4 mL/min — ABNORMAL LOW (ref >60)
Glucose, Bld: 109 mg/dL — ABNORMAL HIGH (ref 70–99)
Potassium: 4.8 mmol/L (ref 3.5–5.1)
Sodium: 141 mmol/L (ref 135–145)

## 2019-04-01 LAB — MRSA PCR SCREENING: MRSA by PCR: POSITIVE — AB

## 2019-04-01 LAB — CBC WITH DIFFERENTIAL/PLATELET
Abs Immature Granulocytes: 0.09 10*3/uL — ABNORMAL HIGH (ref 0.00–0.07)
Basophils Absolute: 0 10*3/uL (ref 0.0–0.1)
Basophils Relative: 0 %
Eosinophils Absolute: 0.2 10*3/uL (ref 0.0–0.5)
Eosinophils Relative: 1 %
HCT: 36.4 % (ref 36.0–46.0)
Hemoglobin: 10.7 g/dL — ABNORMAL LOW (ref 12.0–15.0)
Immature Granulocytes: 1 %
Lymphocytes Relative: 14 %
Lymphs Abs: 1.8 10*3/uL (ref 0.7–4.0)
MCH: 29.2 pg (ref 26.0–34.0)
MCHC: 29.4 g/dL — ABNORMAL LOW (ref 30.0–36.0)
MCV: 99.5 fL (ref 80.0–100.0)
Monocytes Absolute: 1.3 10*3/uL — ABNORMAL HIGH (ref 0.1–1.0)
Monocytes Relative: 10 %
Neutro Abs: 9.7 10*3/uL — ABNORMAL HIGH (ref 1.7–7.7)
Neutrophils Relative %: 74 %
Platelets: 284 10*3/uL (ref 150–400)
RBC: 3.66 MIL/uL — ABNORMAL LOW (ref 3.87–5.11)
RDW: 15.1 % (ref 11.5–15.5)
WBC: 13.1 10*3/uL — ABNORMAL HIGH (ref 4.0–10.5)
nRBC: 0 % (ref 0.0–0.2)

## 2019-04-01 MED ORDER — ACETAMINOPHEN 325 MG PO TABS
650.0000 mg | ORAL_TABLET | Freq: Four times a day (QID) | ORAL | Status: DC | PRN
  Administered 2019-04-04: 650 mg via ORAL
  Filled 2019-04-01: qty 2

## 2019-04-01 MED ORDER — ONDANSETRON HCL 4 MG PO TABS
4.0000 mg | ORAL_TABLET | Freq: Four times a day (QID) | ORAL | Status: DC | PRN
  Administered 2019-04-03 (×2): 4 mg via ORAL
  Filled 2019-04-01 (×3): qty 1

## 2019-04-01 MED ORDER — MUPIROCIN 2 % EX OINT
1.0000 "application " | TOPICAL_OINTMENT | Freq: Two times a day (BID) | CUTANEOUS | Status: AC
  Administered 2019-04-02 – 2019-04-06 (×10): 1 via NASAL
  Filled 2019-04-01 (×2): qty 22

## 2019-04-01 MED ORDER — SODIUM CHLORIDE 0.9% FLUSH
3.0000 mL | Freq: Two times a day (BID) | INTRAVENOUS | Status: DC
  Administered 2019-04-02 – 2019-04-06 (×4): 3 mL via INTRAVENOUS

## 2019-04-01 MED ORDER — ONDANSETRON HCL 4 MG/2ML IJ SOLN: 4.0000 mg | Freq: Four times a day (QID) | INTRAMUSCULAR | Status: DC | PRN

## 2019-04-01 MED ORDER — ACETAMINOPHEN 650 MG RE SUPP: 650.0000 mg | Freq: Four times a day (QID) | RECTAL | Status: DC | PRN

## 2019-04-01 MED ORDER — CHLORHEXIDINE GLUCONATE CLOTH 2 % EX PADS
6.0000 | MEDICATED_PAD | Freq: Every day | CUTANEOUS | Status: DC
  Administered 2019-04-02 – 2019-04-06 (×4): 6 via TOPICAL

## 2019-04-01 MED ORDER — AMLODIPINE BESYLATE 5 MG PO TABS
10.0000 mg | ORAL_TABLET | Freq: Every day | ORAL | Status: DC
  Administered 2019-04-02: 09:00:00 10 mg via ORAL
  Filled 2019-04-01: qty 2

## 2019-04-01 MED ORDER — SODIUM CHLORIDE 0.9 % IV SOLN
INTRAVENOUS | Status: DC
  Administered 2019-04-01: 23:00:00 via INTRAVENOUS

## 2019-04-01 NOTE — ED Notes (Signed)
Lab at the bedside 

## 2019-04-01 NOTE — ED Notes (Signed)
Will attempt to start iv when hospitalist finishes assessment

## 2019-04-01 NOTE — H&P (Signed)
History and Physical    Kristen Kaufman NWG:[id number] DOB: 1931-07-27 DOA: 04/01/2019  PCP: Sherrilee Gilles, DO   Patient coming from: Nursing home   Chief Complaint: Abnormal labs, worsening confusion   HPI: Kristen Kaufman is a 85 y.o. female with medical history significant for chronic kidney disease stage V not yet on dialysis, history of diabetes mellitus no longer requiring treatment, hypertension, and possible bipolar disorder or schizoaffective disorder, now presenting to emergency department for evaluation of worsening confusion and abnormal blood work.  Patient has been following with nephrology for progressive CKD, had initially declined dialysis and had been noncompliant with her therapies per report, but more recently had stated that she did want to proceed with dialysis as documented in ED physician notes from 03/18/2019.  Per report of family, she has become increasingly confused, was evaluated with outpatient blood work, was found to have significant worsening in her BUN and creatinine, and her outpatient nephrologist recommended going to the ED.  ED Course: Upon arrival to the ED, patient is found to be afebrile, saturating well on room air, just slightly tachycardic, and with stable blood pressure.  Chemistry panel is concerning for BUN of 120 and creatinine of 7.49, up from 65 and 5.10, respectively on 03/18/2019.  CBC is notable for leukocytosis to 13,100 and a mild normocytic anemia.  Nephrology was consulted by the ED physician and recommended medical admission [facility].  Review of Systems:  All other systems reviewed and apart from HPI, are negative.  Past Medical History:  Diagnosis Date  . Bipolar disorder ([place])   . Chronic back pain   . Diabetes mellitus without complication ([place])   . Difficulty walking   . Gout   . Hypertension   . Myocardial infarct ([place])   . Schizoaffective disorder ([place])   . Ulcer    stomach    Past Surgical History:  Procedure  Laterality Date  . ABDOMINAL HYSTERECTOMY       reports that she quit smoking about 12 years ago. She quit after 65.00 years of use. She has never used smokeless tobacco. She reports current alcohol use. She reports that she does not use drugs.  Allergies  Allergen Reactions  . Influenza Vaccines Anaphylaxis  . Menactra [Meningococcal A C Y&W-135 Conj]     Family History  Problem Relation Age of Onset  . Chronic Renal Failure Mother 19       required hemodialysis x 6 months  . Hypertension Mother   . Prostate cancer Brother      Prior to Admission medications   Medication Sig Start Date End Date Taking? Authorizing Provider  acetaminophen (TYLENOL) 325 MG tablet Take 2 tablets (650 mg total) by mouth every 6 (six) hours as needed for fever or headache (pain). Patient not taking: Reported on 03/18/2019 03/07/19   Barton Dubois, MD  amLODipine (NORVASC) 10 MG tablet Take 10 mg by mouth daily.    [provider]  aspirin EC 81 MG EC tablet Take 1 tablet (81 mg total) by mouth daily. Patient not taking: Reported on 03/18/2019 06/12/18   Murlean Iba, MD  colchicine 0.6 MG tablet Take 0.5 tablets (0.3 mg total) by mouth daily. 03/07/19   Barton Dubois, MD  docusate sodium (COLACE) 100 MG capsule Take 1 capsule (100 mg total) by mouth 2 (two) times daily. Patient not taking: Reported on 03/18/2019 03/07/19   Barton Dubois, MD  patiromer [facility]) 8.4 g packet Take 1 packet (8.4 g total) by mouth  daily. Patient not taking: Reported on 03/18/2019 03/07/19   Barton Dubois, MD  polyethylene glycol [facility] Floria Raveling) packet Take 17 g by mouth daily. Patient not taking: Reported on 03/18/2019 03/07/19   Barton Dubois, MD  QUEtiapine (SEROQUEL) 25 MG tablet Take 0.5 tablets (12.5 mg total) by mouth 2 (two) times daily. Patient not taking: Reported on 03/18/2019 03/07/19   Barton Dubois, MD  sodium bicarbonate 650 MG tablet Take 1 tablet (650 mg total) by mouth 3 (three) times  daily. Patient not taking: Reported on 03/18/2019 03/07/19   Barton Dubois, MD    Physical Exam: Vitals:   04/01/19 1806 04/01/19 1807  BP: (!) 114/100   Pulse: (!) 103   Resp: 14   Temp: 98 F (36.7 C)   TempSrc: Oral   SpO2: 93%   Weight:  77.1 kg  Height:  5\' 1"  (1.549 m)    Constitutional: NAD, calm  Eyes: PERTLA, lids and conjunctivae normal ENMT: Mucous membranes are dry. Posterior pharynx clear of any exudate or lesions.   Neck: normal, supple, no masses, no thyromegaly Respiratory: clear to auscultation bilaterally, no wheezing, no crackles. Normal respiratory effort.    Cardiovascular: S1 & S2 heard, regular rate and rhythm. No extremity edema.   Abdomen: No distension, no tenderness, soft. Bowel sounds active.  Musculoskeletal: no clubbing / cyanosis. No joint deformity upper and lower extremities.    Skin: Poor turgor. Warm, dry, well-perfused. Neurologic: CN 2-12 grossly intact. Sensation intact. Moving all extremities.  Psychiatric: Oriented to person only. Pleasant, cooperative.    Labs on Admission: I have personally reviewed following labs and imaging studies  CBC: Recent Labs  Lab 04/01/19 1839  WBC 13.1*  NEUTROABS 9.7*  HGB 10.7*  HCT 36.4  MCV 99.5  PLT 638   Basic Metabolic Panel: Recent Labs  Lab 04/01/19 1839  NA 141  K 4.8  CL 105  CO2 24  GLUCOSE 109*  BUN 120*  CREATININE 7.49*  CALCIUM 8.6*   GFR: Estimated Creatinine Clearance: 5.2 mL/min (A) (by C-G formula based on SCr of 7.49 mg/dL (H)). Liver Function Tests: No results for input(s): AST, ALT, ALKPHOS, BILITOT, PROT, ALBUMIN in the last 168 hours. No results for input(s): LIPASE, AMYLASE in the last 168 hours. No results for input(s): AMMONIA in the last 168 hours. Coagulation Profile: No results for input(s): INR, PROTIME in the last 168 hours. Cardiac Enzymes: No results for input(s): CKTOTAL, CKMB, CKMBINDEX, TROPONINI in the last 168 hours. BNP (last 3 results) No  results for input(s): PROBNP in the last 8760 hours. HbA1C: No results for input(s): HGBA1C in the last 72 hours. CBG: No results for input(s): GLUCAP in the last 168 hours. Lipid Profile: No results for input(s): CHOL, HDL, LDLCALC, TRIG, CHOLHDL, LDLDIRECT in the last 72 hours. Thyroid Function Tests: No results for input(s): TSH, T4TOTAL, FREET4, T3FREE, THYROIDAB in the last 72 hours. Anemia Panel: No results for input(s): VITAMINB12, FOLATE, FERRITIN, TIBC, IRON, RETICCTPCT in the last 72 hours. Urine analysis:    Component Value Date/Time   COLORURINE YELLOW 03/18/2019 1728   APPEARANCEUR CLEAR 03/18/2019 1728   LABSPEC 1.009 03/18/2019 1728   PHURINE 6.0 03/18/2019 1728   GLUCOSEU NEGATIVE 03/18/2019 1728   HGBUR SMALL (A) 03/18/2019 1728   BILIRUBINUR NEGATIVE 03/18/2019 1728   KETONESUR NEGATIVE 03/18/2019 1728   PROTEINUR 100 (A) 03/18/2019 1728   NITRITE NEGATIVE 03/18/2019 1728   LEUKOCYTESUR NEGATIVE 03/18/2019 1728   Sepsis Labs: @LABRCNTIP (procalcitonin:4,lacticidven:4) )No results found for  this or any previous visit (from the past 240 hour(s)).   Radiological Exams on Admission: Dg Chest Port 1 View  Result Date: 04/01/2019 CLINICAL DATA:  Weakness EXAM: PORTABLE CHEST 1 VIEW COMPARISON:  03/18/2019 FINDINGS: There is bilateral perihilar linear airspace disease likely reflecting atelectasis. There is no focal parenchymal opacity. There is no pleural effusion or pneumothorax. The heart and mediastinal contours are unremarkable. The osseous structures are unremarkable. IMPRESSION: No active disease. Electronically Signed   By: Kathreen Devoid   On: 04/01/2019 19:30    EKG: Not performed.   Assessment/Plan   1. Acute kidney injury superimposed on CKD V; uremia  - Presents for evaluation of worsening confusion and abnormal labs, found to have BUN 120 and SCr 7.49, up from 65 & 5.10 on 03/18/19  - Potassium and bicarb are normal, BP okay, and no volume issues, in  fact she appears hypovolemic with dry mucous membranes and poor skin turgor  - Nephrology consulted by ED physician and much appreciated, did not feel that she needs urgent HD now, but recommended medical admission  - Check urine chemistries, hydrate with IVF overnight, renally-dose medications, avoid nephrotoxins, follow wt and I/O's, repeat chem panel in am   2. Acute encephalopathy  - No focal neuro deficits identified  - Likely toxic/metabolic in setting of AKI on CKD 5 with BUN 120  - Check UA as below, hydrate as above, hold Seroquel, continue supportive care   3. Hypertension   - Continue Norvasc    4. Leukocytosis   - WBC 13,100 in ED without fever  - There is no respiratory s/s and CXR is clear  - Abd exam benign, no meningismus, and no skin infection noted  - She does not have urinary complaints but is disoriented  - Check UA, culture if febrile, repeat CBC in am     PPE: Mask, face shield  DVT prophylaxis: SCD's  Code Status: Full; patient indicated that she wanted to be full code last admit per documentation and is currently confused.  Family Communication: Discussed with patient  Consults called: Nephrology   Admission status: Inpatient. I certified that patient will require inpatient management d/t her acute renal failure on CKD 5 with uremia and confusion as she does not have HD access yet and will require intensive inpatient monitoring and treatments with specialist consultation.    Vianne Bulls, MD [facility] Pager [phone]  If 7PM-7AM, please contact night-coverage [email or web address] Password [facility]  04/01/2019, 8:38 PM

## 2019-04-01 NOTE — ED Triage Notes (Signed)
Family wanted patient brought here for abd labs per [facility] they are sending her most recent labs

## 2019-04-01 NOTE — ED Notes (Signed)
Dr opyd in room

## 2019-04-01 NOTE — ED Provider Notes (Signed)
[facility] EMERGENCY DEPARTMENT Provider Note   CSN: [id number] Arrival date & time: 04/01/19  1742    History   Chief Complaint Chief Complaint  Patient presents with  . Abnormal Lab    HPI Kristen Kaufman is a 85 y.o. female who presents with confusion and possible emergent dialysis. PMH significant for CKD stage 4-5, schizoaffective d/o, DM, HTN. She is a poor historian and does not contribute much to her history. She states she was sent here because her "blood is high". She cannot further elaborate. I spoke with her son, Kristen Kaufman, who states that she was admitted last month for an AKI, UTI, and pneumonia. She was told that she would eventually need dialysis and needed to establish care with a nephrologist in [place], [place] where she lives. She has been seeing Dr. Lindalou Hose who is a nephrologist in [place]. She was seen again in the ED on 3/20 for eval for possible need for emergent dialysis. Her BUN/Cr was essentially unchanged since her discharge from the hospital and after discussion with the nephrologist on call here (Dr. Justin Mend) it was determined that emergency dialysis was not required. She was advised to f/u with her kidney doctor again and discuss planning for dialysis in the future. She had labs drawn again on 4/7 which resulted today. Dr. Lindalou Hose called Kristen Kaufman and told him that he was going to recommend her to come here to the ED to be admitted for fluids vs emergent dialysis.   LEVEL 5 caveat due to pt confusion   HPI  Past Medical History:  Diagnosis Date  . Bipolar disorder ([place])   . Chronic back pain   . Diabetes mellitus without complication ([place])   . Difficulty walking   . Gout   . Hypertension   . Myocardial infarct ([place])   . Schizoaffective disorder ([place])   . Ulcer    stomach    Patient Active Problem List   Diagnosis Date Noted  . CAP (community acquired pneumonia) 03/01/2019  . Acute lower UTI 03/01/2019  . Rheumatoid arthritis ([place]) 06/12/2018  . Chest pain  06/12/2018  . Gout 02/13/2018  . CAD (coronary artery disease) 02/13/2018  . Constipation 06/13/2017  . AKI (acute kidney injury) ([place]) 05/30/2017  . UTI (urinary tract infection), uncomplicated 44/31/5400  . UTI (urinary tract infection) 05/25/2017  . Hyperkalemia 05/25/2017  . Acute renal failure superimposed on stage 4 chronic kidney disease ([place])   . Anemia in stage 4 chronic kidney disease ([place])   . Renal insufficiency 05/24/2017  . Diabetes mellitus type 2 in obese ([place]) 05/24/2017  . Essential hypertension 05/24/2017    Past Surgical History:  Procedure Laterality Date  . ABDOMINAL HYSTERECTOMY       OB History    Gravida  6   Para  6   Term  6   Preterm      AB      Living  3     SAB      TAB      Ectopic      Multiple      Live Births               Home Medications    Prior to Admission medications   Medication Sig Start Date End Date Taking? Authorizing Provider  acetaminophen (TYLENOL) 325 MG tablet Take 2 tablets (650 mg total) by mouth every 6 (six) hours as needed for fever or headache (pain). Patient not taking: Reported on 03/18/2019 03/07/19   Dyann Kief,  Clifton James, MD  amLODipine (NORVASC) 10 MG tablet Take 10 mg by mouth daily.    [provider]  aspirin EC 81 MG EC tablet Take 1 tablet (81 mg total) by mouth daily. Patient not taking: Reported on 03/18/2019 06/12/18   Murlean Iba, MD  colchicine 0.6 MG tablet Take 0.5 tablets (0.3 mg total) by mouth daily. 03/07/19   Barton Dubois, MD  docusate sodium (COLACE) 100 MG capsule Take 1 capsule (100 mg total) by mouth 2 (two) times daily. Patient not taking: Reported on 03/18/2019 03/07/19   Barton Dubois, MD  patiromer [facility]) 8.4 g packet Take 1 packet (8.4 g total) by mouth daily. Patient not taking: Reported on 03/18/2019 03/07/19   Barton Dubois, MD  polyethylene glycol [facility] / Floria Raveling) packet Take 17 g by mouth daily. Patient not taking: Reported on 03/18/2019 03/07/19    Barton Dubois, MD  QUEtiapine (SEROQUEL) 25 MG tablet Take 0.5 tablets (12.5 mg total) by mouth 2 (two) times daily. Patient not taking: Reported on 03/18/2019 03/07/19   Barton Dubois, MD  sodium bicarbonate 650 MG tablet Take 1 tablet (650 mg total) by mouth 3 (three) times daily. Patient not taking: Reported on 03/18/2019 03/07/19   Barton Dubois, MD    Family History Family History  Problem Relation Age of Onset  . Chronic Renal Failure Mother 60       required hemodialysis x 6 months  . Hypertension Mother   . Prostate cancer Brother     Social History Social History   Tobacco Use  . Smoking status: Former Smoker    Years: 65.00    Last attempt to quit: 10/05/2006    Years since quitting: 12.4  . Smokeless tobacco: Never Used  Substance Use Topics  . Alcohol use: Yes    Comment: Quit in 2007 - h/o heavy use  . Drug use: No     Allergies   Influenza vaccines and Menactra [meningococcal a c y&w-135 conj]   Review of Systems Review of Systems  Unable to perform ROS: Other (confusion)     Physical Exam Updated Vital Signs BP (!) 114/100 (BP Location: Right Arm)   Pulse (!) 103   Temp 98 F (36.7 C) (Oral)   Resp 14   Ht 5\' 1"  (1.549 m)   Wt 77.1 kg   SpO2 93%   BMI 32.12 kg/m   Physical Exam Vitals signs and nursing note reviewed.  Constitutional:      General: She is not in acute distress.    Appearance: Normal appearance. She is well-developed. She is not ill-appearing.     Comments: Chronically ill appearing. Difficult to understand   HENT:     Head: Normocephalic and atraumatic.  Eyes:     General: No scleral icterus.       Right eye: No discharge.        Left eye: No discharge.     Conjunctiva/sclera: Conjunctivae normal.     Pupils: Pupils are equal, round, and reactive to light.  Neck:     Musculoskeletal: Normal range of motion.  Cardiovascular:     Rate and Rhythm: Normal rate and regular rhythm.  Pulmonary:     Effort: Pulmonary  effort is normal. No respiratory distress.     Breath sounds: Normal breath sounds.  Abdominal:     General: There is no distension.     Palpations: Abdomen is soft.     Tenderness: There is no abdominal tenderness.  Skin:  General: Skin is warm and dry.  Neurological:     Mental Status: She is alert and oriented to person, place, and time. She is confused.  Psychiatric:        Behavior: Behavior normal.      ED Treatments / Results  Labs (all labs ordered are listed, but only abnormal results are displayed) Labs Reviewed  BASIC METABOLIC PANEL - Abnormal; Notable for the following components:      Result Value   Glucose, Bld 109 (*)    BUN 120 (*)    Creatinine, Ser 7.49 (*)    Calcium 8.6 (*)    GFR calc non Af Amer 4 (*)    GFR calc Af Amer 5 (*)    All other components within normal limits  CBC WITH DIFFERENTIAL/PLATELET - Abnormal; Notable for the following components:   WBC 13.1 (*)    RBC 3.66 (*)    Hemoglobin 10.7 (*)    MCHC 29.4 (*)    Neutro Abs 9.7 (*)    Monocytes Absolute 1.3 (*)    Abs Immature Granulocytes 0.09 (*)    All other components within normal limits  MRSA PCR SCREENING  BASIC METABOLIC PANEL  CBC WITH DIFFERENTIAL/PLATELET  URINALYSIS, COMPLETE (UACMP) WITH MICROSCOPIC  SODIUM, URINE, RANDOM  CREATININE, URINE, RANDOM    EKG None  Radiology Dg Chest Port 1 View  Result Date: 04/01/2019 CLINICAL DATA:  Weakness EXAM: PORTABLE CHEST 1 VIEW COMPARISON:  03/18/2019 FINDINGS: There is bilateral perihilar linear airspace disease likely reflecting atelectasis. There is no focal parenchymal opacity. There is no pleural effusion or pneumothorax. The heart and mediastinal contours are unremarkable. The osseous structures are unremarkable. IMPRESSION: No active disease. Electronically Signed   By: Kathreen Devoid   On: 04/01/2019 19:30    Procedures Procedures (including critical care time)  Medications Ordered in ED Medications  amLODipine  (NORVASC) tablet 10 mg (has no administration in time range)  sodium chloride flush (NS) 0.9 % injection 3 mL (has no administration in time range)  0.9 %  sodium chloride infusion (has no administration in time range)  acetaminophen (TYLENOL) tablet 650 mg (has no administration in time range)    Or  acetaminophen (TYLENOL) suppository 650 mg (has no administration in time range)  ondansetron (ZOFRAN) tablet 4 mg (has no administration in time range)    Or  ondansetron (ZOFRAN) injection 4 mg (has no administration in time range)     Initial Impression / Assessment and Plan / ED Course  I have reviewed the triage vital signs and the nursing notes.  Pertinent labs & imaging results that were available during my care of the patient were reviewed by me and considered in my medical decision making (see chart for details).  85 year old female presents with worsening confusion and renal function. She has been seen here multiple times for the same. She is hypertensive but otherwise vitals are normal. She is confused but is not in any distress and does not look particularly volume overloaded. I had a long discussion with her son, Kristen Kaufman. He wants the patient to have dialysis and states that the patient's nephrologist recommended her to come to the hospital - specifically [facility] because he does not want her to go to [facility]. Will obtain labs, CXR  CBC is remarkable for anemia which is unchanged. BMP shows worsening kidney function. BUN is 120, SCr is 7.49, GFR is 5.   7:46 PM Had a discussion with Dr  Baveja. She confirms the patient's son Kristen Kaufman story and that she does think the patient needs to be set up for dialysis in the hospital.  I explained to her that we do not typically do emergent dialysis at [facility] which she was unaware of.  She states that she is tried to convince the patient's son to have her go to [place] but he refuses.  I consulted with our nephrologist Dr. Azzie Roup.   He agrees the patient does not need emergent dialysis tonight but is agreeable to having the patient come in for placement of a temporary dialysis catheter.  Case was discussed with attending Dr. Alvino Chapel.  Patient was admitted by Dr. Myna Hidalgo.  Final Clinical Impressions(s) / ED Diagnoses   Final diagnoses:  ESRD (end stage renal disease) [facility])  Uremia    ED Discharge Orders    None       Iris Pert 04/01/19 2227    Davonna Belling, MD 04/01/19 2227

## 2019-04-02 DIAGNOSIS — Z7189 Other specified counseling: Secondary | ICD-10-CM

## 2019-04-02 DIAGNOSIS — L8990 Pressure ulcer of unspecified site, unspecified stage: Secondary | ICD-10-CM

## 2019-04-02 DIAGNOSIS — I2510 Atherosclerotic heart disease of native coronary artery without angina pectoris: Secondary | ICD-10-CM

## 2019-04-02 DIAGNOSIS — Z515 Encounter for palliative care: Secondary | ICD-10-CM

## 2019-04-02 LAB — CBC WITH DIFFERENTIAL/PLATELET
Abs Immature Granulocytes: 0.07 10*3/uL (ref 0.00–0.07)
Basophils Absolute: 0.1 10*3/uL (ref 0.0–0.1)
Basophils Relative: 0 %
Eosinophils Absolute: 0.2 10*3/uL (ref 0.0–0.5)
Eosinophils Relative: 2 %
HCT: 35.3 % — ABNORMAL LOW (ref 36.0–46.0)
Hemoglobin: 10.4 g/dL — ABNORMAL LOW (ref 12.0–15.0)
Immature Granulocytes: 1 %
Lymphocytes Relative: 16 %
Lymphs Abs: 1.9 10*3/uL (ref 0.7–4.0)
MCH: 29.2 pg (ref 26.0–34.0)
MCHC: 29.5 g/dL — ABNORMAL LOW (ref 30.0–36.0)
MCV: 99.2 fL (ref 80.0–100.0)
Monocytes Absolute: 1.4 10*3/uL — ABNORMAL HIGH (ref 0.1–1.0)
Monocytes Relative: 12 %
Neutro Abs: 8.1 10*3/uL — ABNORMAL HIGH (ref 1.7–7.7)
Neutrophils Relative %: 69 %
Platelets: 256 10*3/uL (ref 150–400)
RBC: 3.56 MIL/uL — ABNORMAL LOW (ref 3.87–5.11)
RDW: 15.1 % (ref 11.5–15.5)
WBC: 11.7 10*3/uL — ABNORMAL HIGH (ref 4.0–10.5)
nRBC: 0 % (ref 0.0–0.2)

## 2019-04-02 LAB — BASIC METABOLIC PANEL
Anion gap: 12 (ref 5–15)
BUN: 106 mg/dL — ABNORMAL HIGH (ref 8–23)
CO2: 24 mmol/L (ref 22–32)
Calcium: 8.5 mg/dL — ABNORMAL LOW (ref 8.9–10.3)
Chloride: 108 mmol/L (ref 98–111)
Creatinine, Ser: 7.19 mg/dL — ABNORMAL HIGH (ref 0.44–1.00)
GFR calc Af Amer: 5 mL/min — ABNORMAL LOW (ref >60)
GFR calc non Af Amer: 5 mL/min — ABNORMAL LOW (ref >60)
Glucose, Bld: 97 mg/dL (ref 70–99)
Potassium: 4.9 mmol/L (ref 3.5–5.1)
Sodium: 144 mmol/L (ref 135–145)

## 2019-04-02 LAB — PROTIME-INR
INR: 1.3 — ABNORMAL HIGH (ref 0.8–1.2)
Prothrombin Time: 15.8 seconds — ABNORMAL HIGH (ref 11.4–15.2)

## 2019-04-02 NOTE — Consult Note (Signed)
Referring Provider: No ref. provider found Primary Care Physician:  Sherrilee Gilles, DO Primary Nephrologist:  Dr. Adrian Prince  Reason for Consultation: Medical management of chronic kidney disease stage V, anemia evaluation of secondary hyperparathyroidism maintenance of euvolemia  HPI: This is a 85 year old lady with chronic renal insufficiency stage V not on dialysis yet, she has a history of diabetes hypertension and bipolar disorder.  She has presented to the emergency room with altered mental status.  With increasing confusion.  She has been seen for about 18 months by nephrologist in [place].  Family was told that she was in kidney failure.  She did not want dialysis at that time.   She was admitted 03/01/2019 at [facility] for renal failure until 03/07/2019 she also had E. coli UTI and pneumonia .  She was released to the nursing home [facility] after hospital stay.  She was told she needed dialysis at that time.  She has a stage V chronic kidney disease since February 2019 and a creatinine has been worsening.  It appears that her creatinine in February 2019 was 3.78 March 2020 4.34 at the time of discharge 03/18/2019 was 5.10 mg/dl    The emergency room, Kristen Kaufman was confused.  Her BUN was 128 creatinine was 7.49.  She was admitted for evaluation and treatment of her acute on chronic kidney failure.  Chest x-ray 04/01/2019 revealed no active disease.  Home medications BuSpar 5 mg twice daily, calcium acetate 3 times daily with meals, Anusol as needed, multivitamins 1 daily tramadol 50 mg 3 times daily.  Colchicine 0.3 mg daily, amlodipine 10 mg daily, Veltassa 8.4 g daily, quetiapine 0.5 mg twice daily, sodium bicarbonate 650 mg 3 times daily  Blood pressure 153/78 pulse 93 temperature 97.9 O2 sats 98% room air  Current medications Norvasc 10 mg daily.  Sodium 144 potassium 4.9 chloride 108 CO2 24 BUN 106 creatinine 7.19 calcium 8.5 ALT 23  AST 23 albumin 3.5 WBC 11.7 hemoglobin 10.4 platelets 256.  CT scan of the head revealed moderate chronic ischemic microvascular changes that were age-related with age-related atrophy and a small old lacunar infarct 03/18/2019.  Abdominal CT showed increasing stool within the rectum presacral soft tissue stranding reflecting proctitis, unchanged cholelithiasis and aortic atherosclerosis 03/18/2019.    Past Medical History:  Diagnosis Date  . Bipolar disorder ([place])   . Chronic back pain   . Diabetes mellitus without complication ([place])   . Difficulty walking   . Gout   . Hypertension   . Myocardial infarct ([place])   . Schizoaffective disorder ([place])   . Ulcer    stomach    Past Surgical History:  Procedure Laterality Date  . ABDOMINAL HYSTERECTOMY      Prior to Admission medications   Medication Sig Start Date End Date Taking? Authorizing Provider  acetaminophen (TYLENOL) 325 MG tablet Take 2 tablets (650 mg total) by mouth every 6 (six) hours as needed for fever or headache (pain). 03/07/19  Yes Barton Dubois, MD  amLODipine (NORVASC) 10 MG tablet Take 10 mg by mouth daily.   Yes [provider]  busPIRone (BUSPAR) 5 MG tablet Take 5 mg by mouth 2 (two) times daily.   Yes [provider]  calcium acetate, Phos Binder, (PHOSLYRA) 667 MG/5ML SOLN Take 667 mg by mouth 3 (three) times daily with meals.   Yes [provider]  colchicine 0.6 MG tablet Take 0.5 tablets (0.3 mg total) by mouth daily.  03/07/19  Yes Barton Dubois, MD  docusate sodium (COLACE) 100 MG capsule Take 1 capsule (100 mg total) by mouth 2 (two) times daily. 03/07/19  Yes Barton Dubois, MD  hydrocortisone (ANUSOL-HC) 25 MG suppository Place 25 mg rectally 2 (two) times daily as needed for hemorrhoids or anal itching.   Yes [provider]  multivitamin (RENA-VIT) TABS tablet Take 1 tablet by mouth daily.   Yes [provider]  patiromer (VELTASSA) 8.4 g packet Take 1 packet (8.4  g total) by mouth daily. 03/07/19  Yes Barton Dubois, MD  polyethylene glycol [facility] / GLYCOLAX) packet Take 17 g by mouth daily. Patient taking differently: Take 17 g by mouth daily as needed for mild constipation.  03/07/19  Yes Barton Dubois, MD  QUEtiapine (SEROQUEL) 25 MG tablet Take 0.5 tablets (12.5 mg total) by mouth 2 (two) times daily. 03/07/19  Yes Barton Dubois, MD  sodium bicarbonate 650 MG tablet Take 1 tablet (650 mg total) by mouth 3 (three) times daily. 03/07/19  Yes Barton Dubois, MD  traMADol (ULTRAM) 50 MG tablet Take 50 mg by mouth 3 (three) times daily.   Yes [provider]    Current Facility-Administered Medications  Medication Dose Route Frequency Provider Last Rate Last Dose  . acetaminophen (TYLENOL) tablet 650 mg  650 mg Oral Q6H PRN Opyd, Ilene Qua, MD       Or  . acetaminophen (TYLENOL) suppository 650 mg  650 mg Rectal Q6H PRN Opyd, Ilene Qua, MD      . amLODipine (NORVASC) tablet 10 mg  10 mg Oral Daily Opyd, Ilene Qua, MD   10 mg at 04/02/19 0848  . Chlorhexidine Gluconate Cloth 2 % PADS 6 each  6 each Topical Q0600 Opyd, Ilene Qua, MD   6 each at 04/02/19 0513  . mupirocin ointment (BACTROBAN) 2 % 1 application  1 application Nasal BID Opyd, Ilene Qua, MD   1 application at 75/10/25 0848  . ondansetron (ZOFRAN) tablet 4 mg  4 mg Oral Q6H PRN Opyd, Ilene Qua, MD       Or  . ondansetron (ZOFRAN) injection 4 mg  4 mg Intravenous Q6H PRN Opyd, Ilene Qua, MD      . sodium chloride flush (NS) 0.9 % injection 3 mL  3 mL Intravenous Q12H Opyd, Ilene Qua, MD   3 mL at 04/02/19 0849    Allergies as of 04/01/2019 - Review Complete 04/01/2019  Allergen Reaction Noted  . Influenza vaccines Anaphylaxis 05/24/2017  . Menactra [meningococcal a c y&w-135 conj]  10/05/2016    Family History  Problem Relation Age of Onset  . Chronic Renal Failure Mother 36       required hemodialysis x 6 months  . Hypertension Mother   . Prostate cancer Brother      Social History   Socioeconomic History  . Marital status: Single    Spouse name: Not on file  . Number of children: Not on file  . Years of education: Not on file  . Highest education level: Not on file  Occupational History  . Occupation: Retired  Scientific laboratory technician  . Financial resource strain: Not on file  . Food insecurity:    Worry: Not on file    Inability: Not on file  . Transportation needs:    Medical: Not on file    Non-medical: Not on file  Tobacco Use  . Smoking status: Former Smoker    Years: 65.00    Last attempt to quit:  10/05/2006    Years since quitting: 12.4  . Smokeless tobacco: Never Used  Substance and Sexual Activity  . Alcohol use: Yes    Comment: Quit in 2007 - h/o heavy use  . Drug use: No  . Sexual activity: Not on file  Lifestyle  . Physical activity:    Days per week: Not on file    Minutes per session: Not on file  . Stress: Not on file  Relationships  . Social connections:    Talks on phone: Not on file    Gets together: Not on file    Attends religious service: Not on file    Active member of club or organization: Not on file    Attends meetings of clubs or organizations: Not on file    Relationship status: Not on file  . Intimate partner violence:    Fear of current or ex partner: Not on file    Emotionally abused: Not on file    Physically abused: Not on file    Forced sexual activity: Not on file  Other Topics Concern  . Not on file  Social History Narrative  . Not on file    Review of Systems: Patient confused disoriented to time place and person  Physical Exam: Vital signs in last 24 hours: Temp:  [97.9 F (36.6 C)-98.3 F (36.8 C)] 97.9 F (36.6 C) (04/14 0525) Pulse Rate:  [93-103] 93 (04/14 0525) Resp:  [14-18] 16 (04/14 0525) BP: (114-153)/(70-100) 153/78 (04/14 0525) SpO2:  [93 %-98 %] 98 % (04/14 0525) Weight:  [75.2 kg-77.1 kg] 75.2 kg (04/14 0500)   General: Elderly weak lady nondistressed.  She does not  follow commands Head:  Normocephalic and atraumatic. Eyes:  Sclera clear, no icterus.   Conjunctiva pink. Ears:  Normal auditory acuity. Nose:  No deformity, discharge,  or lesions. Mouth:  No deformity or lesions, dentition normal. Neck:  Supple; no masses or thyromegaly. JVP not elevated Lungs:  Clear throughout to auscultation.   No wheezes, crackles, or rhonchi. No acute distress. Heart:  Regular rate and rhythm; no murmurs, clicks, rubs,  or gallops. Abdomen:  Soft, nontender and nondistended. No masses, hepatosplenomegaly or hernias noted. Normal bowel sounds, without guarding, and without rebound.   Msk:  Symmetrical without gross deformities. Normal posture. Pulses:  No carotid, renal, femoral bruits. DP and PT symmetrical and equal Extremities:  Without clubbing r no edema Neurologic: She is disoriented to time place person Skin:  Intact without significant lesions or rashes. Cervical Nodes:  No significant cervical adenopathy. Psych:  Alert and cooperative. Normal mood and affect.  Intake/Output from previous day: 04/13 0701 - 04/14 0700 In: 417.5 [I.V.:417.5] Out: -  Intake/Output this shift: No intake/output data recorded.  Lab Results: Recent Labs    04/01/19 1839 04/02/19 0551  WBC 13.1* 11.7*  HGB 10.7* 10.4*  HCT 36.4 35.3*  PLT 284 256   BMET Recent Labs    04/01/19 1839 04/02/19 0551  NA 141 144  K 4.8 4.9  CL 105 108  CO2 24 24  GLUCOSE 109* 97  BUN 120* 106*  CREATININE 7.49* 7.19*  CALCIUM 8.6* 8.5*   LFT No results for input(s): PROT, ALBUMIN, AST, ALT, ALKPHOS, BILITOT, BILIDIR, IBILI in the last 72 hours. PT/INR No results for input(s): LABPROT, INR in the last 72 hours. Hepatitis Panel No results for input(s): HEPBSAG, HCVAB, HEPAIGM, HEPBIGM in the last 72 hours.  Studies/Results: Dg Chest Port 1 View  Result Date: 04/01/2019 CLINICAL  DATA:  Weakness EXAM: PORTABLE CHEST 1 VIEW COMPARISON:  03/18/2019 FINDINGS: There is bilateral  perihilar linear airspace disease likely reflecting atelectasis. There is no focal parenchymal opacity. There is no pleural effusion or pneumothorax. The heart and mediastinal contours are unremarkable. The osseous structures are unremarkable. IMPRESSION: No active disease. Electronically Signed   By: Kathreen Devoid   On: 04/01/2019 19:30    Assessment/Plan:  Chronic renal insufficiency stage V.  She has obvious progression of her renal insufficiency.  She is currently a resident of nursing home in [place].  I doubt her situation is going to improve much with dialysis.  She appears to be encephalopathic.  She is confused.  She is not following commands.  The son is making decisions and he lives in [place].  There is a boyfriend who lives in the house have spoken to him.  He is not power of attorney however and have not provided any medical information to him.  Family of [place] ambivalent about proceeding with dialysis treatments.  Spoke to a sister and to [name] the power of attorney.  This was on a party line.  Have outlined the process of dialysis the need to 10 treatments 3 times a week for 4 hours.  I believe this would be hard for Kristen Kaufman to do.  We would also need to arrange for access such as a dialysis catheter and initiate treatments in the hospital.  The son is very aware of this.  I am now waiting for final decision.  I have talked to Dr. Constance Haw our surgeon in [place] and she is willing to put a dialysis catheter and if needed.  Anemia stable not an issue at this time  Hypertension/volume blood pressure under better control with amlodipine 10 mg daily we will continue to follow.  Does not appear to have any volume excess  Metabolic acidosis continues on sodium bicarbonate appears to not be an issue  Hyperkalemia patient was taking daily Veltassa.  It appears that she does have some proctitis on on her CT scan 03/18/2019 this may be related to the use of these agents.  Disposition  and waiting for final decision from family.   LOS: [place] @TODAY @9 :05 AM

## 2019-04-02 NOTE — TOC Initial Note (Signed)
Transition of Care [facility]) - Initial/Assessment Note    Patient Details  Name: Kristen Kaufman MRN: [id number] Date of Birth: 03-21-1931  [facility] CM/SW Contact:    Trish Mage, LCSW Phone Number: 04/02/2019, 11:34 AM  Clinical Narrative:     85 YO AA female diagnosed with acutre renal failure superimposed on stage 5 chronic kidney disease, not on dialysis.  Pt currently resides at [facility] in [place] since last hospitalization in March of this year.  She has opted out of starting dialysis in recent past when offered as an option. She presents with confusion.  Son and daughter are open to hospice care, and a consult has been ordered.              Expected Discharge Plan: [place] Barriers to Discharge: Hospice Bed not available(in the context of COVID-19)   Patient Goals and CMS Choice Patient states their goals for this hospitalization and ongoing recovery are:: Unable to verbalize      Expected Discharge Plan and Services Expected Discharge Plan: [place] In-house Referral: Clinical Social Work, Hospice / [place] Acute Care Choice: Hospice Living arrangements for the past 2 months: [place], [place]                          Prior Living Arrangements/Services Living arrangements for the past 2 months: [place], [place] Lives with:: Other (Comment)([facility]) Patient language and need for interpreter reviewed:: Yes        Need for Family Participation in Patient Care: Yes (Comment) Care giver support system in place?: Yes (comment)   Criminal Activity/Legal Involvement Pertinent to Current Situation/Hospitalization: No - Comment as needed  Activities of Daily Living Home Assistive Devices/Equipment: Wheelchair ADL Screening (condition at time of admission) Patient's cognitive ability adequate to safely complete daily activities?: No Is the  patient deaf or have difficulty hearing?: Yes Does the patient have difficulty seeing, even when wearing glasses/contacts?: No Does the patient have difficulty concentrating, remembering, or making decisions?: Yes Patient able to express need for assistance with ADLs?: No Does the patient have difficulty dressing or bathing?: Yes Independently performs ADLs?: No Communication: Independent Dressing (OT): Needs assistance Is this a change from baseline?: Pre-admission baseline Grooming: Needs assistance Is this a change from baseline?: Pre-admission baseline Feeding: Needs assistance Is this a change from baseline?: Pre-admission baseline Bathing: Needs assistance Is this a change from baseline?: Pre-admission baseline Toileting: Needs assistance Is this a change from baseline?: Pre-admission baseline In/Out Bed: Needs assistance Is this a change from baseline?: Pre-admission baseline Walks in Home: Needs assistance Is this a change from baseline?: Pre-admission baseline Does the patient have difficulty walking or climbing stairs?: No Weakness of Legs: Both Weakness of Arms/Hands: None  Permission Sought/Granted Permission sought to share information with : Family Supports Permission granted to share information with : Yes, Verbal Permission Granted  Share Information with NAME: Raini Tiley     Permission granted to share info w Relationship: son who is healthcare POA  Permission granted to share info w Contact Information: [phone]  Emotional Assessment Appearance:: Appears stated age Attitude/Demeanor/Rapport: Unable to Assess Affect (typically observed): Calm Orientation: : Oriented to Self, Oriented to  Time Alcohol / Substance Use: Not Applicable Psych Involvement: Outpatient Provider  Admission diagnosis:  Uremia [N19] ESRD (end stage renal disease) ([place]) [N18.6] Patient Active Problem List  Diagnosis Date Noted  . Pressure injury of skin 04/02/2019  . Acute  renal failure superimposed on stage 5 chronic kidney disease, not on chronic dialysis ([place]) 04/01/2019  . Acute metabolic encephalopathy 88/67/7373  . Leukocytosis 04/01/2019  . Uremia   . CAP (community acquired pneumonia) 03/01/2019  . Acute lower UTI 03/01/2019  . Rheumatoid arthritis ([place]) 06/12/2018  . Chest pain 06/12/2018  . Gout 02/13/2018  . CAD (coronary artery disease) 02/13/2018  . Constipation 06/13/2017  . AKI (acute kidney injury) ([place]) 05/30/2017  . UTI (urinary tract infection), uncomplicated 66/81/5947  . UTI (urinary tract infection) 05/25/2017  . Hyperkalemia 05/25/2017  . Acute renal failure superimposed on stage 4 chronic kidney disease ([place])   . Anemia in stage 4 chronic kidney disease ([place])   . Renal insufficiency 05/24/2017  . Diabetes mellitus type 2 in obese ([place]) 05/24/2017  . Essential hypertension 05/24/2017   PCP:  Sherrilee Gilles, DO Pharmacy:   [facility] [place], [address] AT [address] [place] [address] Phone: [phone] Fax: [phone]     Social Determinants of Health (SDOH) Interventions    Readmission Risk Interventions Readmission Risk Prevention Plan 03/06/2019  Transportation Screening Complete  PCP or Specialist Appt within 3-5 Days Complete  HRI or [place] Complete  Social Work Consult for [place] Planning/Counseling Complete  Palliative Care Screening Not Applicable  Medication Review Press photographer) Complete

## 2019-04-02 NOTE — Consult Note (Signed)
Consultation Note Date: 04/02/2019  Patient Name: Kristen Kaufman  DOB: 08-21-1931  MRN: [id number]  Age / Sex: 85 y.o., female  PCP: Kristen Gilles, DO Referring Physician: Murlean Iba, MD  Reason for Consultation: Establishing goals of care  HPI/Patient Profile: 85 y.o. female  with past medical history of CKD stage V, DM, HTN, MI, bipolar disorder, schizoaffective disorder admitted on 04/01/2019 with worsening renal functions and confusion. Patient presented with confusion concerning for uremia. BUN/Creat 120/7.49. Gentle hydration initiated. Nephrology consulted and has discussed with family poor candidacy for dialysis. Family decision to not initiate dialysis and shift towards comfort focused care. Palliative medicine consultation for GOC/EOL care.   Clinical Assessment and Goals of Care:  I have reviewed medical records, discussed with care team, and assessed the patient at bedside. She is awake but disoriented. She is picking at her gown and blankets. She is delayed with responses. She does not appear to be in distress or discomfort. No family at bedside during visit.   Shortly after, spoke with son Kristen Kaufman) via telephone to discuss diagnosis, prognosis, [place], EOL wishes, disposition and options.  I introduced Palliative Medicine as specialized medical care for people living with serious illness. It focuses on providing relief from the symptoms and stress of a serious illness.   We discussed a brief life review of the patient. Patient is not legally married but has been with Kristen Kaufman, Kristen Kaufman for 45 years! Kristen Kaufman shares that his mother has been followed by nephrologist in [place] for about 18 months and in the past, his mother has not felt comfortable with the thought of starting dialysis. Kristen Kaufman shares that she has been "in and out of the hospital" with worsening kidney functions. He has had  a good understanding from conversations with nephrologist that "it was only going to get worse." Kristen Kaufman shares that with each admission, she receives fluids ("a bandaid") but her kidney functions eventually worsen again.   Discussed course of hospitalization including diagnoses and interventions. Kristen Kaufman shares his conversations with Kristen Kaufman and Kristen Kaufman this afternoon, understanding she is a poor candidate for dialysis. "Really, really tough on her." He explains focus on "quality versus quantity" of life for his mother. Kristen Kaufman has discussed with his Kaufman Kristen Kaufman) and sister, and family has decided against initiation of dialysis. "Keep her comfortable."   The difference between aggressive medical intervention and comfort care was considered in light of the patient and family goals of care. The natural disease trajectory and expectations at EOL were discussed. Introduced hospice philosophy and options. Kristen Kaufman shares his understanding that her prognosis could be "six days, six months, or six hours." We did discuss worsening kidney functions, uremia and low urine output indicating shorter prognosis. Explained challenges for nursing staff to receive accurate UOP with confusion/pulling out purewick as well as episodes of incontinence.  Kristen Kaufman lives in [place] but is planning to come to hospital tomorrow afternoon around 1pm. Kristen Kaufman has removed visitor restriction for EOL care. Kristen Kaufman and PMT provider to  further discuss hospice options and plan of care tomorrow afternoon.   Questions and concerns were addressed. PMT contact information given.    SUMMARY OF RECOMMENDATIONS    DNR  Family decision against initiation of dialysis, understanding poor prognosis.   Introduced hospice options and philosophy. PMT NP to f/u with son, Kristen Kaufman, tomorrow afternoon. SW to further evaluate if hospice services could be possible at [facility] (Patient was previously there under rehab but has  Medicaid).   Code Status/Advance Care Planning:  DNR  Symptom Management:   Per attending  Palliative Prophylaxis:   Aspiration, Delirium Protocol, Frequent Pain Assessment, Oral Care and Turn Reposition  Psycho-social/Spiritual:   Desire for further Chaplaincy support:yes  Additional Recommendations: Caregiving  Support/Resources, Compassionate Wean Education and Education on Hospice  Prognosis:   Poor prognosis with AKI superimposed on CKD stage V, worsening kidney functions, poor urine output and uremia. Family decision against initiation of dialysis and focus on comfort.  Discharge Planning: To Be Determined      Primary Diagnoses: Present on Admission: . Acute renal failure superimposed on stage 5 chronic kidney disease, not on chronic dialysis ([place]) . Essential hypertension . CAD (coronary artery disease) . Acute metabolic encephalopathy . Leukocytosis   I have reviewed the medical record, interviewed the patient and family, and examined the patient. The following aspects are pertinent.  Past Medical History:  Diagnosis Date  . Bipolar disorder ([place])   . Chronic back pain   . Diabetes mellitus without complication ([place])   . Difficulty walking   . Gout   . Hypertension   . Myocardial infarct ([place])   . Schizoaffective disorder ([place])   . Ulcer    stomach   Social History   Socioeconomic History  . Marital status: Single    Spouse name: Not on file  . Number of children: Not on file  . Years of education: Not on file  . Highest education level: Not on file  Occupational History  . Occupation: Retired  Scientific laboratory technician  . Financial resource strain: Not on file  . Food insecurity:    Worry: Not on file    Inability: Not on file  . Transportation needs:    Medical: Not on file    Non-medical: Not on file  Tobacco Use  . Smoking status: Former Smoker    Years: 65.00    Last attempt to quit: 10/05/2006    Years since quitting: 12.4  . Smokeless  tobacco: Never Used  Substance and Sexual Activity  . Alcohol use: Yes    Comment: Quit in 2007 - h/o heavy use  . Drug use: No  . Sexual activity: Not on file  Lifestyle  . Physical activity:    Days per week: Not on file    Minutes per session: Not on file  . Stress: Not on file  Relationships  . Social connections:    Talks on phone: Not on file    Gets together: Not on file    Attends religious service: Not on file    Active member of club or organization: Not on file    Attends meetings of clubs or organizations: Not on file    Relationship status: Not on file  Other Topics Concern  . Not on file  Social History Narrative  . Not on file   Family History  Problem Relation Age of Onset  . Chronic Renal Failure Mother 13       required hemodialysis x 6  months  . Hypertension Mother   . Prostate cancer Brother    Scheduled Meds: . Chlorhexidine Gluconate Cloth  6 each Topical Q0600  . mupirocin ointment  1 application Nasal BID  . sodium chloride flush  3 mL Intravenous Q12H   Continuous Infusions: PRN Meds:.acetaminophen **OR** acetaminophen, ondansetron **OR** ondansetron (ZOFRAN) IV Medications Prior to Admission:  Prior to Admission medications   Medication Sig Start Date End Date Taking? Authorizing Provider  acetaminophen (TYLENOL) 325 MG tablet Take 2 tablets (650 mg total) by mouth every 6 (six) hours as needed for fever or headache (pain). 03/07/19  Yes Barton Dubois, MD  amLODipine (NORVASC) 10 MG tablet Take 10 mg by mouth daily.   Yes [provider]  busPIRone (BUSPAR) 5 MG tablet Take 5 mg by mouth 2 (two) times daily.   Yes [provider]  calcium acetate, Phos Binder, (PHOSLYRA) 667 MG/5ML SOLN Take 667 mg by mouth 3 (three) times daily with meals.   Yes [provider]  colchicine 0.6 MG tablet Take 0.5 tablets (0.3 mg total) by mouth daily. 03/07/19  Yes Barton Dubois, MD  docusate sodium (COLACE) 100 MG capsule Take 1  capsule (100 mg total) by mouth 2 (two) times daily. 03/07/19  Yes Barton Dubois, MD  hydrocortisone (ANUSOL-HC) 25 MG suppository Place 25 mg rectally 2 (two) times daily as needed for hemorrhoids or anal itching.   Yes [provider]  multivitamin (RENA-VIT) TABS tablet Take 1 tablet by mouth daily.   Yes [provider]  patiromer (VELTASSA) 8.4 g packet Take 1 packet (8.4 g total) by mouth daily. 03/07/19  Yes Barton Dubois, MD  polyethylene glycol [facility] / GLYCOLAX) packet Take 17 g by mouth daily. Patient taking differently: Take 17 g by mouth daily as needed for mild constipation.  03/07/19  Yes Barton Dubois, MD  QUEtiapine (SEROQUEL) 25 MG tablet Take 0.5 tablets (12.5 mg total) by mouth 2 (two) times daily. 03/07/19  Yes Barton Dubois, MD  sodium bicarbonate 650 MG tablet Take 1 tablet (650 mg total) by mouth 3 (three) times daily. 03/07/19  Yes Barton Dubois, MD  traMADol (ULTRAM) 50 MG tablet Take 50 mg by mouth 3 (three) times daily.   Yes [provider]   Allergies  Allergen Reactions  . Influenza Vaccines Anaphylaxis  . Menactra [Meningococcal A C Y&W-135 Conj]    Review of Systems  Unable to perform ROS: Mental status change   Physical Exam Vitals signs and nursing note reviewed.  Constitutional:      General: She is awake.     Appearance: She is ill-appearing.  HENT:     Head: Normocephalic and atraumatic.  Cardiovascular:     Rate and Rhythm: Normal rate.  Pulmonary:     Effort: No tachypnea, accessory muscle usage or respiratory distress.  Abdominal:     Tenderness: There is no abdominal tenderness.  Skin:    General: Skin is warm and dry.  Neurological:     Mental Status: She is alert.     Comments: Awake, alert but disoriented  Psychiatric:        Attention and Perception: She is inattentive.        Speech: Speech is delayed.        Cognition and Memory: Cognition is impaired.    Vital Signs: BP (!) 153/78 (BP Location:  Left Arm)   Pulse 93   Temp 97.9 F (36.6 C) (Oral)   Resp 16   Ht 5'  1" (1.549 m)   Wt 75.2 kg   SpO2 98%   BMI 31.32 kg/m  Pain Scale: PAINAD   Pain Score: 0-No pain   SpO2: SpO2: 98 % O2 Device:SpO2: 98 % O2 Flow Rate: .   IO: Intake/output summary:   Intake/Output Summary (Last 24 hours) at 04/02/2019 1625 Last data filed at 04/02/2019 1300 Gross per 24 hour  Intake 417.53 ml  Output -  Net 417.53 ml    LBM:   Baseline Weight: Weight: 77.1 kg Most recent weight: Weight: 75.2 kg     Palliative Assessment/Data:PPS 30%     Time In/out: 1420-1440, 1600-1640 Time Total: 60 Greater than 50%  of this time was spent counseling and coordinating care related to the above assessment and plan.  Signed by:  Ihor Dow, FNP-C Palliative Medicine Team  Phone: [phone] Fax: [phone]   Please contact Palliative Medicine Team phone at [phone] for questions and concerns.  For individual provider: See Shea Evans

## 2019-04-02 NOTE — Progress Notes (Signed)
PROGRESS NOTE   Kristen Kaufman  UDJ:[id number]  DOB: November 04, 1931  DOA: 04/01/2019 PCP: Sherrilee Gilles, DO   Brief Admission Hx: 85 year old female with stage V CKD, diabetes mellitus, hypertension, gets affective disorder was sent from nursing home with worsening renal failure associated with confusion and presumed uremia.  MDM/Assessment & Plan:   1. Acute kidney injury on CKD stage V- patient presented with confusion which is concerning for uremia.  The ED contacted our nephrology team who recommended medical admission.  She was gently hydrated overnight.  She has no specific complaints this morning.  Patient likely is nearing hemodialysis and likely will need access placement.  Will defer to our inpatient nephrology team. 2. Acute metabolic encephalopathy-likely secondary to uremia from acute on chronic renal failure.  She seems to have improved with gentle hydration overnight.  Her Seroquel is temporarily being held. 3. Essential hypertension-poorly controlled, she has been resumed on amlodipine and will follow. 4. Leukocytosis-patient not having fever or signs of infection.  Her WBC is trending down.  Her urinalysis culture is pending.  Follow clinically.  Follow CBC. 5. Bipolar/schizoaffective disorder- Seroquel is temporarily being held in the setting of encephalopathy.  DVT prophylaxis: SCDs Code Status: Full Family Communication: Patient at bedside, will try to contact family by telephone Disposition Plan: Inpatient   Consultants:  Nephrology  Procedures:  N/A  Antimicrobials:  N/A  Subjective: Patient is without specific complaints other than wanting to eat breakfast this morning.  She denies chest pain and shortness of breath.  She is less confused this morning.  She is aware that she is in the hospital.  Objective: Vitals:   04/01/19 2040 04/01/19 2100 04/02/19 0500 04/02/19 0525  BP:  (!) 147/88  (!) 153/78  Pulse: 95 97  93  Resp: 16 18  16   Temp:  98.3 F  (36.8 C)  97.9 F (36.6 C)  TempSrc:  Oral  Oral  SpO2: 96%   98%  Weight:   75.2 kg   Height:        Intake/Output Summary (Last 24 hours) at 04/02/2019 5885 Last data filed at 04/02/2019 0300 Gross per 24 hour  Intake 417.53 ml  Output -  Net 417.53 ml   Filed Weights   04/01/19 1807 04/02/19 0500  Weight: 77.1 kg 75.2 kg     REVIEW OF SYSTEMS  As per history otherwise all reviewed and reported negative  Exam:  General exam: Chronically ill-appearing female she is lying in the bed she is in no apparent distress and she is a cooperative for the examination. Respiratory system: Bilateral breath sounds no crackles heard.  No increased work of breathing. Cardiovascular system: Normal S1 & S2 heard. Gastrointestinal system: Abdomen is nondistended, soft and nontender. Normal bowel sounds heard. Central nervous system: Alert and oriented. No focal neurological deficits. Extremities: Trace pretibial edema bilateral lower extremities.  Data Reviewed: Basic Metabolic Panel: Recent Labs  Lab 04/01/19 1839 04/02/19 0551  NA 141 144  K 4.8 4.9  CL 105 108  CO2 24 24  GLUCOSE 109* 97  BUN 120* 106*  CREATININE 7.49* 7.19*  CALCIUM 8.6* 8.5*   Liver Function Tests: No results for input(s): AST, ALT, ALKPHOS, BILITOT, PROT, ALBUMIN in the last 168 hours. No results for input(s): LIPASE, AMYLASE in the last 168 hours. No results for input(s): AMMONIA in the last 168 hours. CBC: Recent Labs  Lab 04/01/19 1839 04/02/19 0551  WBC 13.1* 11.7*  NEUTROABS 9.7* 8.1*  HGB 10.7* 10.4*  HCT 36.4 35.3*  MCV 99.5 99.2  PLT 284 256   Cardiac Enzymes: No results for input(s): CKTOTAL, CKMB, CKMBINDEX, TROPONINI in the last 168 hours. CBG (last 3)  No results for input(s): GLUCAP in the last 72 hours. Recent Results (from the past 240 hour(s))  MRSA PCR Screening     Status: Abnormal   Collection Time: 04/01/19  8:59 PM  Result Value Ref Range Status   MRSA by PCR POSITIVE  (A) NEGATIVE Final    Comment:        The GeneXpert MRSA Assay (FDA approved for NASAL specimens only), is one component of a comprehensive MRSA colonization surveillance program. It is not intended to diagnose MRSA infection nor to guide or monitor treatment for MRSA infections. RESULT CALLED TO, READ BACK BY AND VERIFIED WITH: R TATE,RN @2350  04/01/19 [facility] Performed at [facility], [address] [place], [place] [address]     Studies: Dg Chest [facility] 1 View  Result Date: 04/01/2019 CLINICAL DATA:  Weakness EXAM: PORTABLE CHEST 1 VIEW COMPARISON:  03/18/2019 FINDINGS: There is bilateral perihilar linear airspace disease likely reflecting atelectasis. There is no focal parenchymal opacity. There is no pleural effusion or pneumothorax. The heart and mediastinal contours are unremarkable. The osseous structures are unremarkable. IMPRESSION: No active disease. Electronically Signed   By: Kathreen Devoid   On: 04/01/2019 19:30     Scheduled Meds: . amLODipine  10 mg Oral Daily  . Chlorhexidine Gluconate Cloth  6 each Topical Q0600  . mupirocin ointment  1 application Nasal BID  . sodium chloride flush  3 mL Intravenous Q12H   Continuous Infusions:  Principal Problem:   Acute renal failure superimposed on stage 5 chronic kidney disease, not on chronic dialysis ([place]) Active Problems:   Essential hypertension   CAD (coronary artery disease)   Acute metabolic encephalopathy   Leukocytosis   Uremia   Pressure injury of skin  Time spent:   Irwin Brakeman, MD [facility] 04/02/2019, 9:29 AM    LOS: 1 day  How to contact the [facility] Attending or Consulting provider [place] or covering provider during after hours [place], for this patient?  1. Check the care team in [facility] and look for a) attending/consulting TR[facility] provider listed and b) the [facility] team listed 2. Log into [email or web address] and use [place]'s universal password to access. If you do not have the password, please contact  the hospital operator. 3. Locate the [facility] provider you are looking for under [facility] and page to a number that you can be directly reached. 4. If you still have difficulty reaching the provider, please page the [facility] (Director on Call) for the Hospitalists listed on amion for assistance.

## 2019-04-02 NOTE — Progress Notes (Signed)
PMT consult received and chart reviewed. Patient assessment completed. Ms. Depierro is awake but disoriented. No s/s of pain or discomfort. No family at bedside during NP visit. Spoke with son, Sariyah Corcino, via telephone. [name] confirms decision against initiation of dialysis and focus on comfort/quality of life. Introduced hospice philosophy and options. [name] is coming from [employer] tomorrow afternoon. PMT NP f/u with son, [name] tomorrow around 1pm to further discuss hospice services and plan of care moving forward.   Full consult note to follow.   NO CHARGE  Ihor Dow, FNP-C Palliative Medicine Team  Phone: [phone] Fax: [phone]

## 2019-04-02 NOTE — Progress Notes (Signed)
Discussed at length with son [name].  He is already discussed this with his sister.  He agrees to his mother is not a dialysis candidate and would do poorly with dialysis.  He wishes comfort care.  I will ask nurse to contact Dr. Irwin Brakeman and set up patient with palliative care services.

## 2019-04-02 NOTE — Progress Notes (Addendum)
04/02/2019 2:42 PM  I spoke with patient's son regarding visitation policy for comfort care end of life patients.  He plans to come visit from [employer]. Husband will come visit today.  Son says all his questions answered at this time and he seems satisfied. I updated nursing staff.    Murvin Natal MD

## 2019-04-03 DIAGNOSIS — Z515 Encounter for palliative care: Secondary | ICD-10-CM

## 2019-04-03 DIAGNOSIS — Z7189 Other specified counseling: Secondary | ICD-10-CM

## 2019-04-03 DIAGNOSIS — N186 End stage renal disease: Secondary | ICD-10-CM

## 2019-04-03 NOTE — Clinical Social Work Note (Signed)
Patient Information   Patient Name Kristen Kaufman, Kristen Kaufman ([id number]) Sex Female DOB 15-Jun-1931  Room Bed  A332 A332-01  Patient Demographics   Address [place] [address] Phone [phone] (Home)  Patient Ethnicity & Race   Ethnic Group Patient Race  Not Hispanic or Latino Black or African American  Emergency Contact(s)   Name Relation Home Work Mobile  Doe,Robert Significant other [phone]    Kristen Kaufman, Kristen Kaufman [phone]    Documents on File    Status Date Received Description  Documents for the Patient  [place] Not Received    [place] E-Signature HIPAA Notice of Privacy Signed 73/22/02   Driver's License Received 54/27/06 Drivers License  Insurance Card Received 23/76/28 Drivers License  Advance Directives/Living Will/HCPOA/POA Not Received    Other Photo ID Not Received    Insurance Card Received 10/05/16 UHC card  HIM ROI Authorization  06/06/17 [employer]  HIM ROI Authorization  06/12/17 [facility] CARE  HIM ROI Authorization  06/13/17 [place]  07/19/17 EM PA  [place] E-Signature HIPAA Notice of Privacy Signed 03/01/19   HIM ROI Authorization Received 03/20/19 [place]  Patient Photo   Photo of Patient  HIM Release of Information Output (Deleted) 03/20/19 Requested records  Documents for the Encounter  AOB (Assignment of Insurance Benefits) Received 04/01/19 Verbal Consent 4.13.20  E-signature AOB     MEDICARE RIGHTS Not Received    E-signature Medicare Rights Received 04/01/19   Cardiac Monitoring Strip Received 04/01/19   ED Patient Billing Extract   ED PB Billing Extract  Cardiac Monitoring Strip Shift Summary Received 04/02/19   Admission Information   Current Information   Attending Provider Admitting Provider Admission Type Admission Status  Kathie Dike, MD Vianne Bulls, MD  Emergency Admission (Confirmed)       Admission Date/Time Discharge Date Hospital Service Auth/Cert Status  31/51/76 06:09 PM  Internal Medicine Cabery Unit Room/Bed   [facility] AP-DEPT 300 H607/P710-62        Admission   Complaint  Multiple Complains sent by [place] Account   Name Acct ID Class Status Primary Coverage  [place], [place] [id number] Inpatient Open UNITED HEALTHCARE MEDICARE - [place]      Guarantor Account (for Hospital Account [id number])   Name Relation to Pt Service Area Active? Acct Type  Anselm Pancoast Self [facility]SA Yes Personal/Family  Address Phone    [place] [address] [phone])        Coverage Information (for Hospital Account [id number])   1. UNITED HEALTHCARE MEDICARE/UNITEDHEALTHCARE DUAL COMPLETE   F/O Payor/Plan Precert #  [facility] MEDICARE/UNITEDHEALTHCARE DUAL COMPLETE   Subscriber Subscriber #  [place], [place] [id number]  Address [place]  [address] [place] [place] [address] [phone]  2. MEDICAID ADVANTAGE OUT OF STATE/UHC MEDICAID ADVANTAGE   F/O Payor/Plan Precert #  MEDICAID ADVANTAGE OUT OF STATE/UHC MEDICAID ADVANTAGE   Subscriber Subscriber #  [place], [place] [id number]  Address Phone  [place] [address] [place] [address]

## 2019-04-03 NOTE — Progress Notes (Signed)
Daily Progress Note   Patient Name: Kristen Kaufman       Date: 04/03/2019 DOB: [birth year]  Age: 85 y.o. MRN#: [id number] Attending Physician: Kathie Dike, MD Primary Care Physician: Sherrilee Gilles, DO Admit Date: 04/01/2019  Reason for Consultation/Follow-up: Establishing goals of care and Terminal Care  Subjective: Patient wakes to voice. She is oriented to name, otherwise disoriented. Pleasantly confused. No s/s of pain or discomfort.   GOC:  Significant other at bedside. He does not have questions. He tells me Kristen Kaufman will be here this afternoon.   Shortly after, spoke with son Kristen Kaufman via telephone. He is still in [place] and tells me he will not be at the hospital until after Kristen Kaufman requests we meet in AM. Kristen Kaufman explains that he is getting paperwork together for her to return back to SNF. Explained another option for transfer to hospice facility, further explaining hospice philosophy. Explained concern that with progressive kidney failure, she will not do well with further aggressive therapy at SNF. Educated on EOL expectations with kidney failure. Kristen Kaufman speaks of wanting his mother to do what she can at Kristen Kaufman and will "cross that bridge when it comes" in regards to transferring her to hospice facility if she absolutely cannot work further with therapy. Explained high risk for recurrent hospitalization with progressive kidney failure. Kristen Kaufman shares that the SNF understands that her kidneys are getting worse and will not be quick with transfer back to the hospital. Also that he could have further discussions with them about transfer to hospice facility. Kristen Kaufman wishes to further discuss this with PMT provider tomorrow.   Length of Stay: 2  Current Medications: Scheduled Meds:  . Chlorhexidine  Gluconate Cloth  6 each Topical Q0600  . mupirocin ointment  1 application Nasal BID  . sodium chloride flush  3 mL Intravenous Q12H    Continuous Infusions:   PRN Meds: acetaminophen **OR** acetaminophen, ondansetron **OR** ondansetron (ZOFRAN) IV  Physical Exam Vitals signs and nursing note reviewed.  Constitutional:      Appearance: She is ill-appearing.  HENT:     Head: Normocephalic and atraumatic.  Pulmonary:     Effort: No tachypnea, accessory muscle usage or respiratory distress.  Abdominal:     Tenderness: There is no abdominal tenderness.  Skin:    General: Skin is warm and dry.  Neurological:     Mental Status: She is easily aroused.     Comments: Wakes to voice. Oriented to self, otherwise disoriented  Psychiatric:        Attention and Perception: She is inattentive.        Speech: Speech is delayed.        Cognition and Memory: Cognition is impaired.            Vital Signs: BP (!) 153/78 (BP Location: Left Arm)   Pulse 93   Temp 97.9 F (36.6 C) (Oral)   Resp 16   Ht 5\' 1"  (1.549 m)   Wt 75.2 kg   SpO2 98%   BMI 31.32 kg/m  SpO2: SpO2: 98 % O2 Device: O2 Device: Room Air O2 Flow Rate:    Intake/output summary:   Intake/Output Summary (Last 24 hours) at 04/03/2019 1115 Last data filed at 04/03/2019 0900 Gross per 24 hour  Intake 120 ml  Output 100 ml  Net 20 ml   LBM:   Baseline Weight: Weight: 77.1 kg Most recent weight: Weight: 75.2 kg       Palliative Assessment/Data: PPS 30%     Patient Active Problem List   Diagnosis Date Noted  . Palliative care by specialist   . Goals of care, counseling/discussion   . Pressure injury of skin 04/02/2019  . Acute renal failure superimposed on stage 5 chronic kidney disease, not on chronic dialysis ([place]) 04/01/2019  . Acute metabolic encephalopathy 60/73/7106  . Leukocytosis 04/01/2019  . Uremia   . CAP (community acquired pneumonia) 03/01/2019  . Acute lower UTI 03/01/2019  . Rheumatoid  arthritis ([place]) 06/12/2018  . Chest pain 06/12/2018  . Gout 02/13/2018  . CAD (coronary artery disease) 02/13/2018  . Constipation 06/13/2017  . AKI (acute kidney injury) ([place]) 05/30/2017  . UTI (urinary tract infection), uncomplicated 26/94/8546  . UTI (urinary tract infection) 05/25/2017  . Hyperkalemia 05/25/2017  . Acute renal failure superimposed on stage 4 chronic kidney disease ([place])   . Anemia in stage 4 chronic kidney disease ([place])   . Renal insufficiency 05/24/2017  . Diabetes mellitus type 2 in obese ([place]) 05/24/2017  . Essential hypertension 05/24/2017    Palliative Care Assessment & Plan   Patient Profile: 85 y.o. female  with past medical history of CKD stage V, DM, HTN, MI, bipolar disorder, schizoaffective disorder admitted on 04/01/2019 with worsening renal functions and confusion. Patient presented with confusion concerning for uremia. BUN/Creat 120/7.49. Gentle hydration initiated. Nephrology consulted and has discussed with family poor candidacy for dialysis. Family decision to not initiate dialysis and shift towards comfort focused care. Palliative medicine consultation for GOC/EOL care.   Assessment: Acute kidney injury superimposed on CKD stage V Uremia Acute metabolic encephalopathy Decreased UOP Essential hypertension Leukocytosis Bipolar/schizoaffective disorder  Recommendations/Plan:  DNR  Family decision against initiation of dialysis.   Poor prognosis likely weeks with uremia, poor functional/nutritional/cognitive status, and poor urine output. Son unavailable to f/u with PMT provider today. Requests we meet tomorrow morning, around 10am.   Code Status: DNR   Code Status Orders  (From admission, onward)         Start     Ordered   04/02/19 1523  Do not attempt resuscitation (DNR)  Continuous    Question Answer Comment  In the event of cardiac or respiratory ARREST Do not call a "code blue"   In the event of cardiac or respiratory ARREST Do  not perform Intubation, CPR, defibrillation or ACLS  In the event of cardiac or respiratory ARREST Use medication by any route, position, wound care, and other measures to relive pain and suffering. May use oxygen, suction and manual treatment of airway obstruction as needed for comfort.      04/02/19 1522        Code Status History    Date Active Date Inactive Code Status Order ID Comments User Context   04/01/2019 2038 04/02/2019 1522 Full Code [id number]  Vianne Bulls, MD ED   03/01/2019 2221 03/07/2019 1656 Full Code [id number]  Truett Mainland, DO Inpatient   06/12/2018 0127 06/12/2018 1728 DNR [id number]  Heath Lark D, DO ED   02/13/2018 2356 02/15/2018 1927 DNR [id number]  Reubin Milan, MD ED   06/13/2017 1926 06/15/2017 1414 DNR [id number]  Reubin Milan, MD Inpatient   05/30/2017 2108 06/03/2017 1551 DNR [id number]  Reubin Milan, MD Inpatient   05/24/2017 2021 05/27/2017 1625 DNR [id number]  Karmen Bongo, MD Inpatient       Prognosis:   Poor prognosis likely weeks with AKI superimposed on CKD stage V, worsening kidney functions, poor urine output, and uremia. Family decision against initiation of dialysis and focus on comfort.   Discharge Planning:  To Be Determined  Care plan was discussed with RN, Dr. Roderic Palau, husband, son Kristen Kaufman)  Thank you for allowing the Palliative Medicine Team to assist in the care of this patient.   Time In: 1100- 1400-  Time Out: 1110 1415 Total Time 25 Prolonged Time Billed no      Greater than 50%  of this time was spent counseling and coordinating care related to the above assessment and plan.  Ihor Dow, FNP-C Palliative Medicine Team  Phone: [phone] Fax: [phone]  Please contact Palliative Medicine Team phone at [phone] for questions and concerns.

## 2019-04-03 NOTE — Progress Notes (Signed)
PROGRESS NOTE   Kristen Kaufman  RFF:[id number]  DOB: 02-06-1931  DOA: 04/01/2019 PCP: Sherrilee Gilles, DO   Brief Admission Hx: 85 year old female with stage V CKD, diabetes mellitus, hypertension, gets affective disorder was sent from nursing home with worsening renal failure associated with confusion and presumed uremia.  MDM/Assessment & Plan:   1. Acute kidney injury on CKD stage V- patient presented with confusion which is concerning for uremia.  The ED contacted our nephrology team who recommended medical admission.  Seen by nephrology, not felt to be candidate for dialysis.  Family is in agreement with this. 2. Acute metabolic encephalopathy-likely secondary to uremia from acute on chronic renal failure.  She remains confused 3. Essential hypertension-poorly controlled, she has been resumed on amlodipine and will follow. 4. Leukocytosis-patient not having fever or signs of infection.  Her WBC is trending down. 5. Bipolar/schizoaffective disorder- Seroquel is temporarily being held in the setting of encephalopathy. 6. Goals of care.  Seen by palliative care services.  Since she has advanced renal failure is not a candidate for dialysis, discussions are being held regarding quality of life and the potential for hospice.  DVT prophylaxis: SCDs Code Status: DNR Family Communication: No family present.  Communication mostly through palliative care to family. Disposition Plan: Determination of best discharge disposition, return to nursing facility versus residential hospice   Consultants:  Nephrology  Palliative care  Procedures:  N/A  Antimicrobials:  N/A  Subjective: She knows she is in the hospital, but does not know why she is in the hospital, she does not know the date.  She says she does not feel well.  Objective: Vitals:   04/01/19 2040 04/01/19 2100 04/02/19 0500 04/02/19 0525  BP:  (!) 147/88  (!) 153/78  Pulse: 95 97  93  Resp: 16 18  16   Temp:  98.3 F (36.8  C)  97.9 F (36.6 C)  TempSrc:  Oral  Oral  SpO2: 96%   98%  Weight:   75.2 kg   Height:        Intake/Output Summary (Last 24 hours) at 04/03/2019 1839 Last data filed at 04/03/2019 0900 Gross per 24 hour  Intake 0 ml  Output 100 ml  Net -100 ml   Filed Weights   04/01/19 1807 04/02/19 0500  Weight: 77.1 kg 75.2 kg     REVIEW OF SYSTEMS  As per history otherwise all reviewed and reported negative  Exam:  General exam: Alert, awake, confused Respiratory system: Clear to auscultation. Respiratory effort normal. Cardiovascular system:RRR. No murmurs, rubs, gallops. Gastrointestinal system: Abdomen is nondistended, soft and nontender. No organomegaly or masses felt. Normal bowel sounds heard. Central nervous system:  No focal neurological deficits. Extremities: No C/C/E, +pedal pulses Skin: No rashes, lesions or ulcers Psychiatry: Confused, pleasant   Data Reviewed: Basic Metabolic Panel: Recent Labs  Lab 04/01/19 1839 04/02/19 0551  NA 141 144  K 4.8 4.9  CL 105 108  CO2 24 24  GLUCOSE 109* 97  BUN 120* 106*  CREATININE 7.49* 7.19*  CALCIUM 8.6* 8.5*   Liver Function Tests: No results for input(s): AST, ALT, ALKPHOS, BILITOT, PROT, ALBUMIN in the last 168 hours. No results for input(s): LIPASE, AMYLASE in the last 168 hours. No results for input(s): AMMONIA in the last 168 hours. CBC: Recent Labs  Lab 04/01/19 1839 04/02/19 0551  WBC 13.1* 11.7*  NEUTROABS 9.7* 8.1*  HGB 10.7* 10.4*  HCT 36.4 35.3*  MCV 99.5 99.2  PLT 284 256  Cardiac Enzymes: No results for input(s): CKTOTAL, CKMB, CKMBINDEX, TROPONINI in the last 168 hours. CBG (last 3)  No results for input(s): GLUCAP in the last 72 hours. Recent Results (from the past 240 hour(s))  MRSA PCR Screening     Status: Abnormal   Collection Time: 04/01/19  8:59 PM  Result Value Ref Range Status   MRSA by PCR POSITIVE (A) NEGATIVE Final    Comment:        The GeneXpert MRSA Assay (FDA approved  for NASAL specimens only), is one component of a comprehensive MRSA colonization surveillance program. It is not intended to diagnose MRSA infection nor to guide or monitor treatment for MRSA infections. RESULT CALLED TO, READ BACK BY AND VERIFIED WITH: R TATE,RN @2350  04/01/19 [facility] Performed at [facility], [address] [place],  [address]     Studies: Dg Chest [facility] 1 View  Result Date: 04/01/2019 CLINICAL DATA:  Weakness EXAM: PORTABLE CHEST 1 VIEW COMPARISON:  03/18/2019 FINDINGS: There is bilateral perihilar linear airspace disease likely reflecting atelectasis. There is no focal parenchymal opacity. There is no pleural effusion or pneumothorax. The heart and mediastinal contours are unremarkable. The osseous structures are unremarkable. IMPRESSION: No active disease. Electronically Signed   By: Kathreen Devoid   On: 04/01/2019 19:30     Scheduled Meds: . Chlorhexidine Gluconate Cloth  6 each Topical Q0600  . mupirocin ointment  1 application Nasal BID  . sodium chloride flush  3 mL Intravenous Q12H   Continuous Infusions:  Principal Problem:   Acute renal failure superimposed on stage 5 chronic kidney disease, not on chronic dialysis ([facility]) Active Problems:   Essential hypertension   CAD (coronary artery disease)   Acute metabolic encephalopathy   Leukocytosis   Uremia   Pressure injury of skin   Palliative care by specialist   Goals of care, counseling/discussion   ESRD (end stage renal disease) ([place])  Time spent: 17mins  Kathie Dike, MD [facility] 04/03/2019, 6:39 PM    LOS: 2 days  How to contact the [facility] Attending or Consulting provider [place] or covering provider during after hours [place], for this patient?

## 2019-04-03 NOTE — Progress Notes (Signed)
[facility] ROUNDING NOTE   Subjective:   85 year old lady with chronic renal sufficiency stage V.  She has had stage V kidney disease since 2019 February.  No plans for dialysis have been made by her primary nephrologist.  Discussing with the family they do not wish to proceed with dialysis.  Comfort care has been ordered  Blood pressure 153/78 pulse 90 temperature 97.9 O2 sats 98% room air urine output 100 cc weight 75.2 kg  Sodium 144 potassium 4.9 chloride 108 CO2 24 BUN 106 creatinine 7.19 glucose 97 calcium 8.5 WBC 11.7 hemoglobin 10.4 platelets 256    Objective:  Vital signs in last 24 hours:     Weight change:  Filed Weights   04/01/19 1807 04/02/19 0500  Weight: 77.1 kg 75.2 kg    Intake/Output: I/O last 3 completed shifts: In: 537.5 [P.O.:120; I.V.:417.5] Out: 100 [Urine:100]   Intake/Output this shift:  No intake/output data recorded.  CVS-regular rate and rhythm systolic murmur 3/6 left sternal edge RS-obese diminished air entry at bases ABD- BS present soft non-distended EXT- no edema   Basic Metabolic Panel: Recent Labs  Lab 04/01/19 1839 04/02/19 0551  NA 141 144  K 4.8 4.9  CL 105 108  CO2 24 24  GLUCOSE 109* 97  BUN 120* 106*  CREATININE 7.49* 7.19*  CALCIUM 8.6* 8.5*    Liver Function Tests: No results for input(s): AST, ALT, ALKPHOS, BILITOT, PROT, ALBUMIN in the last 168 hours. No results for input(s): LIPASE, AMYLASE in the last 168 hours. No results for input(s): AMMONIA in the last 168 hours.  CBC: Recent Labs  Lab 04/01/19 1839 04/02/19 0551  WBC 13.1* 11.7*  NEUTROABS 9.7* 8.1*  HGB 10.7* 10.4*  HCT 36.4 35.3*  MCV 99.5 99.2  PLT 284 256    Cardiac Enzymes: No results for input(s): CKTOTAL, CKMB, CKMBINDEX, TROPONINI in the last 168 hours.  BNP: Invalid input(s): POCBNP  CBG: No results for input(s): GLUCAP in the last 168 hours.  Microbiology: Results for orders placed or performed during the  hospital encounter of 04/01/19  MRSA PCR Screening     Status: Abnormal   Collection Time: 04/01/19  8:59 PM  Result Value Ref Range Status   MRSA by PCR POSITIVE (A) NEGATIVE Final    Comment:        The GeneXpert MRSA Assay (FDA approved for NASAL specimens only), is one component of a comprehensive MRSA colonization surveillance program. It is not intended to diagnose MRSA infection nor to guide or monitor treatment for MRSA infections. RESULT CALLED TO, READ BACK BY AND VERIFIED WITH: R TATE,RN @2350  04/01/19 [facility] Performed at [facility], [address] [place], [place] [address]     Coagulation Studies: Recent Labs    04/02/19 0957  LABPROT 15.8*  INR 1.3*    Urinalysis: No results for input(s): COLORURINE, LABSPEC, PHURINE, GLUCOSEU, HGBUR, BILIRUBINUR, KETONESUR, PROTEINUR, UROBILINOGEN, NITRITE, LEUKOCYTESUR in the last 72 hours.  Invalid input(s): APPERANCEUR    Imaging: Dg Chest Port 1 View  Result Date: 04/01/2019 CLINICAL DATA:  Weakness EXAM: PORTABLE CHEST 1 VIEW COMPARISON:  03/18/2019 FINDINGS: There is bilateral perihilar linear airspace disease likely reflecting atelectasis. There is no focal parenchymal opacity. There is no pleural effusion or pneumothorax. The heart and mediastinal contours are unremarkable. The osseous structures are unremarkable. IMPRESSION: No active disease. Electronically Signed   By: Kathreen Devoid   On: 04/01/2019 19:30     Medications:    . Chlorhexidine Gluconate Cloth  6 each Topical Q0600  . mupirocin ointment  1 application Nasal BID  . sodium chloride flush  3 mL Intravenous Q12H   acetaminophen **OR** acetaminophen, ondansetron **OR** ondansetron (ZOFRAN) IV  Assessment/ Plan:   Chronic kidney disease stage V.  Patient is not a candidate for dialysis she would not want dialysis she has had discussions with the family members about quality versus quantity of life.  There does not appear to been any plans made for  dialysis by her primary nephrologist.  It does not appear that the patient would have wanted to proceed with this in view of her advanced age and comorbid factors.  Discussion at length with the son [name] revealed that he would not want his mother to be put on dialysis treatments.  I will therefore sign off.  Please do not hesitate to call for further questions or concerns    LOS: 2 Sherril Croon @TODAY @10 :03 AM

## 2019-04-04 NOTE — Progress Notes (Signed)
Daily Progress Note   Patient Name: Kristen Kaufman       Date: 04/04/2019 DOB: 07/10/1931  Age: 85 y.o. MRN#: [id number] Attending Physician: Kathie Dike, MD Primary Care Physician: Sherrilee Gilles, DO Admit Date: 04/01/2019  Reason for Consultation/Follow-up: Establishing goals of care and Terminal Care  Subjective: Patient wakes to voice. She is oriented to name, pleasantly confused. Complains of left knee discomfort. Hx of arthritis and gout.   GOC:   F/u with son, Nazly Digilio, in family waiting room.   Introduced Palliative Medicine as specialized medical care for people living with serious illness. It focuses on providing relief from the symptoms and stress of a serious illness.  Discussed events leading up to hospitalization and course of hospital diagnoses, interventions, and prognosis. Confirmed decision made this admission to NOT initiate hemodialysis. [name] shares his understanding that his mother is very weak and would not tolerate dialysis.   Discussed poor prognosis and eligibility for hospice services (6 month or less prognosis) with worsening kidney function, uremia, and declining functional/cognitive/nutritional status. The natural disease trajectory and expectations at EOL were discussed.  Discussed concern that with progressive kidney failure, his mother will likely not tolerate aggressive physical therapy. Introduced hospice philosophy and options. Emphasized role of comfort with focus on quality of life and symptom management. Explained concern that with progressive kidney failure, high risk for recurrent hospitalization.   [name] shares his feels that hospice meaning "giving up." He shares that his mother has lived over a year with progressive decline in kidney functions  and his hope that she could live another year. He is a spiritual individual and places this decision in God's hands. [name] acknowledges that he does not wish to see his mother "suffer" but his desire to continue physical therapy, even if it is just her getting to the chair. He sees hope that she is still urinating, therefore kidneys have some function. Also he is encouraged by the fact that she is less confused today and eating bites and sips for him, especially ice cream.   Explained that this will be a period of waxing/waning with uremic symptoms.   [name] appreciates information but does request to further discuss plan of care and prognosis with attending. Updated Dr. Roderic Palau. Also [name] plans to further discuss plan with family.  Questions and concerns were  addressed.  Hard Choices booklet left for review.   Length of Stay: 3  Current Medications: Scheduled Meds:  . Chlorhexidine Gluconate Cloth  6 each Topical Q0600  . mupirocin ointment  1 application Nasal BID  . sodium chloride flush  3 mL Intravenous Q12H    Continuous Infusions:   PRN Meds: acetaminophen **OR** acetaminophen, ondansetron **OR** ondansetron (ZOFRAN) IV  Physical Exam Vitals signs and nursing note reviewed.  Constitutional:      Appearance: She is ill-appearing.  HENT:     Head: Normocephalic and atraumatic.  Pulmonary:     Effort: No tachypnea, accessory muscle usage or respiratory distress.  Abdominal:     Tenderness: There is no abdominal tenderness.  Skin:    General: Skin is warm and dry.  Neurological:     Mental Status: She is easily aroused.     Comments: Wakes to voice. Oriented to self, otherwise disoriented. Generalized weakness  Psychiatric:        Attention and Perception: She is inattentive.        Speech: Speech is delayed.        Cognition and Memory: Cognition is impaired.            Vital Signs: BP (!) 153/78 (BP Location: Left Arm)   Pulse 93   Temp 97.9 F (36.6 C) (Oral)    Resp 16   Ht 5\' 1"  (1.549 m)   Wt 75.2 kg   SpO2 92%   BMI 31.32 kg/m  SpO2: SpO2: 92 % O2 Device: O2 Device: Room Air O2 Flow Rate:    Intake/output summary:   Intake/Output Summary (Last 24 hours) at 04/04/2019 1049 Last data filed at 04/04/2019 0916 Gross per 24 hour  Intake 245 ml  Output -  Net 245 ml   LBM:   Baseline Weight: Weight: 77.1 kg Most recent weight: Weight: 75.2 kg       Palliative Assessment/Data: PPS 30%     Patient Active Problem List   Diagnosis Date Noted  . Palliative care by specialist   . Goals of care, counseling/discussion   . ESRD (end stage renal disease) ([place])   . Pressure injury of skin 04/02/2019  . Acute renal failure superimposed on stage 5 chronic kidney disease, not on chronic dialysis ([place]) 04/01/2019  . Acute metabolic encephalopathy 27/05/2375  . Leukocytosis 04/01/2019  . Uremia   . CAP (community acquired pneumonia) 03/01/2019  . Acute lower UTI 03/01/2019  . Rheumatoid arthritis ([place]) 06/12/2018  . Chest pain 06/12/2018  . Gout 02/13/2018  . CAD (coronary artery disease) 02/13/2018  . Constipation 06/13/2017  . AKI (acute kidney injury) ([place]) 05/30/2017  . UTI (urinary tract infection), uncomplicated 28/31/5176  . UTI (urinary tract infection) 05/25/2017  . Hyperkalemia 05/25/2017  . Acute renal failure superimposed on stage 4 chronic kidney disease ([place])   . Anemia in stage 4 chronic kidney disease ([place])   . Renal insufficiency 05/24/2017  . Diabetes mellitus type 2 in obese ([place]) 05/24/2017  . Essential hypertension 05/24/2017    Palliative Care Assessment & Plan   Patient Profile: 85 y.o. female  with past medical history of CKD stage V, DM, HTN, MI, bipolar disorder, schizoaffective disorder admitted on 04/01/2019 with worsening renal functions and confusion. Patient presented with confusion concerning for uremia. BUN/Creat 120/7.49. Gentle hydration initiated. Nephrology consulted and has discussed with family  poor candidacy for dialysis. Family decision to not initiate dialysis and shift towards comfort focused care. Palliative medicine consultation for GOC/EOL  care.   Assessment: Acute kidney injury superimposed on CKD stage V Uremia Acute metabolic encephalopathy Decreased UOP Essential hypertension Leukocytosis Bipolar/schizoaffective disorder  Recommendations/Plan:  DNR  Family decision against initiation of dialysis.   Dr. Ulice Bold to speak with son this AM. Pending family decision for SNF to pursue further rehab attempts versus hospice.   PMT will follow.   Code Status: DNR   Code Status Orders  (From admission, onward)         Start     Ordered   04/02/19 1523  Do not attempt resuscitation (DNR)  Continuous    Question Answer Comment  In the event of cardiac or respiratory ARREST Do not call a "code blue"   In the event of cardiac or respiratory ARREST Do not perform Intubation, CPR, defibrillation or ACLS   In the event of cardiac or respiratory ARREST Use medication by any route, position, wound care, and other measures to relive pain and suffering. May use oxygen, suction and manual treatment of airway obstruction as needed for comfort.      04/02/19 1522        Code Status History    Date Active Date Inactive Code Status Order ID Comments User Context   04/01/2019 2038 04/02/2019 1522 Full Code [id number]  Vianne Bulls, MD ED   03/01/2019 2221 03/07/2019 1656 Full Code [id number]  Truett Mainland, DO Inpatient   06/12/2018 0127 06/12/2018 1728 DNR [id number]  Heath Lark D, DO ED   02/13/2018 2356 02/15/2018 1927 DNR [id number]  Reubin Milan, MD ED   06/13/2017 1926 06/15/2017 1414 DNR [id number]  Reubin Milan, MD Inpatient   05/30/2017 2108 06/03/2017 1551 DNR [id number]  Reubin Milan, MD Inpatient   05/24/2017 2021 05/27/2017 1625 DNR [id number]  Karmen Bongo, MD Inpatient       Prognosis:   Poor long-term prognosis with AKI superimposed on CKD stage  V, worsening kidney functions, decreased urine output, and uremia. Family decision against initiation of dialysis.  Discharge Planning:  To Be Determined  Care plan was discussed with RN, Dr. Roderic Palau, son Quintasha Gren)  Thank you for allowing the Palliative Medicine Team to assist in the care of this patient.   Time In: 1000 Time Out: 1050 Total Time 50 Prolonged Time Billed no      Greater than 50%  of this time was spent counseling and coordinating care related to the above assessment and plan.  Ihor Dow, FNP-C Palliative Medicine Team  Phone: [phone] Fax: [phone]  Please contact Palliative Medicine Team phone at [phone] for questions and concerns.

## 2019-04-04 NOTE — Progress Notes (Signed)
PROGRESS NOTE   Kristen Kaufman  TMH:[id number]  DOB: [birth year]  DOA: 04/01/2019 PCP: Sherrilee Gilles, DO   Brief Admission Hx: 85 year old female with stage V CKD, diabetes mellitus, hypertension, gets affective disorder was sent from nursing home with worsening renal failure associated with confusion and presumed uremia.  MDM/Assessment & Plan:   1. Acute kidney injury on CKD stage V- patient presented with confusion which is concerning for uremia.  The ED contacted our nephrology team who recommended medical admission.  Seen by nephrology, not felt to be candidate for dialysis.  Family is in agreement with this. 2. Acute metabolic encephalopathy-likely secondary to uremia from acute on chronic renal failure.  She remains confused 3. Essential hypertension-poorly controlled, she has been resumed on amlodipine and will follow. 4. Leukocytosis-patient not having fever or signs of infection.  Her WBC is trending down. 5. Bipolar/schizoaffective disorder- Seroquel is temporarily being held in the setting of encephalopathy. 6. Goals of care.  Seen by palliative care services.  I had a long conversation with the patient's son, Kristen Kaufman.  We discussed the patient's current state of health and expected prognosis.  Since she has progressive renal disease and is not a dialysis candidate, I have recommended hospice services.  He does not appear to be completely on board with hospice services.  He wishes to discuss this further with his family.  DVT prophylaxis: SCDs Code Status: DNR Family Communication: No family present.  Communication mostly through palliative care to family. Disposition Plan: Determination of best discharge disposition, return to nursing facility versus residential hospice   Consultants:  Nephrology  Palliative care  Procedures:  N/A  Antimicrobials:  N/A  Subjective: She complains of some abdominal discomfort and nausea.  No vomiting.  P.o. intake remains poor.   Objective: Vitals:   04/01/19 2100 04/02/19 0500 04/02/19 0525 04/03/19 1938  BP: (!) 147/88  (!) 153/78   Pulse: 97  93   Resp: 18  16   Temp: 98.3 F (36.8 C)  97.9 F (36.6 C)   TempSrc: Oral  Oral   SpO2:   98% 92%  Weight:  75.2 kg    Height:        Intake/Output Summary (Last 24 hours) at 04/04/2019 1934 Last data filed at 04/04/2019 9211 Gross per 24 hour  Intake 245 ml  Output -  Net 245 ml   Filed Weights   04/01/19 1807 04/02/19 0500  Weight: 77.1 kg 75.2 kg     REVIEW OF SYSTEMS  As per history otherwise all reviewed and reported negative  Exam:  General exam: Alert, awake, oriented x 3 Respiratory system: Clear to auscultation. Respiratory effort normal. Cardiovascular system:RRR. No murmurs, rubs, gallops. Gastrointestinal system: Abdomen is nondistended, soft and inconsistently tender in the periumbilical area. No organomegaly or masses felt. Normal bowel sounds heard. Central nervous system: No focal neurological deficits. Extremities: No C/C/E, +pedal pulses Skin: No rashes, lesions or ulcers Psychiatry: Mildly confused, she is not agitated.     Data Reviewed: Basic Metabolic Panel: Recent Labs  Lab 04/01/19 1839 04/02/19 0551  NA 141 144  K 4.8 4.9  CL 105 108  CO2 24 24  GLUCOSE 109* 97  BUN 120* 106*  CREATININE 7.49* 7.19*  CALCIUM 8.6* 8.5*   Liver Function Tests: No results for input(s): AST, ALT, ALKPHOS, BILITOT, PROT, ALBUMIN in the last 168 hours. No results for input(s): LIPASE, AMYLASE in the last 168 hours. No results for input(s): AMMONIA in the last 168 hours. CBC:  Recent Labs  Lab 04/01/19 1839 04/02/19 0551  WBC 13.1* 11.7*  NEUTROABS 9.7* 8.1*  HGB 10.7* 10.4*  HCT 36.4 35.3*  MCV 99.5 99.2  PLT 284 256   Cardiac Enzymes: No results for input(s): CKTOTAL, CKMB, CKMBINDEX, TROPONINI in the last 168 hours. CBG (last 3)  No results for input(s): GLUCAP in the last 72 hours. Recent Results (from the past 240  hour(s))  MRSA PCR Screening     Status: Abnormal   Collection Time: 04/01/19  8:59 PM  Result Value Ref Range Status   MRSA by PCR POSITIVE (A) NEGATIVE Final    Comment:        The GeneXpert MRSA Assay (FDA approved for NASAL specimens only), is one component of a comprehensive MRSA colonization surveillance program. It is not intended to diagnose MRSA infection nor to guide or monitor treatment for MRSA infections. RESULT CALLED TO, READ BACK BY AND VERIFIED WITH: R TATE,RN @2350  04/01/19 [facility] Performed at [facility], [address] [place], [place] [address]     Studies: No results found.   Scheduled Meds: . Chlorhexidine Gluconate Cloth  6 each Topical Q0600  . mupirocin ointment  1 application Nasal BID  . sodium chloride flush  3 mL Intravenous Q12H   Continuous Infusions:  Principal Problem:   Acute renal failure superimposed on stage 5 chronic kidney disease, not on chronic dialysis (HCC) Active Problems:   Essential hypertension   CAD (coronary artery disease)   Acute metabolic encephalopathy   Leukocytosis   Uremia   Pressure injury of skin   Palliative care by specialist   Goals of care, counseling/discussion   ESRD (end stage renal disease) ([place])  Time spent: 8mins  Kathie Dike, MD [facility] 04/04/2019, 7:34 PM    LOS: 3 days

## 2019-04-04 NOTE — Plan of Care (Signed)
°  Problem: Coping: °Goal: Level of anxiety will decrease °Outcome: Progressing °  °

## 2019-04-05 MED ORDER — MORPHINE SULFATE (CONCENTRATE) 10 MG/0.5ML PO SOLN
10.0000 mg | ORAL | Status: DC | PRN
  Administered 2019-04-06: 10 mg via ORAL
  Filled 2019-04-05: qty 0.5

## 2019-04-05 MED ORDER — ALUM & MAG HYDROXIDE-SIMETH 200-200-20 MG/5ML PO SUSP: 30.0000 mL | Freq: Four times a day (QID) | ORAL | Status: DC | PRN

## 2019-04-05 MED ORDER — PHENOL 1.4 % MT LIQD: 1.0000 | OROMUCOSAL | Status: DC | PRN

## 2019-04-05 NOTE — Progress Notes (Signed)
PROGRESS NOTE   Kristen Kaufman  VVO:[id number]  DOB: [birth year]  DOA: 04/01/2019 PCP: Sherrilee Gilles, DO   Brief Admission Hx: 85 year old female with stage V CKD, diabetes mellitus, hypertension, gets affective disorder was sent from nursing home with worsening renal failure associated with confusion and presumed uremia.  MDM/Assessment & Plan:   1. Acute kidney injury on CKD stage V- patient presented with confusion which is concerning for uremia.  The ED contacted our nephrology team who recommended medical admission.  Seen by nephrology, not felt to be candidate for dialysis.  Family is in agreement with this. 2. Acute metabolic encephalopathy-likely secondary to uremia from acute on chronic renal failure.  She remains confused 3. Essential hypertension-poorly controlled, she has been resumed on amlodipine and will follow. 4. Leukocytosis-patient not having fever or signs of infection.  Her WBC is trending down. 5. Bipolar/schizoaffective disorder- Seroquel is temporarily being held in the setting of encephalopathy. 6. Goals of care.  Seen by palliative care services.  I had a long conversation with the patient's son, [name].  We discussed the patient's current state of health and expected prognosis.  Since she has progressive renal disease and is not a dialysis candidate, I have recommended hospice services.  Family is now accepting of this.  Arrangements are being made for hospice at home.  Hospital bed has been ordered..  DVT prophylaxis: SCDs Code Status: DNR, comfort care Family Communication: Discussed with son at the bedside Disposition Plan: Family has elected to take the patient home with hospice services.  Arrangements are being made for home hospice.  Anticipate that this can be set up in the next 48 hours.   Consultants:  Nephrology  Palliative care  Procedures:  N/A  Antimicrobials:  N/A  Subjective: Complains of some sore throat.  She continues to have some  abdominal pain.  Objective: Vitals:   04/02/19 0525 04/03/19 1938 04/04/19 1929 04/05/19 1618  BP: (!) 153/78   (!) 141/76  Pulse: 93   95  Resp: 16   17  Temp: 97.9 F (36.6 C)   98.5 F (36.9 C)  TempSrc: Oral   Oral  SpO2: 98% 92% 91% 95%  Weight:      Height:        Intake/Output Summary (Last 24 hours) at 04/05/2019 1942 Last data filed at 04/04/2019 2300 Gross per 24 hour  Intake -  Output 300 ml  Net -300 ml   Filed Weights   04/01/19 1807 04/02/19 0500  Weight: 77.1 kg 75.2 kg     REVIEW OF SYSTEMS  As per history otherwise all reviewed and reported negative  Exam:  General exam: Alert, awake, no distress Respiratory system: Clear to auscultation. Respiratory effort normal. Cardiovascular system:RRR. No murmurs, rubs, gallops. Gastrointestinal system: Abdomen is nondistended, soft and tender in periumbilical area. No organomegaly or masses felt. Normal bowel sounds heard. Central nervous system: No focal neurological deficits. Extremities: No C/C/E, +pedal pulses Skin: No rashes, lesions or ulcers Psychiatry: Mildly confused, she is not agitated.     Data Reviewed: Basic Metabolic Panel: Recent Labs  Lab 04/01/19 1839 04/02/19 0551  NA 141 144  K 4.8 4.9  CL 105 108  CO2 24 24  GLUCOSE 109* 97  BUN 120* 106*  CREATININE 7.49* 7.19*  CALCIUM 8.6* 8.5*   Liver Function Tests: No results for input(s): AST, ALT, ALKPHOS, BILITOT, PROT, ALBUMIN in the last 168 hours. No results for input(s): LIPASE, AMYLASE in the last 168 hours. No results  for input(s): AMMONIA in the last 168 hours. CBC: Recent Labs  Lab 04/01/19 1839 04/02/19 0551  WBC 13.1* 11.7*  NEUTROABS 9.7* 8.1*  HGB 10.7* 10.4*  HCT 36.4 35.3*  MCV 99.5 99.2  PLT 284 256   Cardiac Enzymes: No results for input(s): CKTOTAL, CKMB, CKMBINDEX, TROPONINI in the last 168 hours. CBG (last 3)  No results for input(s): GLUCAP in the last 72 hours. Recent Results (from the past 240  hour(s))  MRSA PCR Screening     Status: Abnormal   Collection Time: 04/01/19  8:59 PM  Result Value Ref Range Status   MRSA by PCR POSITIVE (A) NEGATIVE Final    Comment:        The GeneXpert MRSA Assay (FDA approved for NASAL specimens only), is one component of a comprehensive MRSA colonization surveillance program. It is not intended to diagnose MRSA infection nor to guide or monitor treatment for MRSA infections. RESULT CALLED TO, READ BACK BY AND VERIFIED WITH: R TATE,RN @2350  04/01/19 [facility] Performed at [facility], [address] [place], [place] [address]     Studies: No results found.   Scheduled Meds: . Chlorhexidine Gluconate Cloth  6 each Topical Q0600  . mupirocin ointment  1 application Nasal BID  . sodium chloride flush  3 mL Intravenous Q12H   Continuous Infusions:  Principal Problem:   Acute renal failure superimposed on stage 5 chronic kidney disease, not on chronic dialysis (HCC) Active Problems:   Essential hypertension   CAD (coronary artery disease)   Acute metabolic encephalopathy   Leukocytosis   Uremia   Pressure injury of skin   Palliative care by specialist   Goals of care, counseling/discussion   ESRD (end stage renal disease) ([place])  Time spent: 56mins  Kathie Dike, MD [facility] 04/05/2019, 7:42 PM    LOS: 4 days

## 2019-04-05 NOTE — Care Management Important Message (Signed)
Important Message  Patient Details  Name: Kristen Kaufman MRN: [id number] Date of Birth: 1931-06-23   Medicare Important Message Given:  Yes    Tommy Medal 04/05/2019, 4:04 PM

## 2019-04-05 NOTE — Progress Notes (Signed)
Daily Progress Note   Patient Name: Kristen Kaufman       Date: 04/05/2019 DOB: October 14, 1931  Age: 85 y.o. MRN#: [id number] Attending Physician: Kathie Dike, MD Primary Care Physician: Sherrilee Gilles, DO Admit Date: 04/01/2019  Reason for Consultation/Follow-up: Establishing goals of care and Terminal Care  Subjective: Patient wakes to voice but remains drowsy and minimally engaged in conversation. Denies pain or discomfort. She remembers that she is at the hospital. No s/s of distress or discomfort.   GOC:  F/u GOC with son, Lakeshia Dohner, at bedside.   Again reviewed course of hospitalization including diagnoses, interventions, and prognosis. He had a chance to speak with Dr. Ulice Bold yesterday and also further speak with his family about the plan of care.   The difference between aggressive medical intervention and comfort care was considered.  [name] shares the family decision to take his mother home with support of hospice services. He spoke with insurance company yesterday and has also arranged additional caregivers through insurance M-F. He plans to be available on the weekends. He also shares that he spoke with [facility] and whether or not they would perform comfort measures at SNF if the family needed additional assist, for which they shared this can be done. Explained that I would have RN CM follow-up with [name] about discharge plan for home with hospice services.   Introduced and discussed MOST form. After further discussion, decisions include: DNR/DNI, comfort measure including prevention of re-hospitalization, IVF/ABX for time trial if appropriate, and NO feeding tube. Durable DNR completed. Copies made for chart and family.   Again explained role of hospice services and philosophy  including focus on comfort, quality of life, dignity at EOL, and prevention of recurrent hospitalization with symptom management focus.   [name] appreciative of [facility] Cho[facility] booklet and information provided by care team.   Answered questions and concerns. PMT contact information given.    Length of Stay: 4  Current Medications: Scheduled Meds:  . Chlorhexidine Gluconate Cloth  6 each Topical Q0600  . mupirocin ointment  1 application Nasal BID  . sodium chloride flush  3 mL Intravenous Q12H    Continuous Infusions:   PRN Meds: acetaminophen **OR** acetaminophen, ondansetron **OR** ondansetron (ZOFRAN) IV  Physical Exam Vitals signs and nursing note reviewed.  Constitutional:      Appearance:  She is ill-appearing.  HENT:     Head: Normocephalic and atraumatic.  Pulmonary:     Effort: No tachypnea, accessory muscle usage or respiratory distress.  Abdominal:     Tenderness: There is no abdominal tenderness.  Skin:    General: Skin is warm and dry.  Neurological:     Mental Status: She is easily aroused.     Comments: Wakes to voice. Oriented to self and place, otherwise disoriented. Generalized weakness  Psychiatric:        Attention and Perception: She is inattentive.        Speech: Speech is delayed.        Cognition and Memory: Cognition is impaired.            Vital Signs: BP (!) 153/78 (BP Location: Left Arm)   Pulse 93   Temp 97.9 F (36.6 C) (Oral)   Resp 16   Ht 5\' 1"  (1.549 m)   Wt 75.2 kg   SpO2 91%   BMI 31.32 kg/m  SpO2: SpO2: 91 % O2 Device: O2 Device: Room Air O2 Flow Rate:    Intake/output summary:   Intake/Output Summary (Last 24 hours) at 04/05/2019 1112 Last data filed at 04/04/2019 2300 Gross per 24 hour  Intake -  Output 300 ml  Net -300 ml   LBM: Last BM Date: 04/04/19 Baseline Weight: Weight: 77.1 kg Most recent weight: Weight: 75.2 kg       Palliative Assessment/Data: PPS 30%     Patient Active Problem List   Diagnosis  Date Noted  . Palliative care by specialist   . Goals of care, counseling/discussion   . ESRD (end stage renal disease) ([place])   . Pressure injury of skin 04/02/2019  . Acute renal failure superimposed on stage 5 chronic kidney disease, not on chronic dialysis ([place]) 04/01/2019  . Acute metabolic encephalopathy 38/25/0539  . Leukocytosis 04/01/2019  . Uremia   . CAP (community acquired pneumonia) 03/01/2019  . Acute lower UTI 03/01/2019  . Rheumatoid arthritis ([place]) 06/12/2018  . Chest pain 06/12/2018  . Gout 02/13/2018  . CAD (coronary artery disease) 02/13/2018  . Constipation 06/13/2017  . AKI (acute kidney injury) ([place]) 05/30/2017  . UTI (urinary tract infection), uncomplicated 76/73/4193  . UTI (urinary tract infection) 05/25/2017  . Hyperkalemia 05/25/2017  . Acute renal failure superimposed on stage 4 chronic kidney disease ([place])   . Anemia in stage 4 chronic kidney disease ([place])   . Renal insufficiency 05/24/2017  . Diabetes mellitus type 2 in obese ([place]) 05/24/2017  . Essential hypertension 05/24/2017    Palliative Care Assessment & Plan   Patient Profile: 85 y.o. female  with past medical history of CKD stage V, DM, HTN, MI, bipolar disorder, schizoaffective disorder admitted on 04/01/2019 with worsening renal functions and confusion. Patient presented with confusion concerning for uremia. BUN/Creat 120/7.49. Gentle hydration initiated. Nephrology consulted and has discussed with family poor candidacy for dialysis. Family decision to not initiate dialysis and shift towards comfort focused care. Palliative medicine consultation for GOC/EOL care.   Assessment: Acute kidney injury superimposed on CKD stage V Uremia Acute metabolic encephalopathy Essential hypertension Leukocytosis Bipolar/schizoaffective disorder  Recommendations/Plan:  DNR  Family decision against initiation of dialysis understanding she is a poor candidate for dialysis.   Family decision to  initiate hospice services in their home setting. RN CM consulted.   MOST form completed with son. DNR/DNI, comfort measures only/prevent recurrent hospitalization, IVF/ABX for time trial if indicated, and NO feeding tube.  Durable DNR completed. Copies made for chart and family.   Continue current plan of care per attending. Home when hospice is arranged.  Code Status: DNR   Code Status Orders  (From admission, onward)         Start     Ordered   04/02/19 1523  Do not attempt resuscitation (DNR)  Continuous    Question Answer Comment  In the event of cardiac or respiratory ARREST Do not call a "code blue"   In the event of cardiac or respiratory ARREST Do not perform Intubation, CPR, defibrillation or ACLS   In the event of cardiac or respiratory ARREST Use medication by any route, position, wound care, and other measures to relive pain and suffering. May use oxygen, suction and manual treatment of airway obstruction as needed for comfort.      04/02/19 1522        Code Status History    Date Active Date Inactive Code Status Order ID Comments User Context   04/01/2019 2038 04/02/2019 1522 Full Code [id number]  Vianne Bulls, MD ED   03/01/2019 2221 03/07/2019 1656 Full Code [id number]  Truett Mainland, DO Inpatient   06/12/2018 0127 06/12/2018 1728 DNR [id number]  Heath Lark D, DO ED   02/13/2018 2356 02/15/2018 1927 DNR [id number]  Reubin Milan, MD ED   06/13/2017 1926 06/15/2017 1414 DNR [id number]  Reubin Milan, MD Inpatient   05/30/2017 2108 06/03/2017 1551 DNR [id number]  Reubin Milan, MD Inpatient   05/24/2017 2021 05/27/2017 1625 DNR [id number]  Karmen Bongo, MD Inpatient       Prognosis:   Poor long-term prognosis, possibly weeks with AKI superimposed on CKD stage V, worsening kidney functions, decreased urine output, and uremia. Family decision against initiation of dialysis and focus on comfort measures.  Discharge Planning:  Home with Hospice  Care plan  was discussed with RN, Dr. Roderic Palau, son Stanley Helmuth), SW  Thank you for allowing the Palliative Medicine Team to assist in the care of this patient.   Time In: 1020 Time Out: 1130 Total Time 70 Prolonged Time Billed yes      Greater than 50%  of this time was spent counseling and coordinating care related to the above assessment and plan.  Ihor Dow, FNP-C Palliative Medicine Team  Phone: [phone] Fax: [phone]  Please contact Palliative Medicine Team phone at [phone] for questions and concerns.

## 2019-04-06 MED ORDER — PANTOPRAZOLE SODIUM 40 MG PO TBEC
40.0000 mg | DELAYED_RELEASE_TABLET | Freq: Every day | ORAL | Status: DC
  Administered 2019-04-06 – 2019-04-07 (×2): 40 mg via ORAL
  Filled 2019-04-06 (×2): qty 1

## 2019-04-06 NOTE — Progress Notes (Signed)
PROGRESS NOTE   Kristen Kaufman  AXE:[id number]  DOB: 1931-01-11  DOA: 04/01/2019 PCP: Kristen Gilles, DO   Brief Admission Hx: 85 year old female with stage V CKD, diabetes mellitus, hypertension, gets affective disorder was sent from nursing home with worsening renal failure associated with confusion and presumed uremia.  MDM/Assessment & Plan:   1. Acute kidney injury on CKD stage V- patient presented with confusion which is concerning for uremia.  The ED contacted our nephrology team who recommended medical admission.  Seen by nephrology, not felt to be candidate for dialysis.  Family is in agreement with this. 2. Acute metabolic encephalopathy-likely secondary to uremia from acute on chronic renal failure.  She remains confused 3. Essential hypertension-poorly controlled, she has been resumed on amlodipine and will follow. 4. Leukocytosis-patient not having fever or signs of infection.  Her WBC is trending down. 5. Bipolar/schizoaffective disorder- Seroquel is temporarily being held in the setting of encephalopathy. 6. Goals of care.  Seen by palliative care services.  I had a long conversation with the patient's son, Kristen Kaufman.  We discussed the patient's current state of health and expected prognosis.  Since she has progressive renal disease and is not a dialysis candidate, I have recommended hospice services.  Family is now accepting of this.  Arrangements are being made for hospice at home.  Hospital bed has been ordered..  DVT prophylaxis: SCDs Code Status: DNR, comfort care Family Communication: Discussed with son at the bedside Disposition Plan: Family has elected to take the patient home with hospice services.  Arrangements are being made for home hospice.  Anticipate that this can be set up in the next 24hours.   Consultants:  Nephrology  Palliative care  Procedures:  N/A  Antimicrobials:  N/A  Subjective: Has some abdominal pain and chest discomfort.  Feels like his  acid reflux.  Objective: Vitals:   04/06/19 0559 04/06/19 0606 04/06/19 0958 04/06/19 1524  BP:  (!) 158/81 (!) 177/89 (!) 155/103  Pulse:  90 94 91  Resp:  18  17  Temp: 97.9 F (36.6 C) 98.7 F (37.1 C)  98.1 F (36.7 C)  TempSrc: Oral Oral  Oral  SpO2:   95% 98%  Weight:  74.9 kg    Height:       No intake or output data in the 24 hours ending 04/06/19 1804 Filed Weights   04/01/19 1807 04/02/19 0500 04/06/19 0606  Weight: 77.1 kg 75.2 kg 74.9 kg     REVIEW OF SYSTEMS  As per history otherwise all reviewed and reported negative  Exam:  General exam: Alert, awake, no distress Respiratory system: Clear bilaterally. Respiratory effort normal. Cardiovascular system:RRR. No murmurs, rubs, gallops. Gastrointestinal system: Soft, tender in periumbilical area. No organomegaly or masses felt. Normal bowel sounds heard. Central nervous system: No focal neurological deficits. Extremities: No C/C/E, +pedal pulses Skin: No rashes, lesions or ulcers Psychiatry: pleasant, calm    Data Reviewed: Basic Metabolic Panel: Recent Labs  Lab 04/01/19 1839 04/02/19 0551  NA 141 144  K 4.8 4.9  CL 105 108  CO2 24 24  GLUCOSE 109* 97  BUN 120* 106*  CREATININE 7.49* 7.19*  CALCIUM 8.6* 8.5*   Liver Function Tests: No results for input(s): AST, ALT, ALKPHOS, BILITOT, PROT, ALBUMIN in the last 168 hours. No results for input(s): LIPASE, AMYLASE in the last 168 hours. No results for input(s): AMMONIA in the last 168 hours. CBC: Recent Labs  Lab 04/01/19 1839 04/02/19 0551  WBC 13.1* 11.7*  NEUTROABS 9.7* 8.1*  HGB 10.7* 10.4*  HCT 36.4 35.3*  MCV 99.5 99.2  PLT 284 256   Cardiac Enzymes: No results for input(s): CKTOTAL, CKMB, CKMBINDEX, TROPONINI in the last 168 hours. CBG (last 3)  No results for input(s): GLUCAP in the last 72 hours. Recent Results (from the past 240 hour(s))  MRSA PCR Screening     Status: Abnormal   Collection Time: 04/01/19  8:59 PM  Result  Value Ref Range Status   MRSA by PCR POSITIVE (A) NEGATIVE Final    Comment:        The GeneXpert MRSA Assay (FDA approved for NASAL specimens only), is one component of a comprehensive MRSA colonization surveillance program. It is not intended to diagnose MRSA infection nor to guide or monitor treatment for MRSA infections. RESULT CALLED TO, READ BACK BY AND VERIFIED WITH: R TATE,RN @2350  04/01/19 [facility] Performed at [facility], [address], [place],  [address]     Studies: No results found.   Scheduled Meds: . Chlorhexidine Gluconate Cloth  6 each Topical Q0600  . sodium chloride flush  3 mL Intravenous Q12H   Continuous Infusions:  Principal Problem:   Acute renal failure superimposed on stage 5 chronic kidney disease, not on chronic dialysis ([facility]) Active Problems:   Essential hypertension   CAD (coronary artery disease)   Acute metabolic encephalopathy   Leukocytosis   Uremia   Pressure injury of skin   Palliative care by specialist   Goals of care, counseling/discussion   ESRD (end stage renal disease) ([place])  Time spent: 58mins  Kathie Dike, MD [facility] 04/06/2019, 6:04 PM    LOS: 5 days

## 2019-04-07 MED ORDER — BISACODYL 10 MG RE SUPP: 10.0000 mg | Freq: Every day | RECTAL | Status: DC | PRN

## 2019-04-07 MED ORDER — BISACODYL 10 MG RE SUPP: 10.0000 mg | Freq: Every day | RECTAL | 0 refills | Status: AC | PRN

## 2019-04-07 MED ORDER — ALUM & MAG HYDROXIDE-SIMETH 200-200-20 MG/5ML PO SUSP: 30.0000 mL | Freq: Four times a day (QID) | ORAL | 0 refills | Status: AC | PRN

## 2019-04-07 MED ORDER — POLYETHYLENE GLYCOL 3350 17 G PO PACK: 17.0000 g | PACK | Freq: Every day | ORAL | 0 refills | Status: AC | PRN

## 2019-04-07 MED ORDER — POLYETHYLENE GLYCOL 3350 17 G PO PACK: 17.0000 g | PACK | Freq: Every day | ORAL | Status: DC | PRN

## 2019-04-07 MED ORDER — ONDANSETRON HCL 4 MG PO TABS: 4.0000 mg | ORAL_TABLET | Freq: Four times a day (QID) | ORAL | 0 refills | Status: AC | PRN

## 2019-04-07 MED ORDER — PANTOPRAZOLE SODIUM 40 MG PO TBEC: 40.0000 mg | DELAYED_RELEASE_TABLET | Freq: Every day | ORAL | 0 refills | Status: AC

## 2019-04-07 MED ORDER — MORPHINE SULFATE (CONCENTRATE) 10 MG/0.5ML PO SOLN: 10.0000 mg | ORAL | 0 refills | Status: AC | PRN

## 2019-04-07 NOTE — Discharge Summary (Signed)
Physician Discharge Summary  Kristen Kaufman BZJ:[id number] DOB: [birth year] DOA: 04/01/2019  PCP: Kristen Gilles, DO  Admit date: 04/01/2019 Discharge date: 04/07/2019  Admitted From: SNF[facility] in [place] Disposition:  Home with hospice  Recommendations for Outpatient Follow-up:  1. Patient will be discharged home with hospice services for end of life care  Discharge Condition:hospice CODE STATUS:DNR Diet recommendation: regular diet for comfort  Brief/Interim Summary: 85 year old female with stage V CKD, diabetes mellitus, hypertension, schizoaffective disorder was sent from nursing home with worsening renal failure associated with confusion and presumed uremia.  Discharge Diagnoses:  Principal Problem:   Acute renal failure superimposed on stage 5 chronic kidney disease, not on chronic dialysis [name]) Active Problems:   Essential hypertension   CAD (coronary artery disease)   Acute metabolic encephalopathy   Leukocytosis   Uremia   Pressure injury of skin   Palliative care by specialist   Goals of care, counseling/discussion  1. Acute kidney injury on chronic kidney disease stage V.  Patient presented with confusion, concerning for uremia.  She was seen by the nephrology team.  After their evaluation, it was felt that she was not a candidate for dialysis.  It was noted that patient would not want dialysis as she had discussed with her family members in the past.  Several conversations were had regarding goals of care and quality of life.  Palliative care was involved in these conversations which eventually led to the decision to take patient home with hospice services.  Focus of care was shifted towards quality of life. 2. Acute metabolic encephalopathy.  Felt to be secondary to uremia.  Overall she has improved. 3. Hypertension.  Poorly controlled. 4. Leukocytosis.  No signs of infection. 5. Bipolar/schizoaffective disorder. She was previously taking seroquel, which was held  in light of her worsening renal function  Discharge Instructions  Discharge Instructions    Diet - low sodium heart healthy   Complete by:  As directed    Increase activity slowly   Complete by:  As directed      Allergies as of 04/07/2019      Reactions   Influenza Vaccines Anaphylaxis   Menactra [meningococcal A C Y&w-135 Conj]       Medication List    STOP taking these medications   amLODipine 10 MG tablet Commonly known as:  NORVASC   busPIRone 5 MG tablet Commonly known as:  BUSPAR   calcium acetate (Phos Binder) 667 MG/5ML Soln Commonly known as:  PHOSLYRA   colchicine 0.6 MG tablet   docusate sodium 100 MG capsule Commonly known as:  COLACE   hydrocortisone 25 MG suppository Commonly known as:  ANUSOL-HC   multivitamin Tabs tablet   patiromer 8.4 g packet Commonly known as:  VELTASSA   QUEtiapine 25 MG tablet Commonly known as:  SEROQUEL   sodium bicarbonate 650 MG tablet   traMADol 50 MG tablet Commonly known as:  ULTRAM     TAKE these medications   acetaminophen 325 MG tablet Commonly known as:  TYLENOL Take 2 tablets (650 mg total) by mouth every 6 (six) hours as needed for fever or headache (pain).   alum & mag hydroxide-simeth 200-200-20 MG/5ML suspension Commonly known as:  MAALOX/MYLANTA Take 30 mLs by mouth every 6 (six) hours as needed for indigestion or heartburn.   bisacodyl 10 MG suppository Commonly known as:  DULCOLAX Place 1 suppository (10 mg total) rectally daily as needed for moderate constipation.   morphine CONCENTRATE 10 MG/0.5ML Soln concentrated  solution Take 0.5 mLs (10 mg total) by mouth every 2 (two) hours as needed for severe pain.   ondansetron 4 MG tablet Commonly known as:  ZOFRAN Take 1 tablet (4 mg total) by mouth every 6 (six) hours as needed for nausea.   pantoprazole 40 MG tablet Commonly known as:  PROTONIX Take 1 tablet (40 mg total) by mouth daily. Start taking on:  April 08, 2019   polyethylene  glycol 17 g packet Commonly known as:  MIRALAX / GLYCOLAX Take 17 g by mouth daily as needed for mild constipation.            Durable Medical Equipment  (From admission, onward)         Start     Ordered   04/05/19 1945  For home use only DME Hospital bed  Once    Question Answer Comment  Patient has (list medical condition): end stage renal disease, failure to thrive   Bed type Semi-electric      04/05/19 1945          Allergies  Allergen Reactions  . Influenza Vaccines Anaphylaxis  . Menactra [Meningococcal A C Y&W-135 Conj]     Consultations:  Nephrology  Palliative care   Procedures/Studies: Ct Abdomen Pelvis Wo Contrast  Result Date: 03/18/2019 CLINICAL DATA:  Left-sided abdominal pain. EXAM: CT ABDOMEN AND PELVIS WITHOUT CONTRAST TECHNIQUE: Multidetector CT imaging of the abdomen and pelvis was performed following the standard protocol without IV contrast. COMPARISON:  CT abdomen pelvis dated March 01, 2019. FINDINGS: Lower chest: New bibasilar atelectasis. Unchanged trace pericardial effusion. Hepatobiliary: No focal liver abnormality. Unchanged cholelithiasis. No biliary dilatation. Pancreas: Unremarkable. No pancreatic ductal dilatation or surrounding inflammatory changes. Spleen: Normal in size without focal abnormality. Adrenals/Urinary Tract: Adrenal glands are unremarkable. Unchanged simple cyst in the upper pole the left kidney. No renal calculi or hydronephrosis. Bladder is unremarkable. Stomach/Bowel: Stomach is within normal limits. Appendix is normal. No evidence of bowel wall thickening, distention, or inflammatory changes. Unchanged moderate colonic diverticulosis. Mild stool burden throughout the colon. Increasing stool ball in the rectum. Vascular/Lymphatic: Aortic atherosclerosis. No enlarged abdominal or pelvic lymph nodes. Reproductive: Status post hysterectomy. No adnexal masses. Other: New mild presacral soft tissue stranding, nonspecific. No free  fluid or pneumoperitoneum. Musculoskeletal: No acute or significant osseous findings. IMPRESSION: 1. Increasing stool ball within the rectum with new presacral soft tissue stranding, potentially reflecting stercoral proctitis. 2. Unchanged cholelithiasis. 3.  Aortic atherosclerosis (ICD10-I70.0). Electronically Signed   By: Titus Dubin M.D.   On: 03/18/2019 17:51   Dg Chest 2 View  Result Date: 03/18/2019 CLINICAL DATA:  Recent hospitalization for kidney failure. Left-sided chest pain beginning last night. EXAM: CHEST - 2 VIEW COMPARISON:  03/03/2019 FINDINGS: Lungs are somewhat hypoinflated with minimal stable linear density left midlung likely atelectasis/scarring. Opacification over the mid to posterior lung bases on the lateral film which may be due to atelectasis or infection. Mild stable cardiomegaly. Remainder of the exam is unchanged. IMPRESSION: Mild opacification over the mid to posterior lung bases on the lateral film which may be due to atelectasis or infection. Electronically Signed   By: Marin Olp M.D.   On: 03/18/2019 16:33   Ct Head Wo Contrast  Result Date: 03/18/2019 CLINICAL DATA:  Recently hospitalized for kidney failure. Altered mental status. EXAM: CT HEAD WITHOUT CONTRAST TECHNIQUE: Contiguous axial images were obtained from the base of the skull through the vertex without intravenous contrast. COMPARISON:  None. FINDINGS: Brain: Cavum septum variant  is present. There is mild age related atrophic change as the ventricles and cisterns are otherwise unremarkable. There is moderate chronic ischemic microvascular disease. There is no mass, mass effect, shift of midline structures or acute hemorrhage. Small old lacunar infarct over the left basal ganglia. No acute infarction. Vascular: No hyperdense vessel or unexpected calcification. Skull: Normal. Negative for fracture or focal lesion. Sinuses/Orbits: Orbits are normal. Mild opacification over the right maxillary sinus. Mastoid  air cells are clear. Other: None. IMPRESSION: No acute findings. Moderate chronic ischemic microvascular disease. Small old lacunar infarct left basal ganglia. Age related atrophy. Minimal chronic sinus inflammatory change. Electronically Signed   By: Marin Olp M.D.   On: 03/18/2019 17:41   US Venous Img Lower  Left (dvt Study)  Result Date: 03/18/2019 CLINICAL DATA:  85 year old female with leg pain EXAM: LEFT LOWER EXTREMITY VENOUS DOPPLER ULTRASOUND TECHNIQUE: Gray-scale sonography with graded compression, as well as color Doppler and duplex ultrasound were performed to evaluate the lower extremity deep venous systems from the level of the common femoral vein and including the common femoral, femoral, profunda femoral, popliteal and calf veins including the posterior tibial, peroneal and gastrocnemius veins when visible. The superficial great saphenous vein was also interrogated. Spectral Doppler was utilized to evaluate flow at rest and with distal augmentation maneuvers in the common femoral, femoral and popliteal veins. COMPARISON:  None. FINDINGS: Contralateral Common Femoral Vein: Respiratory phasicity is normal and symmetric with the symptomatic side. No evidence of thrombus. Normal compressibility. Common Femoral Vein: No evidence of thrombus. Normal compressibility, respiratory phasicity and response to augmentation. Saphenofemoral Junction: No evidence of thrombus. Normal compressibility and flow on color Doppler imaging. Profunda Femoral Vein: No evidence of thrombus. Normal compressibility and flow on color Doppler imaging. Femoral Vein: No evidence of thrombus. Normal compressibility, respiratory phasicity and response to augmentation. Popliteal Vein: No evidence of thrombus. Normal compressibility, respiratory phasicity and response to augmentation. Calf Veins: No evidence of thrombus. Normal compressibility and flow on color Doppler imaging. Superficial Great Saphenous Vein: No evidence of  thrombus. Normal compressibility and flow on color Doppler imaging. Other Findings:  Edema IMPRESSION: Sonographic survey of the left lower extremity negative for DVT. Left lower extremity edema Electronically Signed   By: Corrie Mckusick D.O.   On: 03/18/2019 17:07   Dg Chest Port 1 View  Result Date: 04/01/2019 CLINICAL DATA:  Weakness EXAM: PORTABLE CHEST 1 VIEW COMPARISON:  03/18/2019 FINDINGS: There is bilateral perihilar linear airspace disease likely reflecting atelectasis. There is no focal parenchymal opacity. There is no pleural effusion or pneumothorax. The heart and mediastinal contours are unremarkable. The osseous structures are unremarkable. IMPRESSION: No active disease. Electronically Signed   By: Kathreen Devoid   On: 04/01/2019 19:30   Dg Knee Complete 4 Views Left  Result Date: 03/18/2019 CLINICAL DATA:  Left knee pain. EXAM: LEFT KNEE - COMPLETE 4+ VIEW COMPARISON:  02/15/2018 FINDINGS: Moderate osteoarthritic change of the left knee most prominent over the medial compartment with slight interval progression. No evidence of acute fracture or dislocation. No significant joint effusion. IMPRESSION: No acute findings. Moderate osteoarthritis most prominent over the medial compartment with slight interval progression. Electronically Signed   By: Marin Olp M.D.   On: 03/18/2019 16:31      Subjective: Complains of some abdominal pain. No vomiting. Last bowel movement was yesterday  Discharge Exam: Vitals:   04/06/19 1524 04/06/19 2148 04/07/19 0607 04/07/19 0757  BP: (!) 155/103 (!) 162/89 137/87   Pulse: 91  90 (!) 101   Resp: 17 18 18    Temp: 98.1 F (36.7 C) 98 F (36.7 C) 98.2 F (36.8 C)   TempSrc: Oral Oral Oral   SpO2: 98% 96%  96%  Weight:      Height:        General: Pt is alert, awake, not in acute distress Cardiovascular: RRR, S1/S2 +, no rubs, no gallops Respiratory: CTA bilaterally, no wheezing, no rhonchi Abdominal: Soft, tender in periumbilical region,  ND, bowel sounds + Extremities: no edema, no cyanosis    The results of significant diagnostics from this hospitalization (including imaging, microbiology, ancillary and laboratory) are listed below for reference.     Microbiology: Recent Results (from the past 240 hour(s))  MRSA PCR Screening     Status: Abnormal   Collection Time: 04/01/19  8:59 PM  Result Value Ref Range Status   MRSA by PCR POSITIVE (A) NEGATIVE Final    Comment:        The GeneXpert MRSA Assay (FDA approved for NASAL specimens only), is one component of a comprehensive MRSA colonization surveillance program. It is not intended to diagnose MRSA infection nor to guide or monitor treatment for MRSA infections. RESULT CALLED TO, READ BACK BY AND VERIFIED WITH: R TATE,RN @2350  04/01/19 [facility] Performed at [facility], [address] [place],  [address]      Labs: BNP (last 3 results) No results for input(s): BNP in the last 8760 hours. Basic Metabolic Panel: Recent Labs  Lab 04/01/19 1839 04/02/19 0551  NA 141 144  K 4.8 4.9  CL 105 108  CO2 24 24  GLUCOSE 109* 97  BUN 120* 106*  CREATININE 7.49* 7.19*  CALCIUM 8.6* 8.5*   Liver Function Tests: No results for input(s): AST, ALT, ALKPHOS, BILITOT, PROT, ALBUMIN in the last 168 hours. No results for input(s): LIPASE, AMYLASE in the last 168 hours. No results for input(s): AMMONIA in the last 168 hours. CBC: Recent Labs  Lab 04/01/19 1839 04/02/19 0551  WBC 13.1* 11.7*  NEUTROABS 9.7* 8.1*  HGB 10.7* 10.4*  HCT 36.4 35.3*  MCV 99.5 99.2  PLT 284 256   Cardiac Enzymes: No results for input(s): CKTOTAL, CKMB, CKMBINDEX, TROPONINI in the last 168 hours. BNP: Invalid input(s): POCBNP CBG: No results for input(s): GLUCAP in the last 168 hours. D-Dimer No results for input(s): DDIMER in the last 72 hours. Hgb A1c No results for input(s): HGBA1C in the last 72 hours. Lipid Profile No results for input(s): CHOL, HDL, LDLCALC,  TRIG, CHOLHDL, LDLDIRECT in the last 72 hours. Thyroid function studies No results for input(s): TSH, T4TOTAL, T3FREE, THYROIDAB in the last 72 hours.  Invalid input(s): FREET3 Anemia work up No results for input(s): VITAMINB12, FOLATE, FERRITIN, TIBC, IRON, RETICCTPCT in the last 72 hours. Urinalysis    Component Value Date/Time   COLORURINE YELLOW 03/18/2019 1728   APPEARANCEUR CLEAR 03/18/2019 1728   LABSPEC 1.009 03/18/2019 1728   PHURINE 6.0 03/18/2019 1728   GLUCOSEU NEGATIVE 03/18/2019 1728   HGBUR SMALL (A) 03/18/2019 1728   BILIRUBINUR NEGATIVE 03/18/2019 1728   KETONESUR NEGATIVE 03/18/2019 1728   PROTEINUR 100 (A) 03/18/2019 1728   NITRITE NEGATIVE 03/18/2019 1728   LEUKOCYTESUR NEGATIVE 03/18/2019 1728   Sepsis Labs Invalid input(s): PROCALCITONIN,  WBC,  LACTICIDVEN Microbiology Recent Results (from the past 240 hour(s))  MRSA PCR Screening     Status: Abnormal   Collection Time: 04/01/19  8:59 PM  Result Value Ref Range Status  MRSA by PCR POSITIVE (A) NEGATIVE Final    Comment:        The GeneXpert MRSA Assay (FDA approved for NASAL specimens only), is one component of a comprehensive MRSA colonization surveillance program. It is not intended to diagnose MRSA infection nor to guide or monitor treatment for MRSA infections. RESULT CALLED TO, READ BACK BY AND VERIFIED WITH: R TATE,RN @2350  04/01/19 [facility] Performed at [facility], [address], [place], [place] [address]      Time coordinating discharge: 64mins  SIGNED:   Kathie Dike, MD  [facility] 04/07/2019, 12:00 PM   If 7PM-7AM, please contact night-coverage [email or web address]

## 2019-04-08 NOTE — TOC Transition Note (Signed)
Transition of Care [facility]) - CM/SW Discharge Note   Patient Details  Name: Kristen Kaufman MRN: [id number] Date of Birth: 11-10-1931  [facility]) CM/SW Contact:  Trish Mage, LCSW Phone Number: 04/08/2019, 8:56 AM   Clinical Narrative:   Spoke with son, who identified plan as mother returning home with SO and starting services with [facility].  Spoke with Junie Panning at [facility], who requested additional information and stated they could start services with patient on Sunday, 4/19.  Alerted Dr and son.  Dr stated patient would be discharged on Sunday.    Final next level of care: Home w Hospice Care Barriers to Discharge: Barriers resolved   Patient Goals and CMS Choice Patient states their goals for this hospitalization and ongoing recovery are:: Unable to verbalize      Discharge Placement                Patient to be transferred to facility by: [facility] Name of family member notified: Bernice, [place], [phone] Patient and family notified of of transfer: 04/05/19  Discharge Plan and Services In-house Referral: Clinical Social Work, Hospice / [place] Acute Care Choice: Hospice                    Social Determinants of Health (SDOH) Interventions     Readmission Risk Interventions Readmission Risk Prevention Plan 03/06/2019  Transportation Screening Complete  PCP or Specialist Appt within 3-5 Days Complete  HRI or [place] Complete  Social Work Consult for [place] Planning/Counseling Complete  Palliative Care Screening Complete  Medication Review [employer]) Complete

## 2019-05-14 ENCOUNTER — Ambulatory Visit: Payer: 59 | Admitting: Student

## 2019-05-20 DEATH — deceased

## 2020-10-04 IMAGING — DX CHEST - 2 VIEW
2 series · 2 of 2 positions shown · non-contrast
Comparison: October 30, 2018

CLINICAL DATA: Pain.  Hypertension.

EXAM:
CHEST - 2 VIEW

[chest lat]
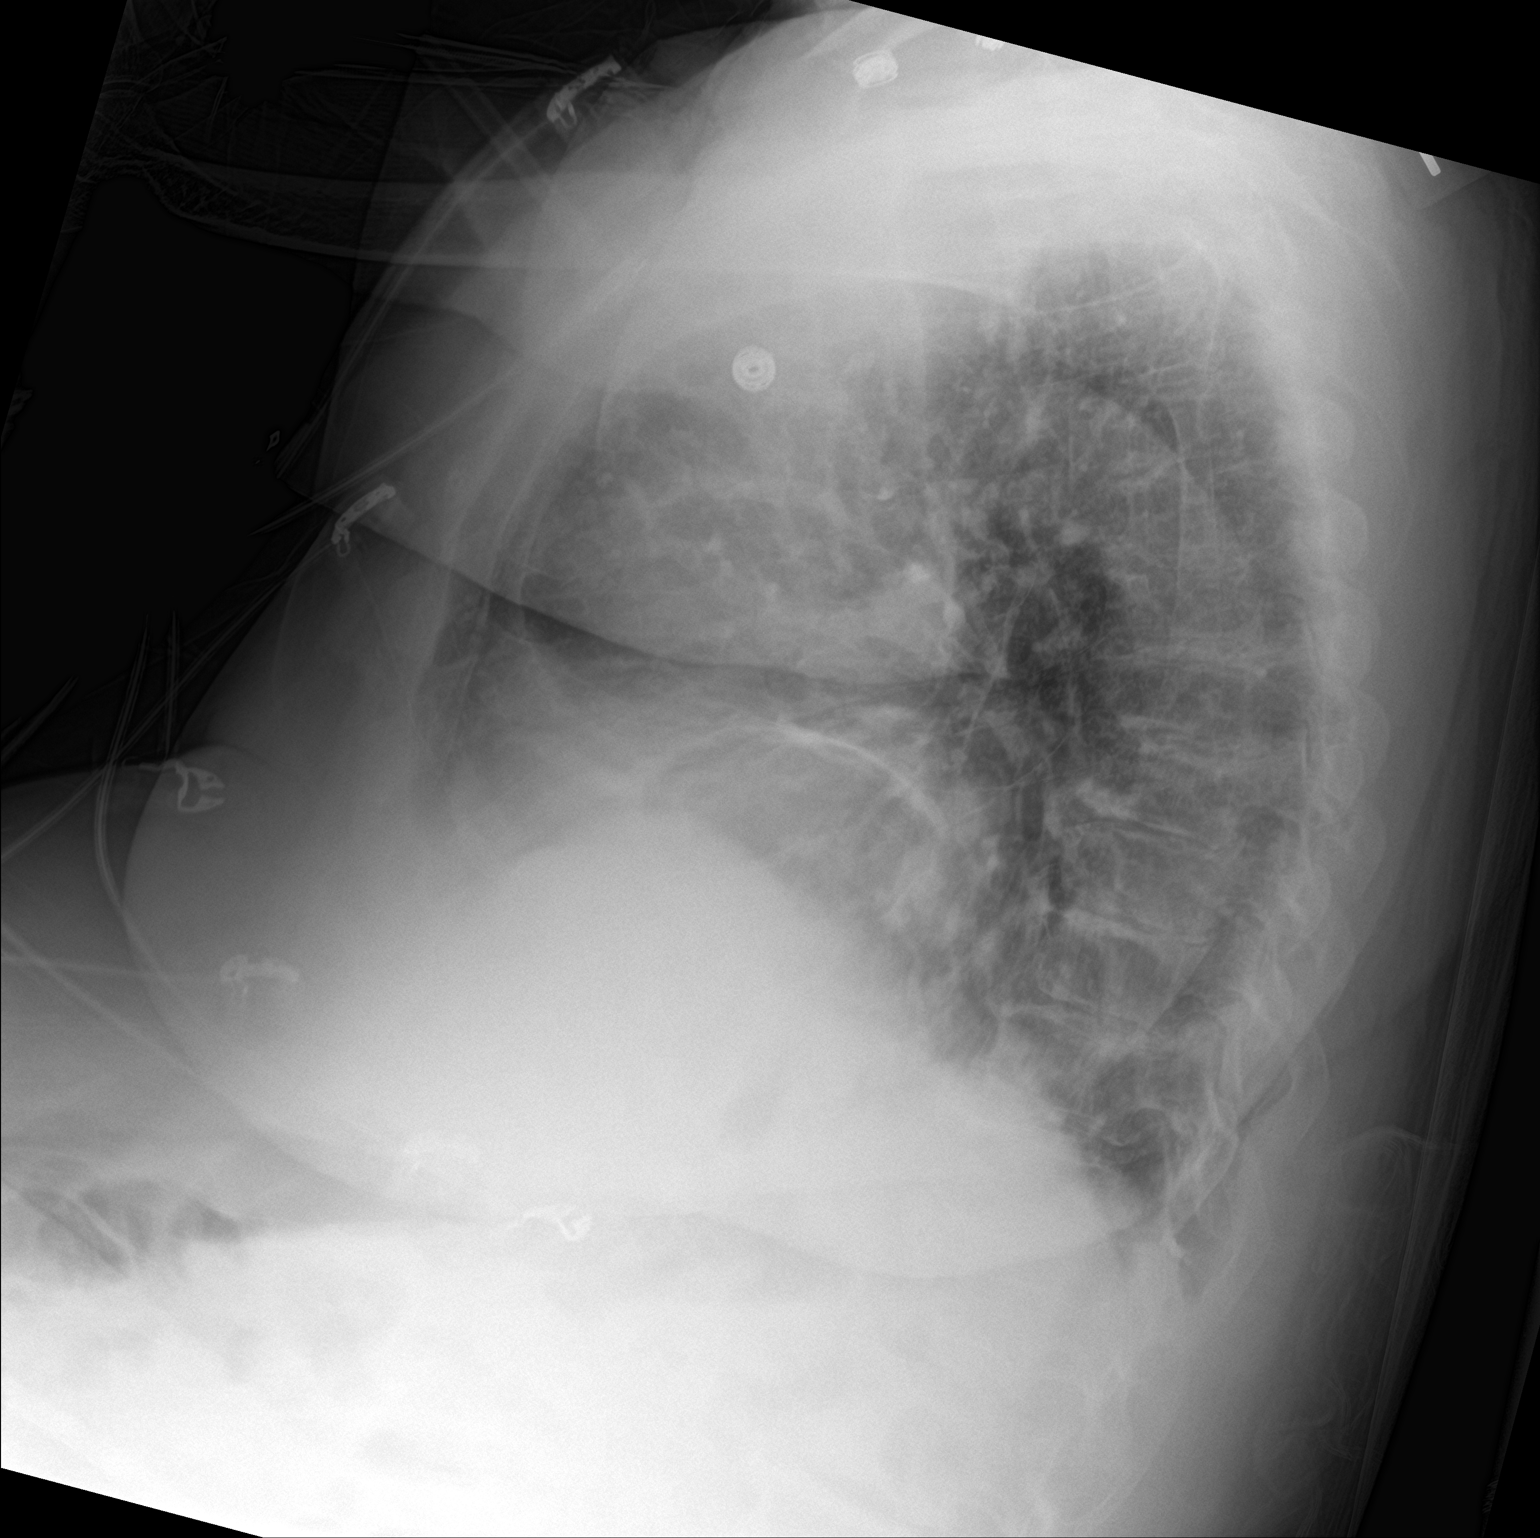

[chest ap]
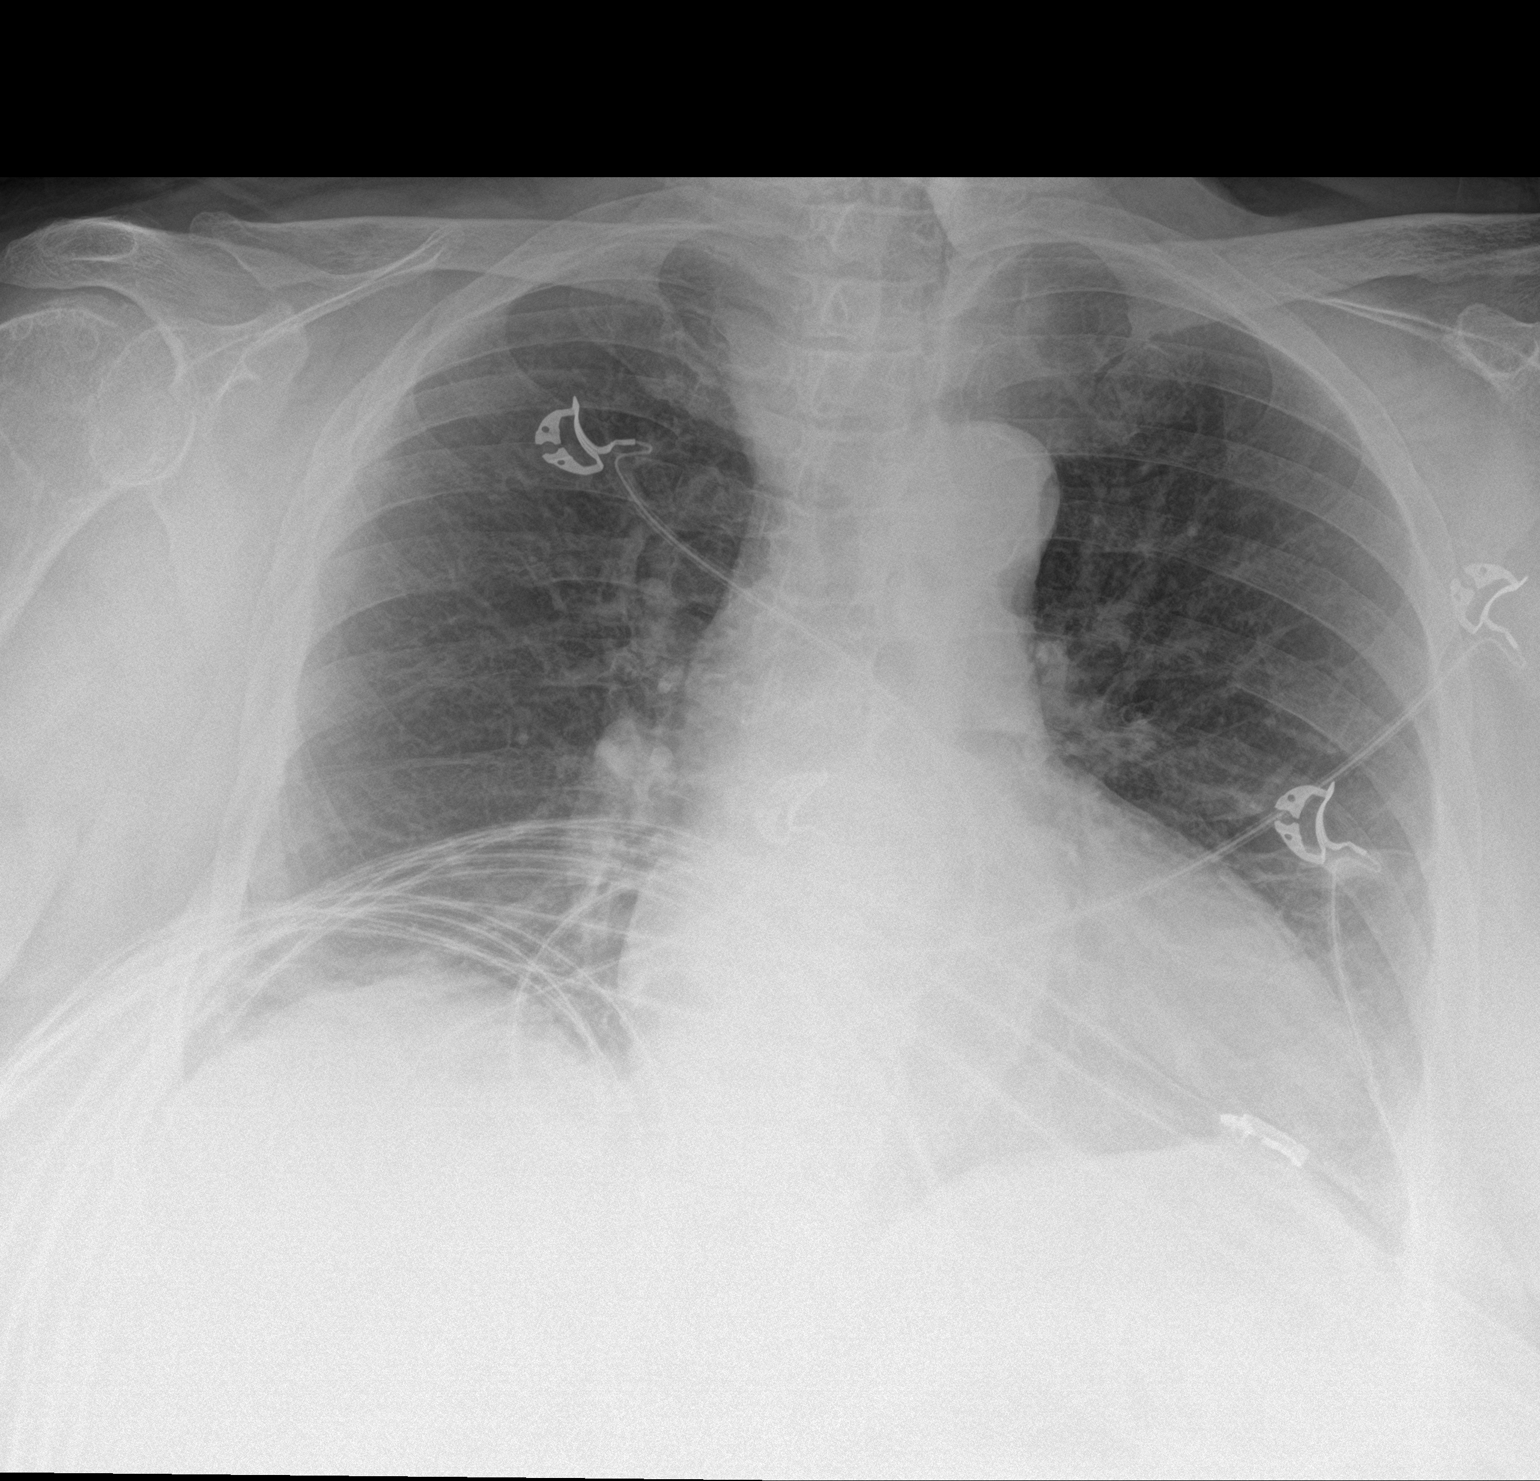

[2 of 2 positions shown; findings below may reference images not displayed]

FINDINGS: There is scarring in the lower lung zones bilaterally. There is
slight increased opacity in the right base compared to most recent
study. Heart is enlarged with pulmonary vascularity normal. No
adenopathy. There is aortic atherosclerosis. No bone lesions.
IMPRESSION: Scarring in each lower lobe. Question subtle pneumonia superimposed
on scarring in the right base.

There is stable cardiac prominence. Aortic Atherosclerosis
([id number]).

## 2020-10-06 IMAGING — DX CHEST - 2 VIEW
2 series · 2 of 2 positions shown · non-contrast
Comparison: 03/01/2019 and 10/30/2018

CLINICAL DATA: Dyspnea

EXAM:
CHEST - 2 VIEW

[chest ap]
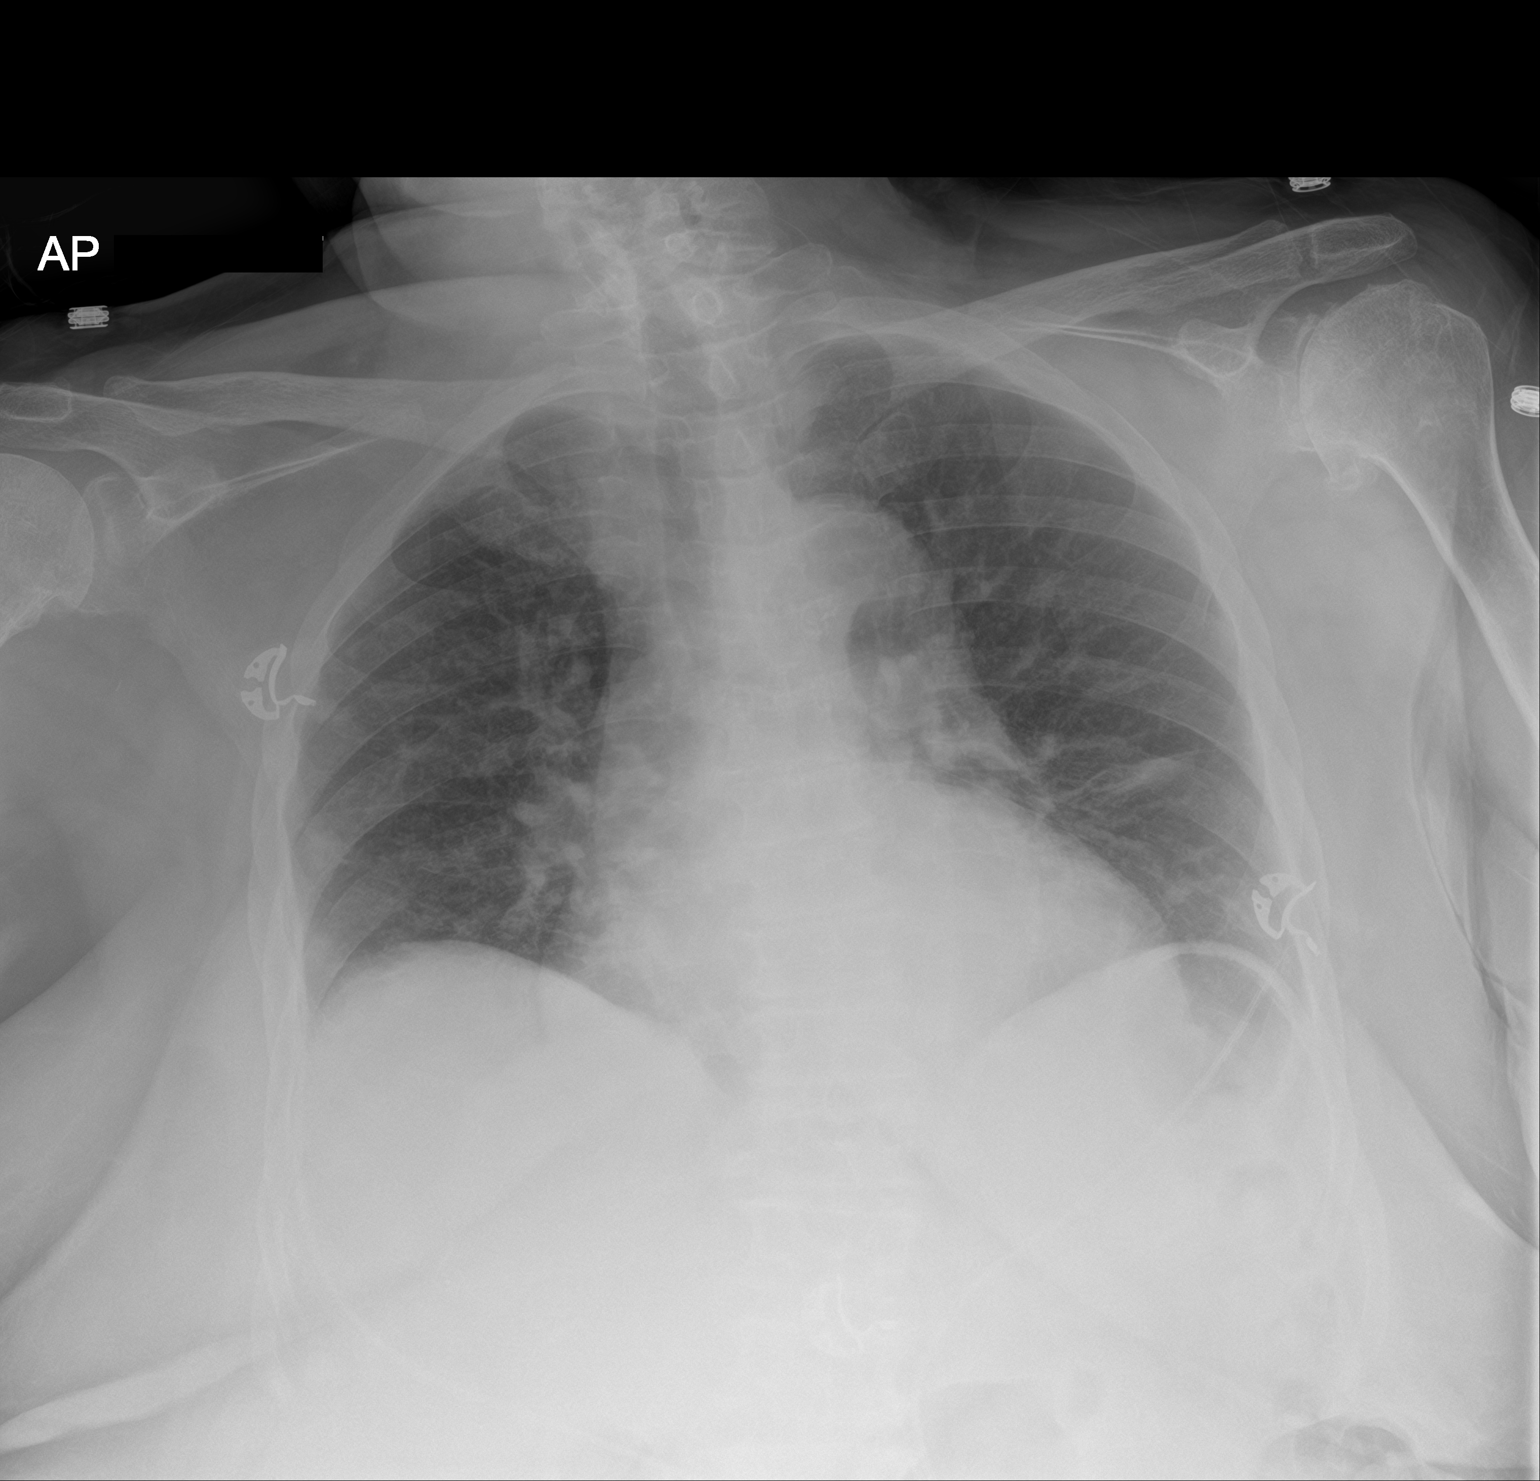

[chest lat]
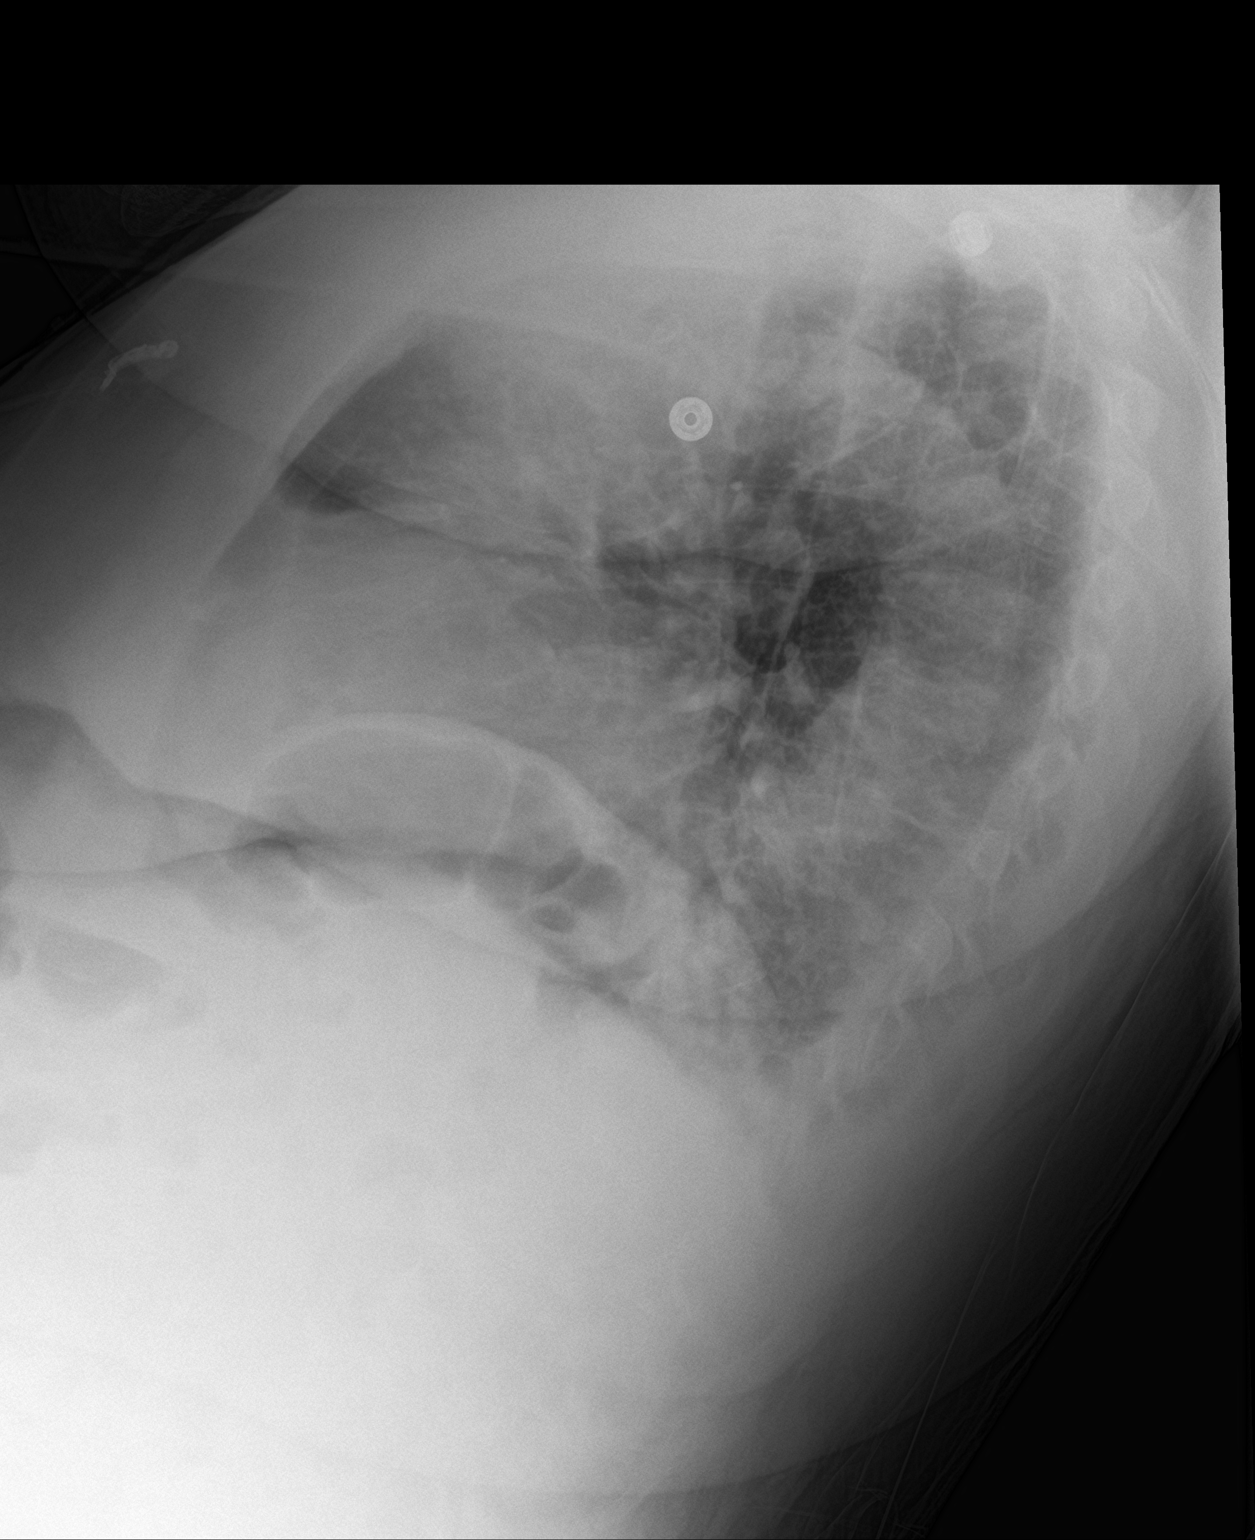

[2 of 2 positions shown; findings below may reference images not displayed]

FINDINGS: Mild lingular scarring. Additional mild opacities in the bilateral
lower lungs favor scarring/atelectasis, especially when correlating
with 5085, less likely mild infection. No frank interstitial edema.
No pleural effusion or pneumothorax.

Cardiomegaly.
IMPRESSION: Mild lingular scarring. Suspected bilateral lower lobe
scarring/atelectasis.
